# Patient Record
Sex: Male | Born: 1945 | Race: White | Hispanic: No | Marital: Married | Smoking: Never smoker
Health system: Southern US, Community
[De-identification: ages and names within clinical notes are randomized; demographics above are authoritative.]

## PROBLEM LIST (undated history)

## (undated) DIAGNOSIS — M549 Dorsalgia, unspecified: Secondary | ICD-10-CM

## (undated) DIAGNOSIS — M1990 Unspecified osteoarthritis, unspecified site: Secondary | ICD-10-CM

## (undated) DIAGNOSIS — I480 Paroxysmal atrial fibrillation: Secondary | ICD-10-CM

## (undated) DIAGNOSIS — K219 Gastro-esophageal reflux disease without esophagitis: Secondary | ICD-10-CM

## (undated) DIAGNOSIS — E119 Type 2 diabetes mellitus without complications: Secondary | ICD-10-CM

## (undated) DIAGNOSIS — I499 Cardiac arrhythmia, unspecified: Secondary | ICD-10-CM

## (undated) DIAGNOSIS — E785 Hyperlipidemia, unspecified: Secondary | ICD-10-CM

## (undated) DIAGNOSIS — I10 Essential (primary) hypertension: Secondary | ICD-10-CM

## (undated) DIAGNOSIS — C801 Malignant (primary) neoplasm, unspecified: Secondary | ICD-10-CM

## (undated) DIAGNOSIS — R911 Solitary pulmonary nodule: Secondary | ICD-10-CM

## (undated) DIAGNOSIS — G4730 Sleep apnea, unspecified: Secondary | ICD-10-CM

## (undated) DIAGNOSIS — G8929 Other chronic pain: Secondary | ICD-10-CM

## (undated) DIAGNOSIS — R609 Edema, unspecified: Secondary | ICD-10-CM

## (undated) DIAGNOSIS — Z7901 Long term (current) use of anticoagulants: Secondary | ICD-10-CM

## (undated) HISTORY — DX: Long term (current) use of anticoagulants: Z79.01

## (undated) HISTORY — DX: Edema, unspecified: R60.9

## (undated) HISTORY — PX: TONSILLECTOMY: SUR1361

## (undated) HISTORY — DX: Hyperlipidemia, unspecified: E78.5

## (undated) HISTORY — DX: Solitary pulmonary nodule: R91.1

## (undated) HISTORY — DX: Paroxysmal atrial fibrillation: I48.0

## (undated) HISTORY — DX: Essential (primary) hypertension: I10

## (undated) HISTORY — DX: Type 2 diabetes mellitus without complications: E11.9

## (undated) HISTORY — DX: Other chronic pain: G89.29

## (undated) HISTORY — DX: Sleep apnea, unspecified: G47.30

## (undated) HISTORY — DX: Dorsalgia, unspecified: M54.9

---

## 1996-03-06 HISTORY — PX: APPENDECTOMY: SHX54

## 2002-09-23 ENCOUNTER — Encounter: Payer: Self-pay | Admitting: Family Medicine

## 2002-09-23 ENCOUNTER — Inpatient Hospital Stay (HOSPITAL_COMMUNITY): Admission: AD | Admit: 2002-09-23 | Discharge: 2002-09-26 | Payer: Self-pay | Admitting: Family Medicine

## 2002-09-25 ENCOUNTER — Encounter: Payer: Self-pay | Admitting: Family Medicine

## 2004-01-27 ENCOUNTER — Ambulatory Visit: Payer: Self-pay | Admitting: *Deleted

## 2004-03-01 ENCOUNTER — Ambulatory Visit: Payer: Self-pay | Admitting: *Deleted

## 2004-03-29 ENCOUNTER — Ambulatory Visit: Payer: Self-pay | Admitting: *Deleted

## 2004-04-22 ENCOUNTER — Ambulatory Visit: Payer: Self-pay | Admitting: *Deleted

## 2004-05-25 ENCOUNTER — Ambulatory Visit: Payer: Self-pay | Admitting: *Deleted

## 2004-06-24 ENCOUNTER — Ambulatory Visit: Payer: Self-pay | Admitting: *Deleted

## 2004-07-25 ENCOUNTER — Ambulatory Visit: Payer: Self-pay | Admitting: *Deleted

## 2004-08-17 ENCOUNTER — Ambulatory Visit: Payer: Self-pay | Admitting: *Deleted

## 2004-09-28 ENCOUNTER — Ambulatory Visit: Payer: Self-pay | Admitting: *Deleted

## 2004-10-31 ENCOUNTER — Ambulatory Visit: Payer: Self-pay | Admitting: *Deleted

## 2004-11-28 ENCOUNTER — Ambulatory Visit: Payer: Self-pay | Admitting: Cardiology

## 2004-12-26 ENCOUNTER — Ambulatory Visit: Payer: Self-pay | Admitting: *Deleted

## 2005-01-30 ENCOUNTER — Ambulatory Visit: Payer: Self-pay | Admitting: *Deleted

## 2005-02-28 ENCOUNTER — Ambulatory Visit: Payer: Self-pay | Admitting: Internal Medicine

## 2005-04-03 ENCOUNTER — Ambulatory Visit: Payer: Self-pay | Admitting: *Deleted

## 2005-05-16 ENCOUNTER — Ambulatory Visit: Payer: Self-pay | Admitting: Cardiology

## 2005-05-31 ENCOUNTER — Emergency Department (HOSPITAL_COMMUNITY): Admission: EM | Admit: 2005-05-31 | Discharge: 2005-05-31 | Payer: Self-pay | Admitting: Emergency Medicine

## 2005-06-01 ENCOUNTER — Ambulatory Visit: Payer: Self-pay | Admitting: *Deleted

## 2005-06-03 ENCOUNTER — Emergency Department (HOSPITAL_COMMUNITY): Admission: EM | Admit: 2005-06-03 | Discharge: 2005-06-03 | Payer: Self-pay | Admitting: Emergency Medicine

## 2005-06-20 ENCOUNTER — Ambulatory Visit: Payer: Self-pay | Admitting: *Deleted

## 2005-07-06 ENCOUNTER — Ambulatory Visit: Payer: Self-pay | Admitting: *Deleted

## 2005-07-17 ENCOUNTER — Ambulatory Visit: Payer: Self-pay | Admitting: Cardiology

## 2005-08-18 ENCOUNTER — Ambulatory Visit: Payer: Self-pay | Admitting: Cardiology

## 2005-09-12 ENCOUNTER — Ambulatory Visit: Payer: Self-pay | Admitting: Cardiology

## 2005-10-10 ENCOUNTER — Ambulatory Visit: Payer: Self-pay | Admitting: *Deleted

## 2005-11-13 ENCOUNTER — Ambulatory Visit: Payer: Self-pay | Admitting: Cardiology

## 2005-12-15 ENCOUNTER — Ambulatory Visit: Payer: Self-pay | Admitting: Cardiovascular Disease

## 2006-01-02 ENCOUNTER — Emergency Department (HOSPITAL_COMMUNITY): Admission: EM | Admit: 2006-01-02 | Discharge: 2006-01-02 | Payer: Self-pay | Admitting: Emergency Medicine

## 2006-01-03 ENCOUNTER — Ambulatory Visit: Payer: Self-pay | Admitting: Cardiology

## 2006-01-16 ENCOUNTER — Ambulatory Visit: Payer: Self-pay | Admitting: Cardiology

## 2006-02-13 ENCOUNTER — Ambulatory Visit: Payer: Self-pay | Admitting: Cardiology

## 2006-03-13 ENCOUNTER — Ambulatory Visit: Payer: Self-pay | Admitting: Cardiology

## 2006-04-12 ENCOUNTER — Ambulatory Visit: Payer: Self-pay | Admitting: Internal Medicine

## 2006-05-14 ENCOUNTER — Ambulatory Visit: Payer: Self-pay | Admitting: Cardiology

## 2006-06-11 ENCOUNTER — Ambulatory Visit: Payer: Self-pay | Admitting: Internal Medicine

## 2006-07-09 ENCOUNTER — Ambulatory Visit: Payer: Self-pay | Admitting: Cardiology

## 2006-07-30 ENCOUNTER — Encounter: Payer: Self-pay | Admitting: Cardiology

## 2006-07-30 LAB — CONVERTED CEMR LAB
Cholesterol: 113 mg/dL
Triglyceride fasting, serum: 92 mg/dL

## 2006-08-17 ENCOUNTER — Ambulatory Visit: Payer: Self-pay | Admitting: Cardiology

## 2006-09-19 ENCOUNTER — Ambulatory Visit: Payer: Self-pay | Admitting: Cardiology

## 2006-10-03 ENCOUNTER — Ambulatory Visit: Payer: Self-pay | Admitting: Cardiovascular Disease

## 2006-10-30 ENCOUNTER — Ambulatory Visit: Payer: Self-pay | Admitting: Cardiology

## 2006-11-27 ENCOUNTER — Ambulatory Visit: Payer: Self-pay | Admitting: Cardiology

## 2006-12-11 ENCOUNTER — Ambulatory Visit: Payer: Self-pay | Admitting: Cardiovascular Disease

## 2007-01-11 ENCOUNTER — Ambulatory Visit: Payer: Self-pay | Admitting: Cardiovascular Disease

## 2007-02-08 ENCOUNTER — Ambulatory Visit: Payer: Self-pay | Admitting: Cardiology

## 2007-03-11 ENCOUNTER — Ambulatory Visit: Payer: Self-pay | Admitting: Cardiology

## 2007-04-08 ENCOUNTER — Ambulatory Visit: Payer: Self-pay | Admitting: Cardiology

## 2007-04-15 ENCOUNTER — Ambulatory Visit: Payer: Self-pay | Admitting: Cardiology

## 2007-05-13 ENCOUNTER — Ambulatory Visit: Payer: Self-pay | Admitting: Internal Medicine

## 2007-06-10 ENCOUNTER — Ambulatory Visit: Payer: Self-pay | Admitting: Cardiology

## 2007-07-10 ENCOUNTER — Ambulatory Visit: Payer: Self-pay | Admitting: Cardiology

## 2007-08-07 ENCOUNTER — Ambulatory Visit: Payer: Self-pay | Admitting: Cardiology

## 2007-09-05 ENCOUNTER — Ambulatory Visit: Payer: Self-pay | Admitting: Cardiology

## 2007-10-08 ENCOUNTER — Ambulatory Visit: Payer: Self-pay | Admitting: Cardiology

## 2007-11-07 ENCOUNTER — Ambulatory Visit: Payer: Self-pay | Admitting: Cardiology

## 2007-12-05 ENCOUNTER — Ambulatory Visit: Payer: Self-pay | Admitting: Cardiology

## 2008-01-02 ENCOUNTER — Ambulatory Visit: Payer: Self-pay | Admitting: Cardiology

## 2008-02-06 ENCOUNTER — Ambulatory Visit: Payer: Self-pay | Admitting: Cardiology

## 2008-03-10 ENCOUNTER — Ambulatory Visit: Payer: Self-pay | Admitting: Cardiology

## 2008-04-08 ENCOUNTER — Ambulatory Visit: Payer: Self-pay | Admitting: Cardiology

## 2008-05-07 ENCOUNTER — Ambulatory Visit: Payer: Self-pay | Admitting: Cardiology

## 2008-06-04 ENCOUNTER — Ambulatory Visit: Payer: Self-pay | Admitting: Cardiology

## 2008-07-09 ENCOUNTER — Ambulatory Visit: Payer: Self-pay | Admitting: Cardiology

## 2008-07-16 ENCOUNTER — Ambulatory Visit: Payer: Self-pay | Admitting: Cardiology

## 2008-07-24 ENCOUNTER — Encounter: Payer: Self-pay | Admitting: Cardiology

## 2008-07-24 LAB — CONVERTED CEMR LAB
Albumin: 4.4 g/dL
Alkaline Phosphatase: 43 units/L
BUN: 29 mg/dL
Chloride: 104 meq/L
Creatinine, Ser: 1.36 mg/dL
Platelets: 180 10*3/uL
Potassium: 4.2 meq/L

## 2008-08-06 ENCOUNTER — Ambulatory Visit: Payer: Self-pay | Admitting: Cardiology

## 2008-09-03 ENCOUNTER — Ambulatory Visit: Payer: Self-pay | Admitting: Cardiology

## 2008-10-08 ENCOUNTER — Ambulatory Visit: Payer: Self-pay | Admitting: Cardiology

## 2008-10-09 ENCOUNTER — Encounter: Payer: Self-pay | Admitting: Cardiology

## 2008-10-16 ENCOUNTER — Encounter (INDEPENDENT_AMBULATORY_CARE_PROVIDER_SITE_OTHER): Payer: Self-pay | Admitting: *Deleted

## 2008-10-19 ENCOUNTER — Encounter: Payer: Self-pay | Admitting: *Deleted

## 2008-11-12 ENCOUNTER — Ambulatory Visit: Payer: Self-pay

## 2008-11-20 ENCOUNTER — Encounter (INDEPENDENT_AMBULATORY_CARE_PROVIDER_SITE_OTHER): Payer: Self-pay | Admitting: *Deleted

## 2008-12-14 ENCOUNTER — Ambulatory Visit: Payer: Self-pay | Admitting: Cardiology

## 2009-01-11 ENCOUNTER — Ambulatory Visit: Payer: Self-pay | Admitting: Cardiology

## 2009-02-15 ENCOUNTER — Ambulatory Visit: Payer: Self-pay | Admitting: Cardiology

## 2009-02-15 LAB — CONVERTED CEMR LAB: POC INR: 2.4

## 2009-03-18 ENCOUNTER — Ambulatory Visit: Payer: Self-pay | Admitting: Cardiovascular Disease

## 2009-03-18 LAB — CONVERTED CEMR LAB: POC INR: 2

## 2009-04-15 ENCOUNTER — Ambulatory Visit: Payer: Self-pay | Admitting: Cardiology

## 2009-04-15 LAB — CONVERTED CEMR LAB: POC INR: 1.9

## 2009-05-13 ENCOUNTER — Ambulatory Visit: Payer: Self-pay | Admitting: Cardiology

## 2009-05-13 LAB — CONVERTED CEMR LAB: POC INR: 2.4

## 2009-06-08 DIAGNOSIS — R600 Localized edema: Secondary | ICD-10-CM

## 2009-06-13 ENCOUNTER — Encounter: Payer: Self-pay | Admitting: Cardiology

## 2009-06-13 LAB — CONVERTED CEMR LAB: Total Bilirubin: 0.6 mg/dL

## 2009-06-14 ENCOUNTER — Ambulatory Visit: Payer: Self-pay | Admitting: Cardiology

## 2009-06-14 ENCOUNTER — Encounter (INDEPENDENT_AMBULATORY_CARE_PROVIDER_SITE_OTHER): Payer: Self-pay | Admitting: *Deleted

## 2009-06-14 LAB — CONVERTED CEMR LAB: POC INR: 2

## 2009-06-15 ENCOUNTER — Encounter: Payer: Self-pay | Admitting: Cardiology

## 2009-06-21 ENCOUNTER — Encounter: Payer: Self-pay | Admitting: Cardiology

## 2009-06-21 ENCOUNTER — Encounter (INDEPENDENT_AMBULATORY_CARE_PROVIDER_SITE_OTHER): Payer: Self-pay | Admitting: *Deleted

## 2009-06-21 LAB — CONVERTED CEMR LAB
ALT: 19 units/L
ALT: 19 units/L (ref 0–53)
AST: 14 units/L (ref 0–37)
Albumin: 4.4 g/dL (ref 3.5–5.2)
Alkaline Phosphatase: 41 units/L (ref 39–117)
BUN: 29 mg/dL — ABNORMAL HIGH (ref 6–23)
Basophils Absolute: 0 10*3/uL (ref 0.0–0.1)
Calcium: 9.2 mg/dL
Chloride: 107 meq/L
Eosinophils Relative: 2 % (ref 0–5)
HCT: 39.3 % (ref 39.0–52.0)
HDL: 37 mg/dL
HDL: 37 mg/dL — ABNORMAL LOW (ref >39)
Hemoglobin: 13 g/dL
LDL Cholesterol: 42 mg/dL
LDL Cholesterol: 42 mg/dL (ref 0–99)
Lymphocytes Relative: 26 % (ref 12–46)
Lymphs Abs: 1.6 10*3/uL (ref 0.7–4.0)
MCV: 90.6 fL
Neutro Abs: 3.9 10*3/uL (ref 1.7–7.7)
Neutrophils Relative %: 65 % (ref 43–77)
Platelets: 188 10*3/uL (ref 150–400)
Potassium: 4.2 meq/L
Potassium: 4.2 meq/L (ref 3.5–5.3)
RDW: 13.6 % (ref 11.5–15.5)
Total CHOL/HDL Ratio: 2.6
Total Protein: 6.3 g/dL
WBC: 6 10*3/uL
WBC: 6 10*3/uL (ref 4.0–10.5)

## 2009-06-24 ENCOUNTER — Encounter (INDEPENDENT_AMBULATORY_CARE_PROVIDER_SITE_OTHER): Payer: Self-pay | Admitting: *Deleted

## 2009-07-12 ENCOUNTER — Ambulatory Visit: Payer: Self-pay | Admitting: Cardiology

## 2009-07-12 LAB — CONVERTED CEMR LAB: POC INR: 2.5

## 2009-08-11 ENCOUNTER — Ambulatory Visit: Payer: Self-pay | Admitting: Cardiology

## 2009-09-10 ENCOUNTER — Telehealth (INDEPENDENT_AMBULATORY_CARE_PROVIDER_SITE_OTHER): Payer: Self-pay | Admitting: *Deleted

## 2009-09-13 ENCOUNTER — Encounter (INDEPENDENT_AMBULATORY_CARE_PROVIDER_SITE_OTHER): Payer: Self-pay | Admitting: *Deleted

## 2009-09-13 ENCOUNTER — Ambulatory Visit: Payer: Self-pay | Admitting: Cardiology

## 2009-09-14 ENCOUNTER — Encounter (INDEPENDENT_AMBULATORY_CARE_PROVIDER_SITE_OTHER): Payer: Self-pay | Admitting: *Deleted

## 2009-09-14 ENCOUNTER — Ambulatory Visit: Payer: Self-pay | Admitting: Cardiology

## 2009-09-14 ENCOUNTER — Encounter (INDEPENDENT_AMBULATORY_CARE_PROVIDER_SITE_OTHER): Payer: Self-pay

## 2009-10-13 ENCOUNTER — Ambulatory Visit: Payer: Self-pay | Admitting: Cardiology

## 2009-11-10 ENCOUNTER — Ambulatory Visit: Payer: Self-pay | Admitting: Cardiology

## 2009-11-10 LAB — CONVERTED CEMR LAB: POC INR: 2.3

## 2009-12-13 ENCOUNTER — Ambulatory Visit: Payer: Self-pay | Admitting: Orthopedic Surgery

## 2009-12-13 DIAGNOSIS — M7580 Other shoulder lesions, unspecified shoulder: Secondary | ICD-10-CM

## 2009-12-15 ENCOUNTER — Encounter: Payer: Self-pay | Admitting: Orthopedic Surgery

## 2009-12-15 ENCOUNTER — Ambulatory Visit: Payer: Self-pay | Admitting: Cardiology

## 2009-12-15 LAB — CONVERTED CEMR LAB: POC INR: 2.4

## 2009-12-21 ENCOUNTER — Encounter (HOSPITAL_COMMUNITY): Admission: RE | Admit: 2009-12-21 | Discharge: 2010-01-20 | Payer: Self-pay | Admitting: Orthopedic Surgery

## 2009-12-28 ENCOUNTER — Encounter: Payer: Self-pay | Admitting: Orthopedic Surgery

## 2010-01-12 ENCOUNTER — Ambulatory Visit: Payer: Self-pay | Admitting: Cardiology

## 2010-01-12 LAB — CONVERTED CEMR LAB: POC INR: 2.2

## 2010-01-19 ENCOUNTER — Encounter: Payer: Self-pay | Admitting: Orthopedic Surgery

## 2010-02-09 ENCOUNTER — Ambulatory Visit: Payer: Self-pay | Admitting: Cardiovascular Disease

## 2010-02-09 LAB — CONVERTED CEMR LAB: POC INR: 2.7

## 2010-02-15 ENCOUNTER — Ambulatory Visit: Payer: Self-pay | Admitting: Orthopedic Surgery

## 2010-03-09 ENCOUNTER — Ambulatory Visit: Admission: RE | Admit: 2010-03-09 | Discharge: 2010-03-09 | Payer: Self-pay | Source: Home / Self Care

## 2010-03-11 ENCOUNTER — Telehealth (INDEPENDENT_AMBULATORY_CARE_PROVIDER_SITE_OTHER): Payer: Self-pay

## 2010-04-05 NOTE — Miscellaneous (Signed)
Summary: Orders Update: hemoccult  Clinical Lists Changes  Orders: Added new Test order of Hemoccult Cards (Take Home) (Hemoccult Cards) - Signed

## 2010-04-05 NOTE — Medication Information (Signed)
Summary: ccr-lr  Anticoagulant Therapy  Managed by: Vashti Hey, RN Supervising MD: Dietrich Pates MD, Molly Maduro Indication 1: Atrial Fibrillation (ICD-427.31) Lab Used: [facility] [place] Site: [place] INR POC 2.4  Dietary changes: no    Health status changes: no    Bleeding/hemorrhagic complications: no    Recent/future hospitalizations: no    Any changes in medication regimen? no    Recent/future dental: no  Any missed doses?: no       Is patient compliant with meds? yes       Anticoagulation Management History:      The patient is taking warfarin and comes in today for a routine follow up visit.  Negative risk factors for bleeding include an age less than 64 years old.  The bleeding index is 'low risk'.  Negative CHADS2 values include Age > 64 years old.  The start date was 10/07/2002.  Anticoagulation responsible provider: Dietrich Pates MD, Molly Maduro.  INR POC: 2.4.  Cuvette Lot#: [id number].  Exp: 10/11.    Anticoagulation Management Assessment/Plan:      The patient's current anticoagulation dose is Coumadin 5 mg tabs: Take as directed by [facility].  The target INR is 2 - 3.  The next INR is due 06/14/2009.  Anticoagulation instructions were given to patient.  Results were reviewed/authorized by Vashti Hey, RN.  He was notified by Vashti Hey RN.         Prior Anticoagulation Instructions: INR 1.9 Take coumadin 1 1/2 tablets tonight then increase dose to 1 1/2 tablet once daily except 1 tablet on Sundays, Tuesdays and Thursdays  Current Anticoagulation Instructions: INR 2.4 Continue coumadin 7.5mg  once daily except 5mg  on Sundays, Tuesdays and Thursdays

## 2010-04-05 NOTE — Medication Information (Signed)
Summary: ccr-lr  Anticoagulant Therapy  Managed by: Vashti Hey, RN PCP: Dr. Artis Delay Supervising MD: Dietrich Pates MD, Molly Maduro Indication 1: Atrial Fibrillation (ICD-427.31) Lab Used: [facility] [place] Site: [place] INR POC 2.4  Dietary changes: no    Health status changes: no    Bleeding/hemorrhagic complications: no    Recent/future hospitalizations: no    Any changes in medication regimen? no    Recent/future dental: no  Any missed doses?: no       Is patient compliant with meds? yes       Allergies: No Known Drug Allergies  Anticoagulation Management History:      The patient is taking warfarin and comes in today for a routine follow up visit.  Positive risk factors for bleeding include presence of serious comorbidities.  Negative risk factors for bleeding include an age less than 31 years old.  The bleeding index is 'intermediate risk'.  Positive CHADS2 values include History of HTN and History of Diabetes.  Negative CHADS2 values include Age > 39 years old.  The start date was 10/07/2002.  Anticoagulation responsible provider: Dietrich Pates MD, Molly Maduro.  INR POC: 2.4.  Cuvette Lot#: [id number].  Exp: 10/11.    Anticoagulation Management Assessment/Plan:      The patient's current anticoagulation dose is Coumadin 5 mg tabs: Take as directed by [facility] Clinic.  The target INR is 2 - 3.  The next INR is due 01/12/2010.  Anticoagulation instructions were given to patient.  Results were reviewed/authorized by Vashti Hey, RN.  He was notified by Vashti Hey RN.         Prior Anticoagulation Instructions: INR 2.3 Continue coumadin 7.5mg  once daily except 5mg  on Sundays, Tuesdays and Thursdays  Current Anticoagulation Instructions: INR 2.4 Continue coumadin 7.5mg  once daily except 5mg  on Sundays, Tuesdays and Thursdays

## 2010-04-05 NOTE — Miscellaneous (Signed)
Summary: labs cbcd,cmp,lipids,06/21/2009  Clinical Lists Changes  Observations: Added new observation of CALCIUM: 9.2 mg/dL (21/30/8657 84:69) Added new observation of ALBUMIN: 4.4 g/dL (62/95/2841 32:44) Added new observation of PROTEIN, TOT: 6.3 g/dL (03/08/7251 66:44) Added new observation of SGPT (ALT): 19 units/L (06/21/2009 16:12) Added new observation of SGOT (AST): 14 units/L (06/21/2009 16:12) Added new observation of ALK PHOS: 41 units/L (06/21/2009 16:12) Added new observation of CREATININE: 1.13 mg/dL (03/47/4259 56:38) Added new observation of BUN: 29 mg/dL (75/64/3329 51:88) Added new observation of BG RANDOM: 115 mg/dL (41/66/0630 16:01) Added new observation of CO2 PLSM/SER: 25 meq/L (06/21/2009 16:12) Added new observation of CL SERUM: 107 meq/L (06/21/2009 16:12) Added new observation of K SERUM: 4.2 meq/L (06/21/2009 16:12) Added new observation of NA: 142 meq/L (06/21/2009 16:12) Added new observation of LDL: 42 mg/dL (09/32/3557 32:20) Added new observation of HDL: 37 mg/dL (25/42/7062 37:62) Added new observation of TRIGLYC TOT: 93 mg/dL (83/15/1761 60:73) Added new observation of CHOLESTEROL: 98 mg/dL (71/08/2692 85:46) Added new observation of PLATELETK/UL: 188 K/uL (06/21/2009 16:12) Added new observation of MCV: 90.6 fL (06/21/2009 16:12) Added new observation of HCT: 39.3 % (06/21/2009 16:12) Added new observation of HGB: 13.0 g/dL (27/05/5007 38:18) Added new observation of WBC COUNT: 6.0 10*3/microliter (06/21/2009 16:12)

## 2010-04-05 NOTE — Assessment & Plan Note (Signed)
Summary: 1 YR F/U PER CKOUT 05/07/08-DSF  Medications Added DOXAZOSIN MESYLATE 4 MG TABS (DOXAZOSIN MESYLATE) take 3 tabs 1 time daily SIMVASTATIN 40 MG TABS (SIMVASTATIN) take 1 tab daily FUROSEMIDE 40 MG TABS (FUROSEMIDE) take 1 tab daily GLYBURIDE-METFORMIN 2.5-500 MG TABS (GLYBURIDE-METFORMIN) take 1 tab two times a day RANITIDINE HCL 150 MG CAPS (RANITIDINE HCL) take 1 tab two times a day LISINOPRIL 40 MG TABS (LISINOPRIL) Take one tablet by mouth daily HYDROCHLOROTHIAZIDE 25 MG TABS (HYDROCHLOROTHIAZIDE) Take one tablet by mouth daily. POTASSIUM CHLORIDE CRYS CR 20 MEQ CR-TABS (POTASSIUM CHLORIDE CRYS CR) Take 1 tablet by mouth once a day      Allergies Added: NKDA  Visit Type:  Follow-up Primary Provider:  Dr. Artis [name]   History of Present Illness: [name] returns the office as scheduled for continued assessment and treatment of paroxysmal atrial fibrillation, hypertension and hyperlipidemia.  Since his last visit, he has done generally well.  He developed an episode of sinusitis during which palpitations were more troublesome.  Generally, he experiences a few episodes a week of irregular cardiac action, none lasting more than 45 minutes.  Prior to treatment, spells lasted for more than 15 hours.  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  Edema remains in good control.  EKG  Procedure date:  06/14/2009  Findings:      Rhythm Strip  Normal sinus rhythm at a rate of 72 bpm Few PACs   Current Medications (verified): 1)  Tekturna 150 Mg Tabs (Aliskiren Fumarate) .... Take One Tablet By Mouth Daily 2)  Coumadin 5 Mg Tabs (Warfarin Sodium) .... Take As Directed By Coumadin Clinic 3)  Dilacor Xr 180 Mg Xr24h-Cap (Diltiazem Hcl) .... Take 1 Tab Daily 4)  Doxazosin Mesylate 4 Mg Tabs (Doxazosin Mesylate) .... Take 3 Tabs 1 Time Daily 5)  Simvastatin 40 Mg Tabs (Simvastatin) .... Take 1 Tab Daily 6)  Furosemide 40 Mg Tabs  (Furosemide) .... Take 1 Tab Daily 7)  Glyburide-Metformin 2.5-500 Mg Tabs (Glyburide-Metformin) .... Take 1 Tab Two Times A Day 8)  Ranitidine Hcl 150 Mg Caps (Ranitidine Hcl) .... Take 1 Tab Two Times A Day 9)  Lisinopril 40 Mg Tabs (Lisinopril) .... Take One Tablet By Mouth Daily 10)  Hydrochlorothiazide 25 Mg Tabs (Hydrochlorothiazide) .... Take One Tablet By Mouth Daily. 11)  Potassium Chloride Crys Cr 20 Meq Cr-Tabs (Potassium Chloride Crys Cr) .... Take 1 Tablet By Mouth Once A Day  Allergies (verified): No Known Drug Allergies  Review of Systems       The patient complains of peripheral edema.  The patient denies weight loss, weight gain, vision loss, decreased hearing, hoarseness, chest pain, syncope, dyspnea on exertion, prolonged cough, headaches, hemoptysis, and abdominal pain.    Vital Signs:  Patient profile:   65 year old male Height:      72 inches Weight:      232 pounds BMI:     31.58 Pulse rate:   60 / minute BP sitting:   136 / 77  (right arm)  Vitals Entered By: Riley Saa, CNA (June 14, 2009 1:46 PM)  Physical Exam  General:  A very pleasant gentleman in no acute distress. Weight is 232, unchanged from his previous visit. Neck: no jjugular venous distention; no carotid bruits. LUNGS:  Clear. CARDIAC:  Normal first and second heart sounds; fourth heart sound present. ABDOMEN:  Normal bowel sounds; soft and nontender. EXTREMITIES:  Chronic stasis  changes and hyperpigmentation in the skin; 1/2+ edema on the right    Impression & Recommendations:  Problem # 1:  ANTICOAGULATION (ICD-V58.61) INRs remained stable and therapeutic.  The magnitude of his risk for thromboembolism is not entirely clear.  Since he has had absolutely no problems with anticoagulation, the safest course appears to be continuation.  A CBC and stool for Hemoccult testing will be obtained to monitor this therapy.  Patient reports that he has never undergone colonoscopy-I suggested he  discuss this with Dr. Sherwood [name] at his next visit.  Problem # 2:  EDEMA (ICD-782.3) Edema is well controlled with modest doses of furosemide.  A chemistry profile will be obtained to monitor this therapy.  Problem # 3:  ATRIAL FIBRILLATION (ICD-427.31) Episodic arrhythmia continues according to the patient's symptoms.  It does not appear worthwhile to perform event recording for documentation.  Since symptoms do not occur every day, a Holter recording may have limited applicability.  For now, anticoagulation and a heart rate control strategy will be pursued.  I will plan to see this nice gentleman again in one year.  Problem # 4:  Sinus Bradycardia This issue has resolved with adjustment of his medical therapy.  Other Orders: Future Orders: T-CBC w/Diff ([id number]) ... 06/21/2009 T-Lipid Profile [id number]) ... 06/21/2009 T-Comprehensive Metabolic Panel [id number]) ... 06/21/2009  Patient Instructions: 1)  Your physician recommends that you schedule a follow-up appointment in: 1 YEAR 2)  Your physician recommends that you return for lab work in: NEXT WEEK 3)  Your physician has asked that you test your stool for blood. It is necessary to test 3 different stool specimens for accuracy. You will be given 3 hemoccult cards for specimen collection. For each stool specimen, place a small portion of stool sample (from 2 different areas of the stool) into the 2 squares on the card. Close card. Repeat with 2 more stool specimens. Bring the cards back to the office for testing.

## 2010-04-05 NOTE — Medication Information (Signed)
Summary: ccr-lr  Anticoagulant Therapy  Managed by: Vashti Hey, RN PCP: Dr. Artis Delay Supervising MD: Diona Browner MD, Remi Deter Indication 1: Atrial Fibrillation (ICD-427.31) Lab Used: [facility] [place] Site: [place] INR POC 3.1  Dietary changes: no    Health status changes: no    Bleeding/hemorrhagic complications: no    Recent/future hospitalizations: no    Any changes in medication regimen? no    Recent/future dental: no  Any missed doses?: no       Is patient compliant with meds? yes       Allergies: No Known Drug Allergies  Anticoagulation Management History:      The patient is taking warfarin and comes in today for a routine follow up visit.  Positive risk factors for bleeding include presence of serious comorbidities.  Negative risk factors for bleeding include an age less than 31 years old.  The bleeding index is 'intermediate risk'.  Positive CHADS2 values include History of HTN and History of Diabetes.  Negative CHADS2 values include Age > 79 years old.  The start date was 10/07/2002.  Anticoagulation responsible Stiven Kaspar: Diona Browner MD, Remi Deter.  INR POC: 3.1.  Cuvette Lot#: [id number].  Exp: 10/11.    Anticoagulation Management Assessment/Plan:      The patient's current anticoagulation dose is Coumadin 5 mg tabs: Take as directed by Coumadin Clinic.  The target INR is 2 - 3.  The next INR is due 11/10/2009.  Anticoagulation instructions were given to patient.  Results were reviewed/authorized by Vashti Hey, RN.  He was notified by Vashti Hey RN.         Prior Anticoagulation Instructions: INR 2.5 Continue coumadin 7.5mg  once daily except 5mg  on Sundays, Tuesdays and Thursdays  Current Anticoagulation Instructions: INR 3.1 Take coumadin 5mg  tonight then resume 7.5mg  once daily except 5mg  on Sundays, Tuesdays and Thursdays

## 2010-04-05 NOTE — Assessment & Plan Note (Signed)
Summary: rt shoulder pain needs xr/uhc/bsf   Vital Signs:  Patient profile:   65 year old male Height:      73 inches Weight:      234 pounds Pulse rate:   76 / minute Resp:     16 per minute  Vitals Entered By: Fuller Canada MD (December 13, 2009 9:12 AM)  Visit Type:  new patient Referring Provider:  Dr. Sherwood Gambler Primary Provider:  Dr. Artis Delay  CC:  right shoulder pain.  History of Present Illness: I saw Riley [name] in the office today for an initial visit.  He is a 65 years old man with the complaint of: right shoulder pain.  Xrays today.  No injury.  Meds: in EMR.  65 year old male with pain in his RIGHT upper arm and shoulder area for 3 months no apparent trauma.  I initially treated with anti-inflammatories for 6 weeks using Naprosyn 500 twice a day which did give him some relief he also received a shoulder injection which helped as well but he has plateaued in terms of his pain relief.  Right now he has stiffness in external rotation and pain with terminal forward elevation and some mild weakness mainly around the deltoid area.  His pain level is 6/10 in the symptoms seem to come and go.  Initially he had trouble sleeping on his RIGHT side but not any more.  He did not have any physical therapy.  Allergies (verified): No Known Drug Allergies  Past History:  Past Surgical History: Appendectomy tonsils  Family History: Family History Coronary Heart Disease male < 76 Family History of Arthritis  Social History: Full Time Arboriculturist Married  Tobacco Use - No.  Alcohol Use - no Regular Exercise - no Drug Use - no no caffeine 12 th grade ed  Review of Systems Constitutional:  Denies weight loss, weight gain, fever, chills, and fatigue. Cardiovascular:  Denies chest pain, palpitations, fainting, and murmurs; a fib. Respiratory:  Complains of snoring; denies short of breath, wheezing, couch, tightness, pain on inspiration, and snoring  . Gastrointestinal:  Denies heartburn, nausea, vomiting, diarrhea, constipation, and blood in your stools. Genitourinary:  Denies frequency, urgency, difficulty urinating, painful urination, flank pain, and bleeding in urine. Neurologic:  Denies numbness, tingling, unsteady gait, dizziness, tremors, and seizure. Musculoskeletal:  Complains of joint pain and stiffness; denies swelling, instability, redness, heat, and muscle pain. Endocrine:  Denies excessive thirst, exessive urination, and heat or cold intolerance. Psychiatric:  Denies nervousness, depression, anxiety, and hallucinations. Skin:  Denies changes in the skin, poor healing, rash, itching, and redness. HEENT:  Denies blurred or double vision, eye pain, redness, and watering. Immunology:  Complains of seasonal allergies; denies sinus problems and allergic to bee stings. Hemoatologic:  Complains of easy bleeding and brusing; on coumadin.  Physical Exam  Additional Exam:  GEN: well developed, well nourished, normal grooming and hygiene, no deformity and normal body habitus.  vital signs are stable as recorded CDV: pulses are normal, no edema, no erythema. no tenderness  Lymph: normal lymph nodes   Skin: no rashes, skin lesions or open sores   NEURO: normal coordination, sensation; reflexes are zero at the elbow and wrist bilaterally and equal   Psyche: awake, alert and oriented. Mood normal   Gait: normal  RIGHT shoulder no specific tenderness over the a.c. joint or posterior subacromial space or anterolateral acromion no swelling no warmth.  Range of motion: Flexion active 150 vs. LEFT side 180.  External rotation 30 vs.  50 on the LEFT.  Internal rotation lumbar level vs. thoracic level on the LEFT.  Motor exam: Mild weakness in the supraspinatus on the RIGHT normal on the LEFT  Both shoulders are stable  Provocative tests for Neer sign was positive on the RIGHT at 120.  Neck nontender.     Impression &  Recommendations:  Problem # 1:  IMPINGEMENT SYNDROME (ICD-726.2) Assessment New  I did go ahead and inject the subacromial space first injection not sure if it was subacromial or if it was intramuscular and notes and the notes are not clear;  we do have indicated that he did get anti-inflammatories and he was followed up for her second visit and then referred here.  He did not get any physical therapy  Radiographs 2 views of the RIGHT shoulder AP and lateral  Findings slight glenohumeral joint space narrowing, type II acromion.  Impression normal glenohumeral joint  Orders: New Patient Level III (04540) Shoulder x-ray,  minimum 2 views (98119) Joint Aspirate / Injection, Large (20610) Depo- Medrol 40mg  (J1030)  Patient Instructions: 1)  please go to physical therapy for the next 6 weeks.  We've arranged a followup for you for 8 weeks. 2)  We have received a cortisone injection in the subacromial space. 3)  You have received an injection of cortisone today. You may experience increased pain at the injection site. Apply ice pack to the area for 20 minutes every 2 hours and take 2 xtra strength tylenol every 8 hours. This increased pain will usually resolve in 24 hours. The injection will take effect in 3-10 days.

## 2010-04-05 NOTE — Letter (Signed)
Summary: History form  History form   Imported By: Jacklynn Ganong 12/15/2009 08:33:52  _____________________________________________________________________  External Attachment:    Type:   Image     Comment:   External Document

## 2010-04-05 NOTE — Progress Notes (Signed)
Summary: diltiazem refill  Medications Added DILTIAZEM HCL ER BEADS 180 MG XR24H-CAP (DILTIAZEM HCL ER BEADS) Take one capsule by mouth daily       Phone Note Call from Patient Call back at Home Phone [phone]   Caller: PT Reason for Call: Talk to Nurse Summary of Call: PT WAS ON 180MG  OF CARDIZEM AND NOW IT WAS CALLED IN FOR 90MG , PT WAS UNAWARE OF THE CHANGE IN MEDS AND JUST WANTS TO BE SURE THIS IS CORRECT. Initial call taken by: Faythe Ghee,  September 10, 2009 12:06 PM    New/Updated Medications: DILTIAZEM HCL ER BEADS 180 MG XR24H-CAP (DILTIAZEM HCL ER BEADS) Take one capsule by mouth daily Prescriptions: DILTIAZEM HCL ER BEADS 180 MG XR24H-CAP (DILTIAZEM HCL ER BEADS) Take one capsule by mouth daily  #30 x 6   Entered by:   Teressa Lower RN   Authorized by:   Kathlen Brunswick, MD, [facility]   Signed by:   Teressa Lower RN on 09/10/2009   Method used:   Electronically to        [facility] (retail)       [address]       [place], [place]  [address]       Ph: [phone]       Fax: [phone]   RxID:   [id number]

## 2010-04-05 NOTE — Miscellaneous (Signed)
Summary: hemoccult cards 09/13/2009  Clinical Lists Changes  Observations: Added new observation of HEMOCCULT 3: pos (09/13/2009 8:36) Added new observation of HEMOCCULT 2: neg (09/13/2009 8:36) Added new observation of HEMOCCULT 1: neg (09/13/2009 8:36)   I sent pt a new set of stool cards for pt to redo test   Teressa Lower RN  September 14, 2009 4:07 PM

## 2010-04-05 NOTE — Miscellaneous (Signed)
Summary: OT Discharge summary  OT Discharge summary   Imported By: Jacklynn Ganong 01/19/2010 08:37:11  _____________________________________________________________________  External Attachment:    Type:   Image     Comment:   External Document

## 2010-04-05 NOTE — Medication Information (Signed)
Summary: ccr at Riley [name] appt -lr  Anticoagulant Therapy  Managed by: Vashti Hey, RN PCP: Dr. Artis Delay Supervising MD: Dietrich Pates MD, Molly Maduro Indication 1: Atrial Fibrillation (ICD-427.31) Lab Used: [facility] [place] Site: [place] INR POC 2.0  Dietary changes: no    Health status changes: no    Bleeding/hemorrhagic complications: no    Recent/future hospitalizations: no    Any changes in medication regimen? no    Recent/future dental: no  Any missed doses?: no       Is patient compliant with meds? yes       Allergies: No Known Drug Allergies  Anticoagulation Management History:      The patient is taking warfarin and comes in today for a routine follow up visit.  Positive risk factors for bleeding include presence of serious comorbidities.  Negative risk factors for bleeding include an age less than 31 years old.  The bleeding index is 'intermediate risk'.  Positive CHADS2 values include History of HTN and History of Diabetes.  Negative CHADS2 values include Age > 30 years old.  The start date was 10/07/2002.  Anticoagulation responsible provider: Dietrich Pates MD, Molly Maduro.  INR POC: 2.0.  Cuvette Lot#: [id number].  Exp: 10/11.    Anticoagulation Management Assessment/Plan:      The patient's current anticoagulation dose is Coumadin 5 mg tabs: Take as directed by Coumadin Clinic.  The target INR is 2 - 3.  The next INR is due 07/12/2009.  Anticoagulation instructions were given to patient.  Results were reviewed/authorized by Vashti Hey, RN.  He was notified by Vashti Hey RN.         Prior Anticoagulation Instructions: INR 2.4 Continue coumadin 7.5mg  once daily except 5mg  on Sundays, Tuesdays and Thursdays  Current Anticoagulation Instructions: INR 2.0 Take coumadin 2 tablets tonight then resume 1 1/2 tablets once daily except 1 tablet on Sundays, Tuesdays and Thursdays

## 2010-04-05 NOTE — Medication Information (Signed)
Summary: ccr-lr  Anticoagulant Therapy  Managed by: Vashti Hey, RN PCP: Dr. Artis Delay Supervising MD: Dietrich Pates MD, Molly Maduro Indication 1: Atrial Fibrillation (ICD-427.31) Lab Used: [facility] [place] Site: [place] INR POC 2.2  Dietary changes: no    Health status changes: no    Bleeding/hemorrhagic complications: no    Recent/future hospitalizations: no    Any changes in medication regimen? no    Recent/future dental: no  Any missed doses?: no       Is patient compliant with meds? yes       Allergies: No Known Drug Allergies  Anticoagulation Management History:      The patient is taking warfarin and comes in today for a routine follow up visit.  Positive risk factors for bleeding include presence of serious comorbidities.  Negative risk factors for bleeding include an age less than 18 years old.  The bleeding index is 'intermediate risk'.  Positive CHADS2 values include History of HTN and History of Diabetes.  Negative CHADS2 values include Age > 48 years old.  The start date was 10/07/2002.  Anticoagulation responsible Jlee Harkless: Dietrich Pates MD, Molly Maduro.  INR POC: 2.2.  Cuvette Lot#: [id number].  Exp: 10/11.    Anticoagulation Management Assessment/Plan:      The patient's current anticoagulation dose is Coumadin 5 mg tabs: 7.5mg  once daily except 5mg  on Sundays, Tuesdays and Thursdays or as directed by anticoagulation clinic.  The target INR is 2 - 3.  The next INR is due 02/09/2010.  Anticoagulation instructions were given to patient.  Results were reviewed/authorized by Vashti Hey, RN.  He was notified by Vashti Hey RN.         Prior Anticoagulation Instructions: INR 2.4 Continue coumadin 7.5mg  once daily except 5mg  on Sundays, Tuesdays and Thursdays  Current Anticoagulation Instructions: INR 2.2 Continue coumadin 7.5mg  once daily except 5mg  on Sundays, Tuesdays and Thursdays

## 2010-04-05 NOTE — Medication Information (Signed)
Summary: ccr-lr  Anticoagulant Therapy  Managed by: Vashti Hey, RN PCP: Dr. Artis Delay Supervising MD: Dietrich Pates MD, Molly Maduro Indication 1: Atrial Fibrillation (ICD-427.31) Lab Used: [facility]  Site: [place] INR POC 2.3  Dietary changes: no    Health status changes: no    Bleeding/hemorrhagic complications: no    Recent/future hospitalizations: no    Any changes in medication regimen? no    Recent/future dental: no  Any missed doses?: no       Is patient compliant with meds? yes       Allergies: No Known Drug Allergies  Anticoagulation Management History:      The patient is taking warfarin and comes in today for a routine follow up visit.  Positive risk factors for bleeding include presence of serious comorbidities.  Negative risk factors for bleeding include an age less than 51 years old.  The bleeding index is 'intermediate risk'.  Positive CHADS2 values include History of HTN and History of Diabetes.  Negative CHADS2 values include Age > 32 years old.  The start date was 10/07/2002.  Anticoagulation responsible provider: Dietrich Pates MD, Molly Maduro.  INR POC: 2.3.  Cuvette Lot#: [id number].  Exp: 10/11.    Anticoagulation Management Assessment/Plan:      The patient's current anticoagulation dose is Coumadin 5 mg tabs: Take as directed by [facility] Clinic.  The target INR is 2 - 3.  The next INR is due 12/15/2009.  Anticoagulation instructions were given to patient.  Results were reviewed/authorized by Vashti Hey, RN.  He was notified by Vashti Hey RN.         Prior Anticoagulation Instructions: INR 3.1 Take coumadin 5mg  tonight then resume 7.5mg  once daily except 5mg  on Sundays, Tuesdays and Thursdays   Current Anticoagulation Instructions: INR 2.3 Continue coumadin 7.5mg  once daily except 5mg  on Sundays, Tuesdays and Thursdays

## 2010-04-05 NOTE — Medication Information (Signed)
Summary: ccr-lr  Anticoagulant Therapy  Managed by: Vashti Hey, RN PCP: Dr. Artis Delay Supervising MD: Eden Emms MD, Theron Arista Indication 1: Atrial Fibrillation (ICD-427.31) Lab Used: [facility] [place] Site: [place] INR POC 2.7  Dietary changes: no    Health status changes: no    Bleeding/hemorrhagic complications: no    Recent/future hospitalizations: no    Any changes in medication regimen? no    Recent/future dental: no  Any missed doses?: no       Is patient compliant with meds? yes       Allergies: No Known Drug Allergies  Anticoagulation Management History:      The patient is taking warfarin and comes in today for a routine follow up visit.  Positive risk factors for bleeding include presence of serious comorbidities.  Negative risk factors for bleeding include an age less than 48 years old.  The bleeding index is 'intermediate risk'.  Positive CHADS2 values include History of HTN and History of Diabetes.  Negative CHADS2 values include Age > 55 years old.  The start date was 10/07/2002.  Anticoagulation responsible provider: Eden Emms MD, Theron Arista.  INR POC: 2.7.  Cuvette Lot#: [id number].  Exp: 10/11.    Anticoagulation Management Assessment/Plan:      The patient's current anticoagulation dose is Coumadin 5 mg tabs: 7.5mg  once daily except 5mg  on Sundays, Tuesdays and Thursdays or as directed by anticoagulation clinic.  The target INR is 2 - 3.  The next INR is due 03/09/2010.  Anticoagulation instructions were given to patient.  Results were reviewed/authorized by Vashti Hey, RN.  He was notified by Vashti Hey RN.         Prior Anticoagulation Instructions: INR 2.2 Continue coumadin 7.5mg  once daily except 5mg  on Sundays, Tuesdays and Thursdays  Current Anticoagulation Instructions: INR 2.7 Continue coumadin 7.5mg  once daily except 5mg  on Sundays, Tuesdays and Thursdays

## 2010-04-05 NOTE — Letter (Signed)
Summary: [place] Results Engineer, agricultural at [facility]  [address], [place] [address]   Phone: [phone]  Fax: [phone]      September 14, 2009 MRN: [id number]   GLENNON KOPKO [address] [place] [address], [place]  [address]   Dear Mr. JENNIGES,  Your test ordered by Selena Batten has been reviewed by your physician (or physician assistant) and was found to be normal or stable. Your physician (or physician assistant) felt no changes were needed at this time.  ____ Echocardiogram  ____ Cardiac Stress Test  ____ Lab Work  ____ Peripheral vascular study of arms, legs or neck  ____ CT scan or X-ray  ____ Lung or Breathing test  __X__ Other: STOOL CARDS  Please repeat your stool cards.  Please do not eat the foods on the list provided. One of your cards was positive for blood and we need you to repeat the test again, per Dr. Dietrich Pates.  Thank you, Tammy Allyne Gee RN    Evergreen Bing, MD, Lenise Arena.C.Gaylord Shih, MD, F.A.C.C Lewayne Bunting, MD, F.A.C.C Nona Dell, MD, F.A.C.C Charlton Haws, MD, Lenise Arena.C.C

## 2010-04-05 NOTE — Letter (Signed)
Summary: [place] [employer] [facility] Work Engineer, agricultural at [employer]  [address] [place] [address]   Phone: [phone]  Fax: [phone]     June 14, 2009 MRN: [id number]   Riley [name] [address] [place] [address] [place]  [address]      YOUR LAB WORK IS DUE  June 21, 2009 _________________________________________  Please go to [facility], located across the street from [facility] on the second floor.  Hours are Monday - Friday 7am until 7:30pm         Saturday 8am until 12noon    _X_  DO NOT EAT OR DRINK AFTER MIDNIGHT EVENING PRIOR TO LABWORK  __ YOUR LABWORK IS NOT FASTING --YOU MAY EAT PRIOR TO LABWORK

## 2010-04-05 NOTE — Medication Information (Signed)
Summary: ccr-lr  Anticoagulant Therapy  Managed by: Vashti Hey, RN PCP: Dr. Artis Delay Supervising MD: Dietrich Pates MD, Molly Maduro Indication 1: Atrial Fibrillation (ICD-427.31) Lab Used: [facility] [place] Site: [place] INR POC 2.5  Dietary changes: no    Health status changes: no    Bleeding/hemorrhagic complications: no    Recent/future hospitalizations: no    Any changes in medication regimen? no    Recent/future dental: no  Any missed doses?: no       Is patient compliant with meds? yes       Allergies: No Known Drug Allergies  Anticoagulation Management History:      The patient is taking warfarin and comes in today for a routine follow up visit.  Positive risk factors for bleeding include presence of serious comorbidities.  Negative risk factors for bleeding include an age less than 24 years old.  The bleeding index is 'intermediate risk'.  Positive CHADS2 values include History of HTN and History of Diabetes.  Negative CHADS2 values include Age > 46 years old.  The start date was 10/07/2002.  Anticoagulation responsible provider: Dietrich Pates MD, Molly Maduro.  INR POC: 2.5.  Cuvette Lot#: [id number].  Exp: 10/11.    Anticoagulation Management Assessment/Plan:      The patient's current anticoagulation dose is Coumadin 5 mg tabs: Take as directed by [facility]in Clinic.  The target INR is 2 - 3.  The next INR is due 08/11/2009.  Anticoagulation instructions were given to patient.  Results were reviewed/authorized by Vashti Hey, RN.  He was notified by Vashti Hey RN.         Prior Anticoagulation Instructions: INR 2.0 Take coumadin 2 tablets tonight then resume 1 1/2 tablets once daily except 1 tablet on Sundays, Tuesdays and Thursdays  Current Anticoagulation Instructions: INR 2.5 Continue coumadin 7.5mg  once daily except 5mg  on Sundays, Tuesdays and Thursdays

## 2010-04-05 NOTE — Medication Information (Signed)
Summary: ccr-lr  Anticoagulant Therapy  Managed by: Vashti Hey, RN Supervising MD: Eden Emms MD, Theron Arista Indication 1: Atrial Fibrillation (ICD-427.31) Lab Used: [facility] [place] Site: [place] INR POC 2.0  Dietary changes: no    Health status changes: no    Bleeding/hemorrhagic complications: no    Recent/future hospitalizations: no    Any changes in medication regimen? no    Recent/future dental: no  Any missed doses?: no       Is patient compliant with meds? yes       Anticoagulation Management History:      The patient is taking warfarin and comes in today for a routine follow up visit.  Negative risk factors for bleeding include an age less than 29 years old.  The bleeding index is 'low risk'.  Negative CHADS2 values include Age > 38 years old.  The start date was 10/07/2002.  Anticoagulation responsible provider: Eden Emms MD, Theron Arista.  INR POC: 2.0.  Cuvette Lot#: [id number].  Exp: 10/11.    Anticoagulation Management Assessment/Plan:      The patient's current anticoagulation dose is Coumadin 5 mg tabs: Take as directed by Coumadin Clinic.  The target INR is 2 - 3.  The next INR is due 04/15/2009.  Anticoagulation instructions were given to patient.  Results were reviewed/authorized by Vashti Hey, RN.  He was notified by Vashti Hey RN.         Prior Anticoagulation Instructions: INR 2.4 Continue coumadin 5mg  once daily except 7.5mg  on Mondays, Wednesdays and Fridays  Current Anticoagulation Instructions: INR 2.0 Take coumadin 1 1/2 tablets tonight then resume 1 tablet once daily except 1 12/ tablets on Mondays, Wednesdays and Fridays

## 2010-04-05 NOTE — Medication Information (Signed)
Summary: ccr-lr  Anticoagulant Therapy  Managed by: Vashti Hey, RN PCP: Dr. Artis Delay Supervising MD: Dietrich Pates MD, Molly Maduro Indication 1: Atrial Fibrillation (ICD-427.31) Lab Used: [facility] [place] Site: [place] INR POC 2.5  Dietary changes: no    Health status changes: no    Bleeding/hemorrhagic complications: no    Recent/future hospitalizations: no    Any changes in medication regimen? no    Recent/future dental: no  Any missed doses?: no       Is patient compliant with meds? yes       Allergies: No Known Drug Allergies  Anticoagulation Management History:      The patient is taking warfarin and comes in today for a routine follow up visit.  Positive risk factors for bleeding include presence of serious comorbidities.  Negative risk factors for bleeding include an age less than 63 years old.  The bleeding index is 'intermediate risk'.  Positive CHADS2 values include History of HTN and History of Diabetes.  Negative CHADS2 values include Age > 60 years old.  The start date was 10/07/2002.  Anticoagulation responsible provider: Dietrich Pates MD, Molly Maduro.  INR POC: 2.5.  Exp: 10/11.    Anticoagulation Management Assessment/Plan:      The patient's current anticoagulation dose is Coumadin 5 mg tabs: Take as directed by Coumadin Clinic.  The target INR is 2 - 3.  The next INR is due 10/13/2009.  Anticoagulation instructions were given to patient.  Results were reviewed/authorized by Vashti Hey, RN.  He was notified by Vashti Hey RN.         Prior Anticoagulation Instructions: INR 2.6 Continue coumadin 7.5mg  once daily except 5mg  on Sundays, Tuesdays and Thursdays  Current Anticoagulation Instructions: INR 2.5 Continue coumadin 7.5mg  once daily except 5mg  on Sundays, Tuesdays and Thursdays

## 2010-04-05 NOTE — Medication Information (Signed)
Summary: ccr-lr  Anticoagulant Therapy  Managed by: Vashti Hey, RN PCP: Dr. Artis Delay Supervising MD: Diona Browner MD, Remi Deter Indication 1: Atrial Fibrillation (ICD-427.31) Lab Used: [facility] [place] Site: [place] INR POC 2.6  Dietary changes: no    Health status changes: no    Bleeding/hemorrhagic complications: no    Recent/future hospitalizations: no    Any changes in medication regimen? no    Recent/future dental: no  Any missed doses?: no       Is patient compliant with meds? yes       Allergies: No Known Drug Allergies  Anticoagulation Management History:      The patient is taking warfarin and comes in today for a routine follow up visit.  Positive risk factors for bleeding include presence of serious comorbidities.  Negative risk factors for bleeding include an age less than 64 years old.  The bleeding index is 'intermediate risk'.  Positive CHADS2 values include History of HTN and History of Diabetes.  Negative CHADS2 values include Age > 64 years old.  The start date was 10/07/2002.  Anticoagulation responsible provider: Diona Browner MD, Remi Deter.  INR POC: 2.6.  Cuvette Lot#: [id number].  Exp: 10/11.    Anticoagulation Management Assessment/Plan:      The patient's current anticoagulation dose is Coumadin 5 mg tabs: Take as directed by Coumadin Clinic.  The target INR is 2 - 3.  The next INR is due 09/13/2009.  Anticoagulation instructions were given to patient.  Results were reviewed/authorized by Vashti Hey, RN.  He was notified by Vashti Hey RN.         Prior Anticoagulation Instructions: INR 2.5 Continue coumadin 7.5mg  once daily except 5mg  on Sundays, Tuesdays and Thursdays  Current Anticoagulation Instructions: INR 2.6 Continue coumadin 7.5mg  once daily except 5mg  on Sundays, Tuesdays and Thursdays

## 2010-04-05 NOTE — Letter (Signed)
Summary: [place] Results Engineer, agricultural at [facility]  [address], [place] [address]   Phone: [phone]  Fax: [phone]      June 24, 2009 MRN: [id number]   Riley [name] [address] [place] [address] [place]  [address]   Dear Riley [name],  Your test ordered by Selena Batten has been reviewed by your physician (or physician assistant) and was found to be normal or stable. Your physician (or physician assistant) felt no changes were needed at this time.  ____ Echocardiogram  ____ Cardiac Stress Test  _x__ Lab Work  ____ Peripheral vascular study of arms, legs or neck  ____ CT scan or X-ray  ____ Lung or Breathing test  ____ Other: No change in medical treatment at this time, per Dr. Dietrich Pates.  Enclosed is a copy of your labwork for your records.   Thank you, Estevon Fluke Allyne Gee RN    [place] Bing, MD, Lenise Arena.C.Gaylord Shih, MD, F.A.C.C Lewayne Bunting, MD, F.A.C.C Nona Dell, MD, F.A.C.C Charlton Haws, MD, Lenise Arena.C.C

## 2010-04-05 NOTE — Miscellaneous (Signed)
Summary: OT clinical evaluation  OT clinical evaluation   Imported By: Jacklynn Ganong 12/28/2009 10:52:52  _____________________________________________________________________  External Attachment:    Type:   Image     Comment:   External Document

## 2010-04-05 NOTE — Medication Information (Signed)
Summary: ccr-lr  Anticoagulant Therapy  Managed by: Vashti Hey, RN Supervising MD: Diona Browner MD, Remi Deter Indication 1: Atrial Fibrillation (ICD-427.31) Lab Used: [facility] [place] Site: [place] INR POC 1.9  Dietary changes: no    Health status changes: no    Bleeding/hemorrhagic complications: no    Recent/future hospitalizations: no    Any changes in medication regimen? no    Recent/future dental: no  Any missed doses?: no       Is patient compliant with meds? yes       Anticoagulation Management History:      The patient is taking warfarin and comes in today for a routine follow up visit.  Negative risk factors for bleeding include an age less than 47 years old.  The bleeding index is 'low risk'.  Negative CHADS2 values include Age > 55 years old.  The start date was 10/07/2002.  Anticoagulation responsible provider: Diona Browner MD, Remi Deter.  INR POC: 1.9.  Cuvette Lot#: [id number].  Exp: 10/11.    Anticoagulation Management Assessment/Plan:      The patient's current anticoagulation dose is Coumadin 5 mg tabs: Take as directed by Coumadin Clinic.  The target INR is 2 - 3.  The next INR is due 05/13/2009.  Anticoagulation instructions were given to patient.  Results were reviewed/authorized by Vashti Hey, RN.  He was notified by Vashti Hey RN.         Prior Anticoagulation Instructions: INR 2.0 Take coumadin 1 1/2 tablets tonight then resume 1 tablet once daily except 1 12/ tablets on Mondays, Wednesdays and Fridays  Current Anticoagulation Instructions: INR 1.9 Take coumadin 1 1/2 tablets tonight then increase dose to 1 1/2 tablet once daily except 1 tablet on Sundays, Tuesdays and Thursdays

## 2010-04-06 ENCOUNTER — Ambulatory Visit: Admit: 2010-04-06 | Payer: Self-pay

## 2010-04-06 ENCOUNTER — Encounter: Payer: Self-pay | Admitting: Cardiology

## 2010-04-06 ENCOUNTER — Encounter (INDEPENDENT_AMBULATORY_CARE_PROVIDER_SITE_OTHER): Payer: 59

## 2010-04-06 DIAGNOSIS — Z7901 Long term (current) use of anticoagulants: Secondary | ICD-10-CM

## 2010-04-06 DIAGNOSIS — I4891 Unspecified atrial fibrillation: Secondary | ICD-10-CM

## 2010-04-07 NOTE — Assessment & Plan Note (Signed)
Summary: 2 MO RECK RT SHOULDER AFTER PT/[facility]HC/BSF   Visit Type:  Follow-up Referring Reannah Totten:  Dr. Sherwood Gambler Primary Pricella Gaugh:  Dr. Artis Delay  CC:  right shoulder pain.  History of Present Illness: I saw Nimai Woodcox in the office today for a 2 month followup visit.  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 He states his shoulder is much better, some movement bothers him. He is doing exercises at home.  Review of systems he does have some crepitance when he 1st starts to raise his arm.  Examination he has full forward elevation. Excellent rotator cuff strength.  He is discharged to home exercise     Allergies: No Known Drug Allergies   Other Orders: Est. Patient Level II (81191)  Patient Instructions: 1)  Please schedule a follow-up appointment as needed.   Orders Added: 1)  Est. Patient Level II [47829]

## 2010-04-07 NOTE — Medication Information (Signed)
Summary: ccr-lr  Anticoagulant Therapy  Managed by: Vashti Hey, RN PCP: Dr. Artis Delay Supervising MD: Dietrich Pates MD, Molly Maduro Indication 1: Atrial Fibrillation (ICD-427.31) Lab Used: [facility] [place] Site: [place] INR POC 2.0  Dietary changes: no    Health status changes: no    Bleeding/hemorrhagic complications: no    Recent/future hospitalizations: no    Any changes in medication regimen? no    Recent/future dental: no  Any missed doses?: no       Is patient compliant with meds? yes       Allergies: No Known Drug Allergies  Anticoagulation Management History:      The patient is taking warfarin and comes in today for a routine follow up visit.  Positive risk factors for bleeding include presence of serious comorbidities.  Negative risk factors for bleeding include an age less than 24 years old.  The bleeding index is 'intermediate risk'.  Positive CHADS2 values include History of HTN and History of Diabetes.  Negative CHADS2 values include Age > 52 years old.  The start date was 10/07/2002.  Anticoagulation responsible provider: Dietrich Pates MD, Molly Maduro.  INR POC: 2.0.  Cuvette Lot#: [id number].  Exp: 10/11.    Anticoagulation Management Assessment/Plan:      The patient's current anticoagulation dose is Coumadin 5 mg tabs: 7.5mg  once daily except 5mg  on Sundays, Tuesdays and Thursdays or as directed by anticoagulation clinic.  The target INR is 2 - 3.  The next INR is due 04/06/2010.  Anticoagulation instructions were given to patient.  Results were reviewed/authorized by Vashti Hey, RN.  He was notified by Vashti Hey RN.         Prior Anticoagulation Instructions: INR 2.7 Continue coumadin 7.5mg  once daily except 5mg  on Sundays, Tuesdays and Thursdays  Current Anticoagulation Instructions: INR 2.0 Take coumadin 2 tablets tonight then resume 1 1/2 once daily except 1 tablet on Sundays, Tuesdays and Thursdays

## 2010-04-07 NOTE — Progress Notes (Signed)
**Note De-Identified Jamani Bearce Obfuscation** Summary: 90 day supply for medication   Phone Note Call from Patient   Caller: Patient Reason for Call: Talk to Nurse Summary of Call: patient would like to know if he could Diltiazem on a 90 day supply / if so, please send to [facility] / tg Initial call taken by: Raechel Ache [facility],  March 11, 2010 8:52 AM  Follow-up for Phone Call        RX faxed to [facility], pt. aware. Follow-up by: Larita Fife Deriona Altemose LPN,  March 11, 2010 1:09 PM

## 2010-04-13 NOTE — Medication Information (Signed)
Summary: ccr-lr  Anticoagulant Therapy  Managed by: Vashti Hey, RN PCP: Dr. Artis Delay Supervising MD: Diona Browner MD, Remi Deter Indication 1: Atrial Fibrillation (ICD-427.31) Lab Used: [facility] [place] Site: [place] INR POC 2.4  Dietary changes: no    Health status changes: no    Bleeding/hemorrhagic complications: no    Recent/future hospitalizations: no    Any changes in medication regimen? no    Recent/future dental: no  Any missed doses?: no       Is patient compliant with meds? yes       Allergies: No Known Drug Allergies  Anticoagulation Management History:      The patient is taking warfarin and comes in today for a routine follow up visit.  Positive risk factors for bleeding include presence of serious comorbidities.  Negative risk factors for bleeding include an age less than 71 years old.  The bleeding index is 'intermediate risk'.  Positive CHADS2 values include History of HTN and History of Diabetes.  Negative CHADS2 values include Age > 8 years old.  The start date was 10/07/2002.  Anticoagulation responsible provider: Diona Browner MD, Remi Deter.  INR POC: 2.4.  Cuvette Lot#: [id number].  Exp: 10/11.    Anticoagulation Management Assessment/Plan:      The patient's current anticoagulation dose is Coumadin 5 mg tabs: 7.5mg  once daily except 5mg  on Sundays, Tuesdays and Thursdays or as directed by anticoagulation clinic.  The target INR is 2 - 3.  The next INR is due 05/04/2010.  Anticoagulation instructions were given to patient.  Results were reviewed/authorized by Vashti Hey, RN.  He was notified by Vashti Hey RN.         Prior Anticoagulation Instructions: INR 2.0 Take coumadin 2 tablets tonight then resume 1 1/2 once daily except 1 tablet on Sundays, Tuesdays and Thursdays  Current Anticoagulation Instructions: INR 2.4 Continue coumadin 7.5mg  once daily except 5mg  on Sundays, Tuesdays and Thursdays

## 2010-05-04 ENCOUNTER — Encounter: Payer: Self-pay | Admitting: Cardiology

## 2010-05-04 ENCOUNTER — Encounter (INDEPENDENT_AMBULATORY_CARE_PROVIDER_SITE_OTHER): Payer: 59

## 2010-05-04 DIAGNOSIS — Z7901 Long term (current) use of anticoagulants: Secondary | ICD-10-CM

## 2010-05-04 DIAGNOSIS — I4891 Unspecified atrial fibrillation: Secondary | ICD-10-CM

## 2010-05-12 NOTE — Medication Information (Signed)
Summary: ccr-lr  Anticoagulant Therapy  Managed by: Vashti Hey, RN PCP: Dr. Artis Delay Supervising MD: Dietrich Pates MD, Molly Maduro Indication 1: Atrial Fibrillation (ICD-427.31) Lab Used: [facility]  Site: [place] INR POC 2.3  Dietary changes: no    Health status changes: no    Bleeding/hemorrhagic complications: no    Recent/future hospitalizations: no    Any changes in medication regimen? no    Recent/future dental: no  Any missed doses?: no       Is patient compliant with meds? yes       Allergies: No Known Drug Allergies  Anticoagulation Management History:      The patient is taking warfarin and comes in today for a routine follow up visit.  Positive risk factors for bleeding include presence of serious comorbidities.  Negative risk factors for bleeding include an age less than 88 years old.  The bleeding index is 'intermediate risk'.  Positive CHADS2 values include History of HTN and History of Diabetes.  Negative CHADS2 values include Age > 69 years old.  The start date was 10/07/2002.  Anticoagulation responsible provider: Dietrich Pates MD, Molly Maduro.  INR POC: 2.3.  Cuvette Lot#: [id number].  Exp: 10/11.    Anticoagulation Management Assessment/Plan:      The patient's current anticoagulation dose is Coumadin 5 mg tabs: 7.5mg  once daily except 5mg  on Sundays, Tuesdays and Thursdays or as directed by anticoagulation clinic.  The target INR is 2 - 3.  The next INR is due 06/01/2010.  Anticoagulation instructions were given to patient.  Results were reviewed/authorized by Vashti Hey, RN.  He was notified by Vashti Hey RN.         Prior Anticoagulation Instructions: INR 2.4 Continue coumadin 7.5mg  once daily except 5mg  on Sundays, Tuesdays and Thursdays  Current Anticoagulation Instructions: INR 2.3 Continue coumadin 7.5mg  once daily except 5mg  on Sundays, Tuesdays and Thursdays

## 2010-06-01 ENCOUNTER — Encounter: Payer: Self-pay | Admitting: Cardiology

## 2010-06-01 DIAGNOSIS — Z7901 Long term (current) use of anticoagulants: Secondary | ICD-10-CM

## 2010-06-01 DIAGNOSIS — I4891 Unspecified atrial fibrillation: Secondary | ICD-10-CM

## 2010-06-08 ENCOUNTER — Ambulatory Visit: Payer: 59 | Admitting: Cardiology

## 2010-06-08 ENCOUNTER — Encounter: Payer: Self-pay | Admitting: Adult Health

## 2010-06-08 ENCOUNTER — Ambulatory Visit (INDEPENDENT_AMBULATORY_CARE_PROVIDER_SITE_OTHER): Payer: 59 | Admitting: *Deleted

## 2010-06-08 ENCOUNTER — Ambulatory Visit (INDEPENDENT_AMBULATORY_CARE_PROVIDER_SITE_OTHER): Payer: 59 | Admitting: Adult Health

## 2010-06-08 DIAGNOSIS — I4891 Unspecified atrial fibrillation: Secondary | ICD-10-CM

## 2010-06-08 DIAGNOSIS — Z7901 Long term (current) use of anticoagulants: Secondary | ICD-10-CM

## 2010-06-08 DIAGNOSIS — I10 Essential (primary) hypertension: Secondary | ICD-10-CM

## 2010-06-08 LAB — POCT INR: INR: 2.9

## 2010-06-08 MED ORDER — POTASSIUM CHLORIDE CRYS ER 10 MEQ PO TBCR: 10.0000 meq | EXTENDED_RELEASE_TABLET | Freq: Every day | ORAL | Status: DC

## 2010-06-08 NOTE — Assessment & Plan Note (Signed)
Well controlled at present. As he is becoming more active, I suspect he will lose weight and BP may begin to decrease.  This will need to be followed to adjust medications as necessary.

## 2010-06-08 NOTE — Patient Instructions (Signed)
**Note De-Identified Riley [name] Obfuscation** Your physician recommends that you schedule a follow-up appointment in: 1 year Your physician has recommended you make the following change in your medication: decrease Potassium to daily and stop taking Lasix

## 2010-06-08 NOTE — Assessment & Plan Note (Signed)
He has had no occurences of this and is doing very well on current medications.  He is asymptomatic.  I will make no changes in medications or schedule any tests at this time. If he becomes symptomatic we will see him sooner, but otherwise see him annually.

## 2010-06-08 NOTE — Progress Notes (Signed)
   Patient ID: Riley [name], male    DOB: 03-Jun-1945, 65 y.o.   MRN: [id number]  HPIMr. Mulligan is a very pleasant man we are following for assessment and treatment of PAF, hypertension, and hyperlipidemia.  He comes today without complaint. In fact, he states he feels better than on last visit one year ago.  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  He states he has become more active and recently has been renovating rooms in houses and chopping wood for family.  He states that other than muscle soreness, he has no complaints of chest pain, palpitations, dizziness or DOE.  He is in very good spirits.    Review of Systems Review of systems complete and found to be negative unless listed above Review of PMH, Meds completed for changes and updates.  Physical Exam General: Well developed, well nourished, in no acute distress Head: Eyes PERRLA, No xanthomas.   Normal cephalic and atramatic  Lungs: Clear bilaterally to auscultation and percussion. Heart: HRRR S1 S2, .  Pulses are 2+ & equal.            No carotid bruit. No JVD.  No abdominal bruits. No femoral bruits. Abdomen: Bowel sounds are positive, abdomen soft and non-tender without masses or                  Hernia's noted. Msk:  Back normal, normal gait. Normal strength and tone for age. Extremities: No clubbing, cyanosis or edema.  DP +1 Neuro: Alert and oriented X 3. Psych:  Good affect, responds appropriately   EKG: Sinus bradycardia with occasional PVC's.  ICRBBB.

## 2010-06-16 ENCOUNTER — Encounter: Payer: Self-pay | Admitting: Adult Health

## 2010-06-20 ENCOUNTER — Other Ambulatory Visit: Payer: Self-pay | Admitting: Cardiology

## 2010-06-22 ENCOUNTER — Encounter: Payer: Self-pay | Admitting: Cardiology

## 2010-06-26 ENCOUNTER — Encounter: Payer: Self-pay | Admitting: *Deleted

## 2010-06-29 ENCOUNTER — Other Ambulatory Visit: Payer: Self-pay | Admitting: Cardiology

## 2010-07-07 ENCOUNTER — Ambulatory Visit (INDEPENDENT_AMBULATORY_CARE_PROVIDER_SITE_OTHER): Payer: 59 | Admitting: *Deleted

## 2010-07-07 DIAGNOSIS — Z7901 Long term (current) use of anticoagulants: Secondary | ICD-10-CM

## 2010-07-07 DIAGNOSIS — I4891 Unspecified atrial fibrillation: Secondary | ICD-10-CM

## 2010-07-07 LAB — POCT INR: INR: 2

## 2010-07-19 NOTE — Letter (Signed)
May 07, 2008    Riley Rear. Riley Gambler, MD  [address]  [place], [place] [address]   RE:  Riley Riley [name], Riley Riley [name]  MRN:  [id number]  /  DOB:  [birth year]   Dear Riley Riley [name]:   Riley Riley [name] returns to the office for continued assessment and treatment of  paroxysmal atrial fibrillation, hypertension, and lower extremity edema.  Since his last visit, his edema has improved dramatically.  The  discomfort in his legs that he experience with that has also resolved.  Blood pressure control has been good.  He had 1 episode of palpitations  that lasted a matter of minutes to perhaps as long as an hour.  He notes  no lightheadedness nor dizzy nor syncope.  He remains physically active.   CURRENT MEDICATIONS:  1. Ranitidine 150 mg b.i.d.  2. Toprol 50 mg daily.  3. Doxazosin 12 mg daily.  4. Glyburide and metformin 2.5/500 mg b.i.d.  5. HCTZ 25 mg daily.  6. Warfarin as directed.  7. Diltiazem 120 mg daily.  8. Furosemide 20 mg daily.  9. Simvastatin 40 mg daily.  10.KCl 20 mEq daily.  11.Lisinopril 40 mg daily.  12.Clonidine TTS level 2 every week.  13.He uses Allegra p.r.n..   PHYSICAL EXAMINATION:  GENERAL:  A very pleasant Riley [name] in no acute  distress.  VITAL SIGNS:  The weight is 241, 6 pounds less than 2 months ago.  Blood  pressure 125/75, heart rate 45 and regular.  The respirations 11 and  unlabored.  NECK:  No jugular venous distention; no carotid bruits.  LUNGS:  Clear.  CARDIAC:  Normal first and second heart sounds; fourth heart sound  present.  ABDOMEN:  Normal bowel sounds; soft and nontender.  EXTREMITIES:  Chronic stasis changes and hyperpigmentation in the skin;  1/2+ edema on the right and trace edema on the left.   IMPRESSION:  Riley Riley [name] is doing well, both with control of paroxysmal  atrial arrhythmias and hypertension.  His edema has dramatically  improved with discontinuation of amlodipine.  Although asymptomatic, his  marked sinus bradycardia is of some concern.  We will further  reduce  Toprol to 25 mg daily.  The Cardiology Nurses will check blood pressure  in 2 months.  I will see Riley Riley [name] again in 1 year.  Due to  chronic anticoagulation, a CBC and stool for Hemoccult testing will be  obtained.    Sincerely,      Riley Friends. Dietrich Pates, MD, [facility]  Electronically Signed    RMR/MedQ  DD: 05/07/2008  DT: 05/08/2008  Job #: [id number]

## 2010-07-19 NOTE — Letter (Signed)
March 10, 2008    Madelin Rear. Sherwood Gambler, MD  [address]  [place], [place] [address]   RE:  Riley [name], Riley [name]  MRN:  [id number]  /  DOB:  12-09-1945   Dear [name]:   Mr. Hu returns to the office for continued assessment and treatment of  hypertension and paroxysmal atrial fibrillation in the setting of type 2  diabetes.  Since his last visit 1 year ago, he has done quite well.  He  has not required urgent medical care.  He has developed no new medical  problems.  He has been taking his usual medications, generally without  difficulty.  He has some issues with urinary frequency due to his  diuretic regime.   CURRENT MEDICATIONS:  Include  1. Ranitidine 150 mg b.i.d.  2. HCTZ 25 mg daily.  3. Warfarin as directed with stable therapeutic anticoagulation.  4. Lotrel 10/20 mg daily.  5. Glyburide and metformin 2.5/500 mg b.i.d.  6. Simvastatin 40 mg daily.  7. Diltiazem 120 mg daily.  8. KCl 20 mEq daily.  9. Furosemide 20 mg b.i.d.  10.Toprol 50 mg daily.  11.Doxazosin 12 mg daily   PHYSICAL EXAMINATION:  GENERAL:  A very pleasant gentleman, in no acute  distress.  VITAL SIGNS:  The weight is 247, 5 pounds more than the last year.  Blood pressure 120/85, heart rate 70 and regular, and respirations 14.  NECK:  No jugular venous distention; normal carotid upstrokes without  bruits.  LUNGS:  Clear.  CARDIAC:  Normal first and second heart sounds; fourth heart sound  present.  ABDOMEN:  Soft and nontender; no organomegaly.  EXTREMITIES:  Chronic stasis changes; 1-2+ edema on the right and 2-3+  on the left to the knee.   LABORATORY DATA:  INR today is 2.7.  No laboratory results, otherwise  available since January of 2010.   IMPRESSION:  Mr. Seiden is doing well overall.  He has had no clinically  apparent atrial fibrillation for 2 years.  He does note occasional  palpitations that last up to 2 hours.  Blood pressure control is  excellent.  Unfortunately, he has substantial chronic  edema, the  etiology of this is unclear, but amlodipine can certainly be a  contributor.  We will stop Lotrel and start lisinopril 40 mg daily plus  clonidine TTS level 2.  He will monitor blood pressure, return in 1  month for a formal blood pressure check with cardiology nurses.  I will  see him again in 2 months.  He will need stool for hemoccult testing and  a CBC at that time.  A chemistry profile is pending.    Sincerely,      Gerrit Friends. Dietrich Pates, MD, [facility]  Electronically Signed    RMR/MedQ  DD: 03/10/2008  DT: 03/11/2008  Job #: [id number]

## 2010-07-19 NOTE — Letter (Signed)
March 11, 2007    Patrica Duel, M.D.  [address], [address]  [place],  [place] [address]   RE:  MICAL, KICKLIGHTER  MRN:  [id number]  /  DOB:  1946/02/28   Dear Loraine Leriche:   Mr. Dever returns to the office for continued assessment and treatment of  paroxysmal atrial fibrillation.  He believes that he has short episodes  during the day, but his arrhythmia has not been documented since October  2007.  Hypertension has been under good control as has diabetes.  He has  some chronic back discomfort, but his general sense of well-being and  energy level has been improved over the past year.  He is followed in  our anticoagulation clinic with generally therapeutic INRs.   CURRENT MEDICATIONS:  1. Ranitidine 115 mg b.i.d.  2. Hydrochlorothiazide 25 mg daily.  3. Warfarin as directed.  4. Lotrel 10/20 mg daily.  5. Simvastatin 40 mg daily.  6. Glyburide/metformin 2.5/500 mg b.i.d.  7. Diltiazem 120 mg daily.  8. KCl 20 mEq daily.  9. Furosemide 40 mg daily.  10.Toprol 50 mg daily.  11.Doxazosin 4 mg t.i.d.   PHYSICAL EXAMINATION:  On exam, pleasant gentleman in no acute distress.  Weight is 242, 10 pounds more than last year, but unchanged from 2007.  Blood pressure 125/80, heart rate 60 and regular, respirations 18.  NECK:  No jugular venous distention; normal carotid upstrokes without  bruits.  LUNGS:  Clear.  CARDIAC:  Normal first and second heart sounds; grade 1-2/6 systolic  ejection murmur at the cardiac base.  ABDOMEN: Soft and nontender; no  masses; no organomegaly.  EXTREMITIES: Chronic stasis changes; 1+ pitting edema.   Rhythm strip:  Sinus bradycardia at a rate of 59; normal intervals.   IMPRESSION:  Mr. Mccalla is doing well overall.  Although heart rate is on  the slow side, he has no symptoms referable to this and likely requires  his current medication to control heart rate and atrial fibrillation  should it recur.  Accordingly, I have made no changes in his  medications.  Hypertension is under good control.  I will leave  management of diabetes to your discretion.  A CBC and stool for  hemoccult testing will be obtained.  A lipid profile was excellent last  year.  A chemistry profile is also pending.  If lab results are good, I  will plan to see this nice gentleman again in 1 year.    Sincerely,      Gerrit Friends. Dietrich Pates, MD, [facility]  Electronically Signed    RMR/MedQ  DD: 03/11/2007  DT: 03/11/2007  Job #: [id number]

## 2010-07-19 NOTE — Letter (Signed)
August 17, 2006    Riley [name] Riley [name] [name], M.D.  [address]  [place], [place] [address]   RE:  Riley [name], Riley [name] [name]  MRN:  [id number]  /  DOB:  07/20/1945   Dear Riley [name] Riley [name] [name]:   Riley [name] Riley [name] [name] returns to the office for continued assessment and treatment of  paroxysmal atrial fibrillation.  Since his last visit 8 months ago, he  notes that he feels better.  He attributes Riley [name] to decreased stress  related to the death of his parents, who were chronically ill and  suffering.  He continues to note some episodes of palpitations that were  quite tolerable.  These occur irregularly and last for minutes to hours.  He has been started on furosemide for increased pedal edema.  Otherwise,  he has had no acute medical issues.   CURRENT MEDICATIONS:  1. Doxazosin 12 mg daily.  2. Ranitidine 150 mg b.i.d.  3. Hydrochlorothiazide 25 mg daily.  4. Warfarin as directed with stable and therapeutic anticoagulation.  5. Lotrel 10/20 mg daily.  6. Glyburide/metformin 2.5/500 mg b.i.d.  7. Simvastatin 40 mg daily.  8. Diltiazem 120 mg daily.  9. KCl 20 mEq daily.  10.Toprol 100 mg daily.  11.Furosemide 40 mg daily.   A recent lipid profile was excellent with total cholesterol of 113,  triglycerides of 92, HDL 47 and LDL of 48.  A CBC obtained with his last  visit was normal.  He reports returning stool cards and being told that  they were negative.   PHYSICAL EXAMINATION:  A very pleasant, tanned Riley [name] [name] in no acute  distress.  The weight is 232, 6 pounds less than 8 months ago.  Blood pressure  120/70, heart rate 50 and regular.  Respirations 16.  NECK:  No jugular venous distension.  No carotid bruits.  LUNGS:  Clear.  CARDIAC:  Normal 1st and 2nd heart sounds, 4th heart sound present.  Minimal basilar systolic murmur.  ABDOMEN:  Soft and nontender, no organomegaly.  EXTREMITIES:  Chronic stasis changes, 1-2+ edema.   Rhythm strip:  Sinus bradycardia at a rate of 49.   IMPRESSION:  Riley [name] Riley [name] [name] is doing  generally well.  Due to sinus  bradycardia, his dose of Toprol will be reduced to 50 mg daily.  We will  continue to monitor blood pressure and heart rate in anticoagulation  clinic.  Due to the use of 2 diuretics, a chemistry profile will be  obtained.  I will plan to see Riley [name] Riley [name] [name] again in 6 months.    Sincerely,      Gerrit Friends. Dietrich Pates, MD, [facility]  Electronically Signed    RMR/MedQ  DD: 08/17/2006  DT: 08/17/2006  Job #: [id number]

## 2010-07-22 NOTE — Procedures (Signed)
   NAME:  MATHHEW, BUYSSE                             ACCOUNT NO.:  [id number]   MEDICAL RECORD NO.:  [id number]                   PATIENT TYPE:  INP   LOCATION:  IC06                                 FACILITY:  [facility]   PHYSICIAN:  Thomas C. Wall, M.D.                DATE OF BIRTH:  12-27-1945   DATE OF PROCEDURE:  DATE OF DISCHARGE:                                  ECHOCARDIOGRAM   INDICATIONS:  Echo was done for 427.31--atrial fibrillation.   The echocardiogram is technically adequate.   CONCLUSIONS:  1. Severe left atrial enlargement.  2. Normal left ventricular chamber size and overall function.  3. Mild left ventricular hypertrophy with normal left ventricular systolic     function.  4. Mild mitral regurgitation.  5. Normal right-sided structures and function.  6. No pericardial effusion.                                               Thomas C. Wall, M.D.    TCW/MEDQ  D:  09/24/2002  T:  09/24/2002  Job:  [id number]   cc:   Kirk Ruths, M.D.  [address]  [place]  [place] [address]  Fax: [phone]   Vida Roller, M.D.  Fax: [phone]

## 2010-07-22 NOTE — Procedures (Signed)
NAMEGERELL, FORTSON                   ACCOUNT NO.:  [id number]   MEDICAL RECORD NO.:  [id number]          PATIENT TYPE:  EMS   LOCATION:  ED                            FACILITY:  [facility]   PHYSICIAN:  Edward L. Juanetta Gosling, M.D.DATE OF BIRTH:  10-12-1945   DATE OF PROCEDURE:  06/03/2005  DATE OF DISCHARGE:  06/03/2005                                EKG INTERPRETATION   The computer has read the rhythm as sinus tachycardia. I wonder it is  actually more of a supraventricular tachycardia such as atrial flutter with  block.  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  There is suggestion of right  ventricular hypertrophy.  Other findings per the computer probably would  best be reevaluated after his rate is slower.   Abnormal electrocardiogram.      Oneal Deputy. Juanetta Gosling, M.D.  Electronically Signed     ELH/MEDQ  D:  06/05/2005  T:  06/05/2005  Job:  [id number]

## 2010-07-22 NOTE — Discharge Summary (Signed)
   [name], Riley [name]                             ACCOUNT NO.:  [id number]   MEDICAL RECORD NO.:  [id number]                   PATIENT TYPE:  INP   LOCATION:  A211                                 FACILITY:  [facility]   PHYSICIAN:  Kirk Ruths, M.D.            DATE OF BIRTH:  1945/12/27   DATE OF ADMISSION:  09/23/2002  DATE OF DISCHARGE:  09/26/2002                                 DISCHARGE SUMMARY   DISCHARGE DIAGNOSES:  1. Atrial fibrillation with rapid response, new onset with spontaneous     conversion to regular sinus rhythm.  2. History of hypertension.  3. History of type 2 diabetes.  4. Chronic back pain.  5. Left atrial enlargement.  6. Sinusitis.   HOSPITAL COURSE:  This 65 year old white male had longstanding history of  palpitations.  He had been seen and worked up by cardiology on several  occasions, had significant findings.  On the day of admission the patient  had run up a hill at home with subsequent palpitations.  He was seen in my  office and noted to be in atrial fibrillation with rapid ventricular  response.  The patient was admitted to ICU with Cardizem per IV was  intended.   By the time the patient arrived in the ICU he was in a regular sinus rhythm  with the rate in the 50s.  He was seen in consultation by Dr. Dorethea Clan of  [facility].  The patient was placed on Lovenox as well as converted  to Coumadin during his stay.  Echocardiogram showed severe left atrial  enlargement.  Otherwise normal left ventricular size and function.   Chest x-ray showed a small nodule in the right upper lobe.  Subsequent CT  showed that it was a granuloma.  The patient has been in regular sinus  rhythm with a mild bradycardia since his admission. His ProTime after 3 days  of Coumadin at 5 mg is 14.2.  He had negative cardiac enzymes throughout his  stay.  CBC was normal.  Thyroid studies were normal with TSH being 1.8.   DISCHARGE MEDICATIONS:  The patient will be  discharged home on Coumadin as  well as his previous medications although the labetalol will be stopped and  instead Toprol 50 mg will be added.  Also given Allegra 180 mg for  sinusitis.   The patient will be followed in the office on the 26th at which time we will  check his INR as well as his heart rate.                                               Kirk Ruths, M.D.    WMM/MEDQ  D:  09/26/2002  T:  09/26/2002  Job:  [id number]

## 2010-07-22 NOTE — H&P (Signed)
   NAME:  Riley [name], Riley [name]                          ACCOUNT NO.:  [id number]   MEDICAL RECORD NO.:  [id number]                  PATIENT TYPE:   LOCATION:                                       FACILITY:   PHYSICIAN:  Kirk Ruths, M.D.            DATE OF BIRTH:   DATE OF ADMISSION:  DATE OF DISCHARGE:                                HISTORY & PHYSICAL   CHIEF COMPLAINT:  Heart racing.   HISTORY OF PRESENT ILLNESS:  This is a 65 year old white male with a past  history of hypertension, as well as a longstanding history of irregular  heartbeats but has never been found to have atrial fibrillation.  The  patient was admitted for atrial fibrillation, rapid response, new onset.   ALLERGIES:  He is allergic to no medications.   MEDICATIONS:  1. Labetalol 200 mg q.i.d.  2. Pepcid 20 mg daily.  3. Cardura 12 mg daily.  4. Chlorthiazide 10 mg daily.  5. Glucovance 2.5/500 daily.  6. Altace 10 mg daily.   PAST MEDICAL HISTORY:  1. History of hypertension.  2. Type 2 diabetes.  3. Chronic back pain.   REVIEW OF SYSTEMS:  Denies nausea, vomiting, chest pain, diaphoresis.   SOCIAL HISTORY:  The patient does not smoke or drink alcohol.  He works for  [employer].   PHYSICAL EXAMINATION:  GENERAL:  A well-developed white male in no acute  distress.  VITAL SIGNS:  He is afebrile.  Blood pressure is 150/86, pulse is 130 and  irregularly irregular.  The patient's respirations are 18.  HEENT:  TM's normal.  Pupils equal to light and accommodation.  Oropharynx  benign.  NECK:  Supple without JVD, bruits, or thyromegaly.  LUNGS:  Clear in all areas.  HEART:  Irregular sinus rhythm with the above-mentioned tachycardia.  ABDOMEN:  Soft and nontender.  EXTREMITIES:  Without clubbing, cyanosis, or edema.  NEUROLOGIC:  Grossly intact.   ASSESSMENT:  1. Atrial fibrillation with rapid response, new onset.  2. A long history of palpitations.  3. History of significant hypertension.  4.  History of type 2 diabetes.                                               Kirk Ruths, M.D.    WMM/MEDQ  D:  09/23/2002  T:  09/23/2002  Job:  [id number]

## 2010-07-22 NOTE — Procedures (Signed)
Riley [name], SERANO                   ACCOUNT NO.:  [id number]   MEDICAL RECORD NO.:  [id number]          PATIENT TYPE:  EMS   LOCATION:  ED                            FACILITY:  [facility]   PHYSICIAN:  Edward L. Juanetta Gosling, M.D.DATE OF BIRTH:  12-18-1945   DATE OF PROCEDURE:  01/02/2006  DATE OF DISCHARGE:  01/02/2006                                EKG INTERPRETATION   Time is 1639, January 02, 2006.   The rhythm is atrial fibrillation with a ventricular response in the 80s.  There is left axis deviation.  There is a right bundle branch block.  Q  waves are seen inferiorly, which could indicate previous infarction or may  be related to the bundle branch block, and clinical correlation is  suggested.   Abnormal electrocardiogram.      Oneal Deputy. Juanetta Gosling, M.D.  Electronically Signed     ELH/MEDQ  D:  01/03/2006  T:  01/03/2006  Job:  [id number]

## 2010-07-22 NOTE — Consult Note (Signed)
NAME:  Riley [name], Riley [name]                             ACCOUNT NO.:  [id number]   MEDICAL RECORD NO.:  [id number]                   PATIENT TYPE:  INP   LOCATION:  IC06                                 FACILITY:  [facility]   PHYSICIAN:  Vida Roller, M.D.                DATE OF BIRTH:  [birth year]   DATE OF CONSULTATION:  09/23/2002  DATE OF DISCHARGE:                                   CONSULTATION   CARDIOLOGY CONSULTATION   PRIMARY CARDIOLOGIST:  Sells Bing, M.D.   REASON FOR CONSULTATION:  New onset atrial fibrillation.   HISTORY OF PRESENT ILLNESS:  Mr. Proch is a 65 year old man with a history of  diabetes, hypertension, and a long history of palpitations of unknown  etiology who presented to see his primary care physician complaining of  fluttering and increased heart rate, states that he was late for his  appointment and ran in from the car along a long driveway and felt that he  was having particularly significant increase in heart rate with an irregular  rhythm, this lasted for about an hour, the heart rate would not come down  and he was referred for admission to the intensive care unit directly.  Upon  presentation to the intensive care unit, he spontaneously converted into  sinus rhythm and has felt well since then; no chest pain, no shortness of  breath, he has no PND or orthopnea, no congestive heart failure symptoms at  all.   PAST MEDICAL HISTORY:  His past medical history is significant for diabetes  which is noninsulin dependent, hypertension which has been difficult to  control in the past, there is a history of disequilibrium thought to be  secondary to a repetitive injury, he also has a history of nonobstructive  coronary disease and a catheterization done in 1998 with normal left  ventricular function.   MEDICATIONS:  His medications prior to admission were multivitamin once a  day, Glucovance 2.5 mg twice a day, Cardura 12 mg a day, Zantac 150 mg a  day,  hydrochlorothiazide 25 mg a day, Altace 10 mg a day, and labetalol 200  mg q.6h.   In the hospital he is on all of those medications except instead of being on  Glucovance he is on Glyburide 2.5 mg a day and metformin 500 mg a day.   SOCIAL HISTORY:  He lives in [place] with his wife and son.  He works for  ______ in the third shift.  He is married.  He has two children.  Does not  smoke cigarettes.  Occasionally drinks wine.  He is reasonably active.   FAMILY HISTORY:  His mother is still alive at age 31 with no coronary  disease.  Father is still alive at age 20 with only hypertension.  There is  a history of atrial fibrillation in his family.   REVIEW OF SYSTEMS:  Noncontributory.  PHYSICAL EXAMINATION:  GENERAL:  He is a well-developed, well-nourished  white male weighing 231 pounds.  His temperature is normal, his pulse is 56  and in sinus, respiratory rate 16, his blood pressure is 132/79, he is  saturating 97% on room air.  HEAD AND NECK:  Examination of the head, ears, eyes, nose and throat is  unremarkable.  The neck is supple.  There is no jugular venous distention or  carotid bruits.  CHEST:  Clear to auscultation.  HEART:  Nondisplaced point of maximal impulse with no lift or thrills.  First and second heart sounds are normal.  There is no third or fourth heart  sound and no murmurs are noted.  ABDOMEN:  Soft, nontender, normoactive bowel sounds.  EXTREMITIES:  Lower extremities are without significant clubbing, cyanosis,  or edema, 2+ pulses throughout without bruits.  GU AND RECTAL:  Exams are deferred.  NEUROLOGIC:  Normal.   DIAGNOSTIC STUDIES:  Chest x-ray is pending.  His electrocardiogram reveals  sinus rhythm at a rate of 52 with mild left axis deviation, his PR interval  is normal, his QRS duration is mildly prolonged at 108 Msec, he has no  evidence of left ventricular hypertrophy.  He has a single set of cardiac  enzymes which are inconsistent with  acute myocardial infarction.  All of his  other laboratories are pending.   IMPRESSION AND PLAN:  So, essentially I have gentleman with paroxysmal  atrial fibrillation which appears to be sort of consistently appearing  throughout the last couple of years most recently with this exertional  component.  I think it is probably reasonable at this point to look at his  left ventricle to see if it is normal.  We will do an echo to assess overall  cardiac health.  I think he probably needs to have his blood pressure  medications adjusted slightly so that we get a bit more rate control and I  will add a low dose of Toprol and down regulate his labetalol as such.  At  this point the question is whether or not he needs to be anticoagulated with  Coumadin.  I think given his age and his abnormal EKG the likelihood is very  high that he is not going to have a structurally normal heart and therefore  probably reasonable to start Coumadin now, he is already on Lovenox and we  will move forward from there.  Whether or not he needs an ischemic workup is  also an interesting question as he is describing an episode of the onset of  atrial fibrillation with exertion.  He has had an assessment for coronary  artery disease a number of years ago which was normal, really does not have  any exertional component to his chest pain and he regularly exercises so I  do not think it is really necessary at this point but we will see what his  left ventricle shows on echocardiogram.                                               Vida Roller, M.D.    JH/MEDQ  D:  09/23/2002  T:  09/23/2002  Job:  [id number]

## 2010-07-22 NOTE — Letter (Signed)
January 03, 2006    Kirk Ruths, M.D.  [address]  [place]  [place] [address]   RE:  LEMARIO, CHAIKIN  MRN:  [id number]  /  DOB:  [birth year]   Dear Annette Stable:   It was my pleasure evaluating Mr. Narayanan today in the office today at your  request. As you know, he was previously cared for by Dr. Dorethea Clan for  paroxysmal atrial-fibrillation, hypertension and other cardiovascular risk  factors. Coronary angiography was carried out in 1998 and was negative. Mr.  Files developed palpitations yesterday prompting emergency department  evaluation. He was subsequently seen by you. In both instances, the rhythm  was atrial-fibrillation with a controlled ventricular response. There has  been no chest discomfort, lightheadedness, syncope nor dyspnea. The patient  primarily sought evaluation because he was hospitalized the last time he  noted A-fib.   Current medications include doxazosin 12 mg q.d., ranitidine 150 mg b.i.d.,  HCTZ 25 mg q.d., metoprolol 100 mg q.d., Lotrel 10/20 mg q.d.,  glyburide/metformin 2.5/500 b.i.d., simvastatin 40 mg q.d., diltiazem 120 mg  q.d., KCL 20 mEq q.d.   On examination, pleasant, tanned gentleman in no acute distress. The weight  is 238, 5 pounds less than in May. Blood pressure was 125/75, heart rate 70  and regular, respirations 16.  NECK: No jugulovenous distention; normal carotid upstrokes without bruits.  LUNGS:  Clear.  CARDIAC: Normal first and second heart sounds.  ABDOMEN: Soft and nontender; no organomegaly.  EXTREMITIES: Chronic stasis changes; 1+ edema.   IMPRESSION:  Mr. Spang is doing generally well. By examination, he has  converted spontaneously to sinus rhythm. His blood pressure control is  excellent. Lipids when last assessed were also excellent. I will leave  management of diabetes to your discretion, obtain a CBC and stool for  Hemoccult testing in light of his chronic anticoagulation and continue to  monitor warfarin therapy in our clinic. A  return office visit is scheduled  six months hence. He is advised to receive influenza vaccine at work.    Sincerely,      Gerrit Friends. Dietrich Pates, MD, [facility]  Electronically Signed    RMR/MedQ  DD: 01/03/2006  DT: 01/03/2006  Job #: [id number]

## 2010-08-03 ENCOUNTER — Other Ambulatory Visit: Payer: Self-pay | Admitting: Cardiology

## 2010-08-04 ENCOUNTER — Ambulatory Visit (INDEPENDENT_AMBULATORY_CARE_PROVIDER_SITE_OTHER): Payer: 59 | Admitting: *Deleted

## 2010-08-04 DIAGNOSIS — Z7901 Long term (current) use of anticoagulants: Secondary | ICD-10-CM

## 2010-08-04 DIAGNOSIS — I4891 Unspecified atrial fibrillation: Secondary | ICD-10-CM

## 2010-08-04 LAB — POCT INR: INR: 2.2

## 2010-08-10 ENCOUNTER — Other Ambulatory Visit: Payer: Self-pay | Admitting: Cardiology

## 2010-09-01 ENCOUNTER — Ambulatory Visit (INDEPENDENT_AMBULATORY_CARE_PROVIDER_SITE_OTHER): Payer: 59 | Admitting: *Deleted

## 2010-09-01 DIAGNOSIS — I4891 Unspecified atrial fibrillation: Secondary | ICD-10-CM

## 2010-09-01 DIAGNOSIS — Z7901 Long term (current) use of anticoagulants: Secondary | ICD-10-CM

## 2010-09-01 LAB — POCT INR: INR: 3

## 2010-09-29 ENCOUNTER — Ambulatory Visit (INDEPENDENT_AMBULATORY_CARE_PROVIDER_SITE_OTHER): Payer: 59 | Admitting: *Deleted

## 2010-09-29 DIAGNOSIS — I4891 Unspecified atrial fibrillation: Secondary | ICD-10-CM

## 2010-09-29 DIAGNOSIS — Z7901 Long term (current) use of anticoagulants: Secondary | ICD-10-CM

## 2010-09-29 LAB — POCT INR: INR: 2.2

## 2010-10-27 ENCOUNTER — Ambulatory Visit (INDEPENDENT_AMBULATORY_CARE_PROVIDER_SITE_OTHER): Payer: 59 | Admitting: *Deleted

## 2010-10-27 DIAGNOSIS — I4891 Unspecified atrial fibrillation: Secondary | ICD-10-CM

## 2010-10-27 DIAGNOSIS — Z7901 Long term (current) use of anticoagulants: Secondary | ICD-10-CM

## 2010-10-27 LAB — POCT INR: INR: 2.6

## 2010-11-24 ENCOUNTER — Ambulatory Visit (INDEPENDENT_AMBULATORY_CARE_PROVIDER_SITE_OTHER): Payer: 59 | Admitting: *Deleted

## 2010-11-24 DIAGNOSIS — I4891 Unspecified atrial fibrillation: Secondary | ICD-10-CM

## 2010-11-24 DIAGNOSIS — Z7901 Long term (current) use of anticoagulants: Secondary | ICD-10-CM

## 2010-12-22 ENCOUNTER — Ambulatory Visit (INDEPENDENT_AMBULATORY_CARE_PROVIDER_SITE_OTHER): Payer: 59 | Admitting: *Deleted

## 2010-12-22 DIAGNOSIS — I4891 Unspecified atrial fibrillation: Secondary | ICD-10-CM

## 2010-12-22 DIAGNOSIS — Z7901 Long term (current) use of anticoagulants: Secondary | ICD-10-CM

## 2010-12-22 LAB — POCT INR: INR: 3

## 2011-01-19 ENCOUNTER — Ambulatory Visit (INDEPENDENT_AMBULATORY_CARE_PROVIDER_SITE_OTHER): Payer: 59 | Admitting: *Deleted

## 2011-01-19 DIAGNOSIS — I4891 Unspecified atrial fibrillation: Secondary | ICD-10-CM

## 2011-01-19 DIAGNOSIS — Z7901 Long term (current) use of anticoagulants: Secondary | ICD-10-CM

## 2011-01-24 ENCOUNTER — Other Ambulatory Visit: Payer: Self-pay | Admitting: Adult Health

## 2011-02-16 ENCOUNTER — Ambulatory Visit (INDEPENDENT_AMBULATORY_CARE_PROVIDER_SITE_OTHER): Payer: 59 | Admitting: *Deleted

## 2011-02-16 DIAGNOSIS — Z7901 Long term (current) use of anticoagulants: Secondary | ICD-10-CM

## 2011-02-16 DIAGNOSIS — I4891 Unspecified atrial fibrillation: Secondary | ICD-10-CM

## 2011-03-21 ENCOUNTER — Other Ambulatory Visit: Payer: Self-pay | Admitting: Cardiology

## 2011-03-30 ENCOUNTER — Ambulatory Visit (INDEPENDENT_AMBULATORY_CARE_PROVIDER_SITE_OTHER): Payer: 59 | Admitting: *Deleted

## 2011-03-30 DIAGNOSIS — Z7901 Long term (current) use of anticoagulants: Secondary | ICD-10-CM

## 2011-03-30 DIAGNOSIS — I4891 Unspecified atrial fibrillation: Secondary | ICD-10-CM

## 2011-05-10 ENCOUNTER — Other Ambulatory Visit: Payer: Self-pay | Admitting: Cardiology

## 2011-05-11 ENCOUNTER — Ambulatory Visit (INDEPENDENT_AMBULATORY_CARE_PROVIDER_SITE_OTHER): Payer: 59 | Admitting: *Deleted

## 2011-05-11 DIAGNOSIS — I4891 Unspecified atrial fibrillation: Secondary | ICD-10-CM

## 2011-05-11 DIAGNOSIS — Z7901 Long term (current) use of anticoagulants: Secondary | ICD-10-CM

## 2011-05-11 LAB — POCT INR: INR: 2.3

## 2011-06-22 ENCOUNTER — Ambulatory Visit (INDEPENDENT_AMBULATORY_CARE_PROVIDER_SITE_OTHER): Payer: 59 | Admitting: *Deleted

## 2011-06-22 ENCOUNTER — Other Ambulatory Visit: Payer: Self-pay | Admitting: Adult Health

## 2011-06-22 ENCOUNTER — Encounter: Payer: Self-pay | Admitting: Cardiology

## 2011-06-22 ENCOUNTER — Ambulatory Visit (INDEPENDENT_AMBULATORY_CARE_PROVIDER_SITE_OTHER): Payer: 59 | Admitting: Cardiology

## 2011-06-22 VITALS — BP 143/75 | HR 62 | Ht 73.0 in | Wt 225.0 lb

## 2011-06-22 DIAGNOSIS — E785 Hyperlipidemia, unspecified: Secondary | ICD-10-CM | POA: Insufficient documentation

## 2011-06-22 DIAGNOSIS — Z7901 Long term (current) use of anticoagulants: Secondary | ICD-10-CM

## 2011-06-22 DIAGNOSIS — I4891 Unspecified atrial fibrillation: Secondary | ICD-10-CM

## 2011-06-22 DIAGNOSIS — I10 Essential (primary) hypertension: Secondary | ICD-10-CM

## 2011-06-22 DIAGNOSIS — R609 Edema, unspecified: Secondary | ICD-10-CM

## 2011-06-22 DIAGNOSIS — E119 Type 2 diabetes mellitus without complications: Secondary | ICD-10-CM | POA: Insufficient documentation

## 2011-06-22 DIAGNOSIS — I480 Paroxysmal atrial fibrillation: Secondary | ICD-10-CM

## 2011-06-22 LAB — POCT INR: INR: 2.6

## 2011-06-22 MED ORDER — PRAVASTATIN SODIUM 40 MG PO TABS: 40.0000 mg | ORAL_TABLET | Freq: Every day | ORAL | Status: DC

## 2011-06-22 NOTE — Progress Notes (Signed)
Name: Riley [name]    DOB: Nov 01, 1945  Age: 66 y.o.  MR#: [id number]       PCP:  Cassell Smiles., MD, MD      Insurance: @PAYORNAME @   CC:    Chief Complaint  Patient presents with  . Appointment    PAF    VS BP 143/75  Pulse 62  Ht 6\' 1"  (1.854 m)  Wt 225 lb (102.059 kg)  BMI 29.69 kg/m2  Weights Current Weight  06/22/11 225 lb (102.059 kg)  06/08/10 230 lb (104.327 kg)  12/13/09 234 lb (106.142 kg)    Blood Pressure  BP Readings from Last 3 Encounters:  06/22/11 143/75  06/08/10 120/65  06/14/09 136/77     Admit date:  (Not on file) Last encounter with RMR:  05/10/2011   Allergy No Known Allergies  Current Outpatient Prescriptions  Medication Sig Dispense Refill  . COUMADIN 5 MG tablet TAKE AS DIRECTED BY COUMADIN CLINIC.  60 each  3  . diltiazem (CARDIZEM CD) 180 MG 24 hr capsule TAKE ONE CAPSULE DAILY.  30 capsule  10  . doxazosin (CARDURA) 4 MG tablet 3 tabs DAILY      . glyBURIDE-metformin (GLUCOVANCE) 2.5-500 MG per tablet Take 1 tablet by mouth 2 (two) times daily.        . hydrochlorothiazide (HYDRODIURIL) 25 MG tablet Take 25 mg by mouth daily.      Marland Kitchen lisinopril (PRINIVIL,ZESTRIL) 40 MG tablet Take 40 mg by mouth daily.      . potassium chloride SA (K-DUR,KLOR-CON) 10 MEQ tablet Take 1 tablet (10 mEq total) by mouth daily.  30 tablet  11  . ranitidine (ZANTAC) 150 MG tablet Take 150 mg by mouth 2 (two) times daily.        . simvastatin (ZOCOR) 40 MG tablet Take 40 mg by mouth at bedtime.        . TEKTURNA 150 MG tablet TAKE ONE TABLET DAILY.  30 each  6    Discontinued Meds:    Medications Discontinued During This Encounter  Medication Reason  . PRINIVIL 40 MG tablet Error  . HYDRODIURIL 25 MG tablet Error    Patient Active Problem List  Diagnoses  . Edema  . Paroxysmal atrial fibrillation  . Chronic anticoagulation  . Hypertension  . Diabetes mellitus, type 2  . Hyperlipidemia    LABS Anti-coag visit on 06/22/2011  Component Date Value    . INR 06/22/2011 2.6   Anti-coag visit on 05/11/2011  Component Date Value  . INR 05/11/2011 2.3   Anti-coag visit on 03/30/2011  Component Date Value  . INR 03/30/2011 3.0      Results for this Opt Visit:     Results for orders placed in visit on 05/11/11  POCT INR      Component Value Range   INR 2.3      EKG Orders placed in visit on 06/16/10  . EKG     Prior Assessment and Plan Problem List as of 06/22/2011          Cardiology Problems   Paroxysmal atrial fibrillation   Hypertension   Hyperlipidemia     Other   Edema   Chronic anticoagulation   Diabetes mellitus, type 2       Imaging: No results found.   FRS Calculation: Score not calculated. Missing: Total Cholesterol

## 2011-06-22 NOTE — Patient Instructions (Signed)
Your physician recommends that you schedule a follow-up appointment in: 1 YEAR  Your physician has recommended you make the following change in your medication:  1 - STOP SIMVASTATIN 2 - START PRAVASTATIN 40 MG DAILY  STOOL CARDS X 3 AND RETURN TO OFFICE ASAP

## 2011-06-22 NOTE — Progress Notes (Signed)
Patient ID: Riley [name], male   DOB: 11/10/1945, 66 y.o.   MRN: [id number]  HPI: Scheduled return visit for this delightful gentleman with long-standing paroxysmal atrial fibrillation treated with anticoagulation and control of heart rate.  He reports occasional palpitations, particularly after sexual activity, but nothing that he finds troublesome.  He remains quite active with no exertional symptoms.  He has some problems with sinus congestion and sinusitis, but has had no major medical issues in recent years.  He has never undergone colonoscopy, believing that it is best not to perform tests looking for serious illnesses.  Prior to Admission medications   Medication Sig Start Date End Date Taking? Authorizing Provider  COUMADIN 5 MG tablet TAKE AS DIRECTED BY COUMADIN CLINIC. 03/21/11  Yes Kathlen Brunswick, MD  diltiazem (CARDIZEM CD) 180 MG 24 hr capsule TAKE ONE CAPSULE DAILY. 08/03/10  Yes Kathlen Brunswick, MD  doxazosin (CARDURA) 4 MG tablet 3 tabs DAILY   Yes Historical Provider, MD  glyBURIDE-metformin (GLUCOVANCE) 2.5-500 MG per tablet Take 1 tablet by mouth 2 (two) times daily.     Yes Historical Provider, MD  hydrochlorothiazide (HYDRODIURIL) 25 MG tablet Take 25 mg by mouth daily.   Yes Historical Provider, MD  lisinopril (PRINIVIL,ZESTRIL) 40 MG tablet Take 40 mg by mouth daily.   Yes Historical Provider, MD  potassium chloride SA (K-DUR,KLOR-CON) 10 MEQ tablet Take 1 tablet (10 mEq total) by mouth daily. 06/08/10  Yes Jodelle Gross, NP  ranitidine (ZANTAC) 150 MG tablet Take 150 mg by mouth 2 (two) times daily.     Yes Historical Provider, MD  simvastatin (ZOCOR) 40 MG tablet Take 40 mg by mouth at bedtime.     Yes Historical Provider, MD  TEKTURNA 150 MG tablet TAKE ONE TABLET DAILY. 01/24/11  Yes Jodelle Gross, NP   No Known Allergies    Past medical history, social history, and family history reviewed and updated.  ROS: Denies chest discomfort, dyspnea, orthopnea, PND,  pedal edema, lightheadedness or syncope.  All other systems reviewed and are negative.  PHYSICAL EXAM: BP 143/75  Pulse 62  Ht 6\' 1"  (1.854 m)  Wt 102.059 kg (225 lb)  BMI 29.69 kg/m2  General-Well developed; no acute distress Body habitus-Overweight Neck-No JVD; no carotid bruits Lungs-clear lung fields; resonant to percussion Cardiovascular-normal PMI; normal S1 and S2; somewhat irregular rhythm Abdomen-normal bowel sounds; soft and non-tender without masses or organomegaly; umbilical hernia Musculoskeletal-No deformities, no cyanosis or clubbing Neurologic-Normal cranial nerves; symmetric strength and tone Skin-Warm, no significant lesions Extremities-distal pulses intact; no edema  Rhythm Strip: Normal sinus rhythm with sinus arrhythmia; IVCD  ASSESSMENT AND PLAN:  [place] Bing, MD 06/22/2011 1:42 PM

## 2011-06-25 NOTE — Assessment & Plan Note (Signed)
Well-controlled with a modest dose of diuretic.

## 2011-06-25 NOTE — Assessment & Plan Note (Signed)
Control of hypertension has been adequate with current medication, which will be continued.

## 2011-06-25 NOTE — Assessment & Plan Note (Signed)
Patient has done very well with long-standing anticoagulation.  We will continue to monitor CBC and stool Hemoccults for evidence of occult GI blood loss.

## 2011-06-25 NOTE — Assessment & Plan Note (Signed)
Patient is doing well with minimal symptoms.  Current strategy of rate control and anticoagulation will be continued.

## 2011-06-25 NOTE — Assessment & Plan Note (Signed)
Excellent control of hyperlipidemia; however, current dose of simvastatin not advisable in conjunction with treatment with diltiazem.  Simvastatin changed to pravastatin 40 mg per day with a repeat lipid profile in a few months.

## 2011-06-29 ENCOUNTER — Other Ambulatory Visit: Payer: Self-pay | Admitting: Adult Health

## 2011-07-06 ENCOUNTER — Other Ambulatory Visit: Payer: Self-pay | Admitting: Adult Health

## 2011-07-11 ENCOUNTER — Encounter: Payer: Self-pay | Admitting: *Deleted

## 2011-07-13 ENCOUNTER — Telehealth: Payer: Self-pay | Admitting: *Deleted

## 2011-07-13 NOTE — Telephone Encounter (Signed)
As a second reminder, message left with patient's wife to please return stool cards as soon as possible so that they may be tested.

## 2011-07-26 ENCOUNTER — Other Ambulatory Visit: Payer: Self-pay | Admitting: Cardiology

## 2011-08-03 ENCOUNTER — Ambulatory Visit (INDEPENDENT_AMBULATORY_CARE_PROVIDER_SITE_OTHER): Payer: 59 | Admitting: *Deleted

## 2011-08-03 DIAGNOSIS — I4891 Unspecified atrial fibrillation: Secondary | ICD-10-CM

## 2011-08-03 DIAGNOSIS — Z7901 Long term (current) use of anticoagulants: Secondary | ICD-10-CM

## 2011-08-03 LAB — POCT INR: INR: 2.9

## 2011-08-22 ENCOUNTER — Other Ambulatory Visit: Payer: Self-pay | Admitting: Adult Health

## 2011-09-14 ENCOUNTER — Ambulatory Visit (INDEPENDENT_AMBULATORY_CARE_PROVIDER_SITE_OTHER): Payer: 59 | Admitting: *Deleted

## 2011-09-14 DIAGNOSIS — I4891 Unspecified atrial fibrillation: Secondary | ICD-10-CM

## 2011-09-14 DIAGNOSIS — Z7901 Long term (current) use of anticoagulants: Secondary | ICD-10-CM

## 2011-09-26 ENCOUNTER — Other Ambulatory Visit: Payer: Self-pay | Admitting: Cardiology

## 2011-10-26 ENCOUNTER — Ambulatory Visit (INDEPENDENT_AMBULATORY_CARE_PROVIDER_SITE_OTHER): Payer: 59 | Admitting: *Deleted

## 2011-10-26 DIAGNOSIS — Z7901 Long term (current) use of anticoagulants: Secondary | ICD-10-CM

## 2011-10-26 DIAGNOSIS — I4891 Unspecified atrial fibrillation: Secondary | ICD-10-CM

## 2011-12-07 ENCOUNTER — Ambulatory Visit (INDEPENDENT_AMBULATORY_CARE_PROVIDER_SITE_OTHER): Payer: 59 | Admitting: *Deleted

## 2011-12-07 DIAGNOSIS — Z7901 Long term (current) use of anticoagulants: Secondary | ICD-10-CM

## 2011-12-07 DIAGNOSIS — I480 Paroxysmal atrial fibrillation: Secondary | ICD-10-CM

## 2011-12-07 DIAGNOSIS — I4891 Unspecified atrial fibrillation: Secondary | ICD-10-CM

## 2011-12-07 LAB — POCT INR: INR: 3

## 2012-01-01 ENCOUNTER — Other Ambulatory Visit: Payer: Self-pay | Admitting: Cardiology

## 2012-01-14 ENCOUNTER — Emergency Department (HOSPITAL_COMMUNITY): Payer: 59

## 2012-01-14 ENCOUNTER — Emergency Department (HOSPITAL_COMMUNITY)
Admission: EM | Admit: 2012-01-14 | Discharge: 2012-01-14 | Disposition: A | Discharge status: Home / Self Care | Payer: 59 | Attending: Emergency Medicine | Admitting: Emergency Medicine

## 2012-01-14 ENCOUNTER — Encounter (HOSPITAL_COMMUNITY): Payer: Self-pay | Admitting: *Deleted

## 2012-01-14 DIAGNOSIS — M542 Cervicalgia: Secondary | ICD-10-CM | POA: Insufficient documentation

## 2012-01-14 DIAGNOSIS — M549 Dorsalgia, unspecified: Secondary | ICD-10-CM | POA: Insufficient documentation

## 2012-01-14 DIAGNOSIS — Z862 Personal history of diseases of the blood and blood-forming organs and certain disorders involving the immune mechanism: Secondary | ICD-10-CM | POA: Insufficient documentation

## 2012-01-14 DIAGNOSIS — Y929 Unspecified place or not applicable: Secondary | ICD-10-CM | POA: Insufficient documentation

## 2012-01-14 DIAGNOSIS — Y939 Activity, unspecified: Secondary | ICD-10-CM | POA: Insufficient documentation

## 2012-01-14 DIAGNOSIS — I10 Essential (primary) hypertension: Secondary | ICD-10-CM | POA: Insufficient documentation

## 2012-01-14 DIAGNOSIS — R071 Chest pain on breathing: Secondary | ICD-10-CM | POA: Insufficient documentation

## 2012-01-14 DIAGNOSIS — Z79899 Other long term (current) drug therapy: Secondary | ICD-10-CM | POA: Insufficient documentation

## 2012-01-14 DIAGNOSIS — Z8709 Personal history of other diseases of the respiratory system: Secondary | ICD-10-CM | POA: Insufficient documentation

## 2012-01-14 DIAGNOSIS — E785 Hyperlipidemia, unspecified: Secondary | ICD-10-CM | POA: Insufficient documentation

## 2012-01-14 DIAGNOSIS — G8929 Other chronic pain: Secondary | ICD-10-CM | POA: Insufficient documentation

## 2012-01-14 DIAGNOSIS — W010XXA Fall on same level from slipping, tripping and stumbling without subsequent striking against object, initial encounter: Secondary | ICD-10-CM | POA: Insufficient documentation

## 2012-01-14 DIAGNOSIS — T148XXA Other injury of unspecified body region, initial encounter: Secondary | ICD-10-CM | POA: Insufficient documentation

## 2012-01-14 DIAGNOSIS — E119 Type 2 diabetes mellitus without complications: Secondary | ICD-10-CM | POA: Insufficient documentation

## 2012-01-14 DIAGNOSIS — R0789 Other chest pain: Secondary | ICD-10-CM

## 2012-01-14 DIAGNOSIS — I4891 Unspecified atrial fibrillation: Secondary | ICD-10-CM | POA: Insufficient documentation

## 2012-01-14 MED ORDER — METHOCARBAMOL 500 MG PO TABS: 1000.0000 mg | ORAL_TABLET | Freq: Four times a day (QID) | ORAL | Status: DC | PRN

## 2012-01-14 MED ORDER — HYDROCODONE-ACETAMINOPHEN 5-325 MG PO TABS: ORAL_TABLET | ORAL | Status: DC

## 2012-01-14 NOTE — ED Provider Notes (Signed)
History     CSN: [id number]  Arrival date & time 01/14/12  4098   First MD Initiated Contact with Patient 01/14/12 [phone]      Chief Complaint  Patient presents with  . Rib Injury  . Neck Pain     HPI Pt was seen at 0730.  Per pt, c/o gradual onset and persistence of constant left sided ribs and neck "pain" that began 2 days ago.  Pt states his symptoms began after he tripped and fell over a dog gate and caught himself on his extended left arm.  States he did not directly hit his head/neck/back/chest.  Pain worsens with palpation of the area and certain body position changes. Denies palpitations, no cough/SOB, no abd pain, no N/V/D, no focal motor weakness, no tinging/numbness in extremities.    Past Medical History  Diagnosis Date  . Paroxysmal atrial fibrillation     12/2005; sinus bradycardia secondary to medications  . Chronic anticoagulation   . Hypertension     normal coronary angiography and normal EF in 12/98  . Diabetes mellitus, type 2     no insulin  . Chronic back pain   . Nodule of left lung     Granuloma in left upper lobe  . Edema   . Hyperlipidemia     Past Surgical History  Procedure Date  . Appendectomy   . Tonsillectomy      History  Substance Use Topics  . Smoking status: Never Smoker   . Smokeless tobacco: Never Used  . Alcohol Use: No    Review of Systems ROS: Statement: All systems negative except as marked or noted in the HPI; Constitutional: Negative for fever and chills. ; ; Eyes: Negative for eye pain, redness and discharge. ; ; ENMT: Negative for ear pain, hoarseness, nasal congestion, sinus pressure and sore throat. ; ; Cardiovascular: Negative for chest pain, palpitations, diaphoresis, dyspnea and peripheral edema. ; ; Respiratory: Negative for cough, wheezing and stridor. ; ; Gastrointestinal: Negative for nausea, vomiting, diarrhea, abdominal pain, blood in stool, hematemesis, jaundice and rectal bleeding. ; ; Genitourinary: Negative for  dysuria, flank pain and hematuria. ; ; Musculoskeletal: +neck pain, chest wall pain. Negative for back pain. Negative for swelling and trauma.; ; Skin: Negative for pruritus, rash, abrasions, blisters, bruising and skin lesion.; ; Neuro: Negative for headache, lightheadedness and neck stiffness. Negative for weakness, altered level of consciousness , altered mental status, extremity weakness, paresthesias, involuntary movement, seizure and syncope.     Allergies  Review of patient's allergies indicates no known allergies.  Home Medications   Current Outpatient Rx  Name  Route  Sig  Dispense  Refill  . COUMADIN 5 MG PO TABS      TAKE AS DIRECTED BY COUMADIN CLINIC.   60 tablet   3     Dispense as written.   Marland Kitchen DILTIAZEM HCL ER COATED BEADS 180 MG PO CP24      TAKE ONE CAPSULE DAILY.   30 capsule   6   . DOXAZOSIN MESYLATE 4 MG PO TABS      3 tabs DAILY         . GLYBURIDE-METFORMIN 2.5-500 MG PO TABS   Oral   Take 1 tablet by mouth 2 (two) times daily.           Marland Kitchen HYDROCHLOROTHIAZIDE 25 MG PO TABS      TAKE ONE TABLET DAILY.   30 tablet   12   . LISINOPRIL 40 MG PO  TABS   Oral   Take 40 mg by mouth daily.         Marland Kitchen POTASSIUM CHLORIDE CRYS ER 10 MEQ PO TBCR      TAKE ONE TABLET DAILY.   30 tablet   12   . PRAVASTATIN SODIUM 40 MG PO TABS   Oral   Take 1 tablet (40 mg total) by mouth daily.   30 tablet   12   . RANITIDINE HCL 150 MG PO TABS   Oral   Take 150 mg by mouth 2 (two) times daily.           . TEKTURNA 150 MG PO TABS      TAKE ONE TABLET DAILY.   30 each   6     BP 152/82  Pulse 69  Temp 97.6 F (36.4 C) (Oral)  Resp 16  Ht 6\' 1"  (1.854 m)  Wt 230 lb (104.327 kg)  BMI 30.34 kg/m2  SpO2 98%  Physical Exam 0735: Physical examination:  Nursing notes reviewed; Vital signs and O2 SAT reviewed;  Constitutional: Well developed, Well nourished, Well hydrated, In no acute distress; Head:  Normocephalic, atraumatic; Eyes: EOMI, PERRL,  No scleral icterus; ENMT: Mouth and pharynx normal, Mucous membranes moist; Neck: Supple, Full range of motion, No lymphadenopathy; Cardiovascular: Regular rate and rhythm, occasional ectopy. No gallop; Respiratory: Breath sounds clear & equal bilaterally, No rales, rhonchi, wheezes.  Speaking full sentences with ease, Normal respiratory effort/excursion; Chest: +mild left anterior-lateral chest wall tenderness to palp. No soft tissue crepitus, no ecchymosis, no rash. Movement normal; Abdomen: Soft, Nontender, Nondistended, Normal bowel sounds;; Spine:  No midline CS, TS, LS tenderness. +TTP left hypertonic trapezius muscle.;; Extremities: Pulses normal, No tenderness, No edema, No calf edema or asymmetry.; Neuro: AA&Ox3, Major CN grossly intact.  Speech clear. No gross focal motor or sensory deficits in extremities.; Skin: Color normal, Warm, Dry.   ED Course  Procedures    MDM  MDM Reviewed: nursing note and vitals Interpretation: x-ray     Dg Ribs Unilateral W/chest Left 01/14/2012  *RADIOLOGY REPORT*  Clinical Data: Larey Seat.  Pain.  Pain in the left anterior mid to lower ribs.  LEFT RIBS AND CHEST - 3+ VIEW  Comparison: 12/1935  Findings: Heart is enlarged.  There is minimal left lower lobe atelectasis.  No evidence for pneumothorax.  Right upper lobe calcified granuloma is present.  Oblique views are performed of the ribs, showing no acute rib fracture.  IMPRESSION:  1.  Cardiomegaly without pulmonary edema. 2.  Left lower lobe atelectasis. 3.  No evidence for acute, displaced rib fracture.   Original Report Authenticated By: Norva Pavlov, M.D.      [phone]:   No acute fx on CXR.  Will tx symptomatically at this time. Dx and testing d/w pt and family.  Questions answered.  Verb understanding, agreeable to d/c home with outpt f/u.     Laray Anger, DO 01/17/12 [phone]

## 2012-01-14 NOTE — ED Notes (Signed)
Pt fell on Friday night. Now c/o left sided neck pain and pain in left rib cage area upon inhalation.

## 2012-01-18 ENCOUNTER — Ambulatory Visit (INDEPENDENT_AMBULATORY_CARE_PROVIDER_SITE_OTHER): Payer: Medicare Other | Admitting: *Deleted

## 2012-01-18 DIAGNOSIS — Z7901 Long term (current) use of anticoagulants: Secondary | ICD-10-CM

## 2012-01-18 DIAGNOSIS — I4891 Unspecified atrial fibrillation: Secondary | ICD-10-CM

## 2012-01-18 DIAGNOSIS — I480 Paroxysmal atrial fibrillation: Secondary | ICD-10-CM

## 2012-01-31 ENCOUNTER — Ambulatory Visit (INDEPENDENT_AMBULATORY_CARE_PROVIDER_SITE_OTHER): Payer: Medicare Other | Admitting: *Deleted

## 2012-01-31 DIAGNOSIS — Z7901 Long term (current) use of anticoagulants: Secondary | ICD-10-CM

## 2012-01-31 DIAGNOSIS — I4891 Unspecified atrial fibrillation: Secondary | ICD-10-CM

## 2012-01-31 DIAGNOSIS — I480 Paroxysmal atrial fibrillation: Secondary | ICD-10-CM

## 2012-02-20 ENCOUNTER — Other Ambulatory Visit: Payer: Self-pay | Admitting: Adult Health

## 2012-02-20 ENCOUNTER — Other Ambulatory Visit: Payer: Self-pay | Admitting: Cardiology

## 2012-02-20 NOTE — Telephone Encounter (Signed)
rx sent to pharmacy by e-script  

## 2012-03-07 ENCOUNTER — Ambulatory Visit (INDEPENDENT_AMBULATORY_CARE_PROVIDER_SITE_OTHER): Payer: 59 | Admitting: *Deleted

## 2012-03-07 DIAGNOSIS — I4891 Unspecified atrial fibrillation: Secondary | ICD-10-CM

## 2012-03-07 DIAGNOSIS — I480 Paroxysmal atrial fibrillation: Secondary | ICD-10-CM

## 2012-03-07 DIAGNOSIS — Z7901 Long term (current) use of anticoagulants: Secondary | ICD-10-CM

## 2012-03-07 LAB — POCT INR: INR: 3.4

## 2012-04-01 ENCOUNTER — Ambulatory Visit (INDEPENDENT_AMBULATORY_CARE_PROVIDER_SITE_OTHER): Payer: 59 | Admitting: *Deleted

## 2012-04-01 DIAGNOSIS — I4891 Unspecified atrial fibrillation: Secondary | ICD-10-CM

## 2012-04-01 DIAGNOSIS — I480 Paroxysmal atrial fibrillation: Secondary | ICD-10-CM

## 2012-04-01 DIAGNOSIS — Z7901 Long term (current) use of anticoagulants: Secondary | ICD-10-CM

## 2012-04-29 ENCOUNTER — Other Ambulatory Visit: Payer: Self-pay

## 2012-04-29 ENCOUNTER — Emergency Department (HOSPITAL_COMMUNITY): Payer: Medicare Other

## 2012-04-29 ENCOUNTER — Inpatient Hospital Stay (HOSPITAL_COMMUNITY)
Admission: EM | Admit: 2012-04-29 | Discharge: 2012-05-01 | DRG: 310 | Disposition: A | Discharge status: Home / Self Care | Payer: Medicare Other | Attending: Internal Medicine | Admitting: Internal Medicine

## 2012-04-29 ENCOUNTER — Encounter (HOSPITAL_COMMUNITY): Payer: Self-pay | Admitting: *Deleted

## 2012-04-29 ENCOUNTER — Ambulatory Visit (INDEPENDENT_AMBULATORY_CARE_PROVIDER_SITE_OTHER): Payer: 59 | Admitting: *Deleted

## 2012-04-29 DIAGNOSIS — I4891 Unspecified atrial fibrillation: Secondary | ICD-10-CM

## 2012-04-29 DIAGNOSIS — E119 Type 2 diabetes mellitus without complications: Secondary | ICD-10-CM | POA: Diagnosis present

## 2012-04-29 DIAGNOSIS — E669 Obesity, unspecified: Secondary | ICD-10-CM | POA: Diagnosis present

## 2012-04-29 DIAGNOSIS — Z7901 Long term (current) use of anticoagulants: Secondary | ICD-10-CM

## 2012-04-29 DIAGNOSIS — I10 Essential (primary) hypertension: Secondary | ICD-10-CM | POA: Diagnosis present

## 2012-04-29 DIAGNOSIS — I480 Paroxysmal atrial fibrillation: Secondary | ICD-10-CM

## 2012-04-29 DIAGNOSIS — E785 Hyperlipidemia, unspecified: Secondary | ICD-10-CM | POA: Diagnosis present

## 2012-04-29 DIAGNOSIS — Z79899 Other long term (current) drug therapy: Secondary | ICD-10-CM

## 2012-04-29 DIAGNOSIS — Z9861 Coronary angioplasty status: Secondary | ICD-10-CM

## 2012-04-29 DIAGNOSIS — J309 Allergic rhinitis, unspecified: Secondary | ICD-10-CM

## 2012-04-29 DIAGNOSIS — I517 Cardiomegaly: Secondary | ICD-10-CM

## 2012-04-29 DIAGNOSIS — J302 Other seasonal allergic rhinitis: Secondary | ICD-10-CM

## 2012-04-29 DIAGNOSIS — R002 Palpitations: Secondary | ICD-10-CM | POA: Diagnosis present

## 2012-04-29 DIAGNOSIS — Z23 Encounter for immunization: Secondary | ICD-10-CM

## 2012-04-29 DIAGNOSIS — Z6831 Body mass index (BMI) 31.0-31.9, adult: Secondary | ICD-10-CM

## 2012-04-29 LAB — PRO B NATRIURETIC PEPTIDE: Pro B Natriuretic peptide (BNP): 262.9 pg/mL — ABNORMAL HIGH (ref 0–125)

## 2012-04-29 LAB — URINALYSIS, ROUTINE W REFLEX MICROSCOPIC
Bilirubin Urine: NEGATIVE
Hgb urine dipstick: NEGATIVE
Nitrite: NEGATIVE
Specific Gravity, Urine: 1.01 (ref 1.005–1.030)
pH: 6.5 (ref 5.0–8.0)

## 2012-04-29 LAB — CBC WITH DIFFERENTIAL/PLATELET
Eosinophils Absolute: 0 10*3/uL (ref 0.0–0.7)
Eosinophils Relative: 1 % (ref 0–5)
Hemoglobin: 16.1 g/dL (ref 13.0–17.0)
Lymphs Abs: 1.3 10*3/uL (ref 0.7–4.0)
MCH: 29.9 pg (ref 26.0–34.0)
MCV: 87.5 fL (ref 78.0–100.0)
Monocytes Relative: 6 % (ref 3–12)
RBC: 5.38 MIL/uL (ref 4.22–5.81)

## 2012-04-29 LAB — BASIC METABOLIC PANEL
BUN: 17 mg/dL (ref 6–23)
Calcium: 9.6 mg/dL (ref 8.4–10.5)
Creatinine, Ser: 1.13 mg/dL (ref 0.50–1.35)
GFR calc non Af Amer: 66 mL/min — ABNORMAL LOW (ref >90)
Glucose, Bld: 134 mg/dL — ABNORMAL HIGH (ref 70–99)

## 2012-04-29 LAB — GLUCOSE, CAPILLARY: Glucose-Capillary: 103 mg/dL — ABNORMAL HIGH (ref 70–99)

## 2012-04-29 MED ORDER — ONDANSETRON HCL 4 MG/2ML IJ SOLN: 4.0000 mg | Freq: Four times a day (QID) | INTRAMUSCULAR | Status: DC | PRN

## 2012-04-29 MED ORDER — SIMVASTATIN 20 MG PO TABS
20.0000 mg | ORAL_TABLET | Freq: Every day | ORAL | Status: DC
  Administered 2012-04-29: 20 mg via ORAL
  Filled 2012-04-29: qty 1

## 2012-04-29 MED ORDER — ALUM & MAG HYDROXIDE-SIMETH 200-200-20 MG/5ML PO SUSP: 30.0000 mL | Freq: Four times a day (QID) | ORAL | Status: DC | PRN

## 2012-04-29 MED ORDER — HYDROCODONE-ACETAMINOPHEN 5-325 MG PO TABS: 1.0000 | ORAL_TABLET | Freq: Four times a day (QID) | ORAL | Status: DC | PRN

## 2012-04-29 MED ORDER — FLUTICASONE PROPIONATE 50 MCG/ACT NA SUSP
1.0000 | Freq: Every day | NASAL | Status: DC
  Administered 2012-04-29 – 2012-05-01 (×3): 1 via NASAL
  Filled 2012-04-29: qty 16

## 2012-04-29 MED ORDER — ONDANSETRON HCL 4 MG PO TABS: 4.0000 mg | ORAL_TABLET | Freq: Four times a day (QID) | ORAL | Status: DC | PRN

## 2012-04-29 MED ORDER — FAMOTIDINE 20 MG PO TABS
20.0000 mg | ORAL_TABLET | Freq: Two times a day (BID) | ORAL | Status: DC
  Administered 2012-04-29 – 2012-05-01 (×5): 20 mg via ORAL
  Filled 2012-04-29 (×5): qty 1

## 2012-04-29 MED ORDER — METHOCARBAMOL 500 MG PO TABS: 1000.0000 mg | ORAL_TABLET | Freq: Four times a day (QID) | ORAL | Status: DC | PRN

## 2012-04-29 MED ORDER — LEVALBUTEROL HCL 0.63 MG/3ML IN NEBU
0.6300 mg | INHALATION_SOLUTION | Freq: Four times a day (QID) | RESPIRATORY_TRACT | Status: DC | PRN
  Administered 2012-04-29: 0.63 mg via RESPIRATORY_TRACT
  Filled 2012-04-29: qty 3

## 2012-04-29 MED ORDER — METFORMIN HCL 500 MG PO TABS
1000.0000 mg | ORAL_TABLET | Freq: Two times a day (BID) | ORAL | Status: DC
  Administered 2012-04-29 – 2012-05-01 (×4): 1000 mg via ORAL
  Filled 2012-04-29 (×4): qty 2

## 2012-04-29 MED ORDER — DOXAZOSIN MESYLATE 8 MG PO TABS
8.0000 mg | ORAL_TABLET | Freq: Every day | ORAL | Status: DC
  Administered 2012-04-29 – 2012-04-30 (×2): 8 mg via ORAL
  Filled 2012-04-29 (×3): qty 1

## 2012-04-29 MED ORDER — DOCUSATE SODIUM 100 MG PO CAPS
100.0000 mg | ORAL_CAPSULE | Freq: Two times a day (BID) | ORAL | Status: DC
  Administered 2012-04-29 – 2012-05-01 (×5): 100 mg via ORAL
  Filled 2012-04-29 (×5): qty 1

## 2012-04-29 MED ORDER — DILTIAZEM HCL ER COATED BEADS 180 MG PO CP24
180.0000 mg | ORAL_CAPSULE | Freq: Every day | ORAL | Status: DC
  Administered 2012-04-29: 180 mg via ORAL
  Filled 2012-04-29 (×2): qty 1

## 2012-04-29 MED ORDER — SODIUM CHLORIDE 0.9 % IJ SOLN
3.0000 mL | Freq: Two times a day (BID) | INTRAMUSCULAR | Status: DC
  Administered 2012-04-29 – 2012-04-30 (×2): 3 mL via INTRAVENOUS

## 2012-04-29 MED ORDER — WARFARIN SODIUM 5 MG PO TABS: 5.0000 mg | ORAL_TABLET | Freq: Every day | ORAL | Status: DC

## 2012-04-29 MED ORDER — WARFARIN - PHARMACIST DOSING INPATIENT: Freq: Every day | Status: DC

## 2012-04-29 MED ORDER — INSULIN ASPART 100 UNIT/ML ~~LOC~~ SOLN
0.0000 [IU] | Freq: Three times a day (TID) | SUBCUTANEOUS | Status: DC
Start: 2012-04-29 — End: 2012-05-01
  Administered 2012-04-30 – 2012-05-01 (×2): 1 [IU] via SUBCUTANEOUS

## 2012-04-29 MED ORDER — LISINOPRIL 10 MG PO TABS
40.0000 mg | ORAL_TABLET | Freq: Every day | ORAL | Status: DC
  Administered 2012-04-29 – 2012-05-01 (×3): 40 mg via ORAL
  Filled 2012-04-29 (×3): qty 4

## 2012-04-29 MED ORDER — DILTIAZEM HCL 30 MG PO TABS: 180.0000 mg | ORAL_TABLET | Freq: Once | ORAL | Status: DC

## 2012-04-29 MED ORDER — MORPHINE SULFATE 2 MG/ML IJ SOLN: 2.0000 mg | INTRAMUSCULAR | Status: DC | PRN

## 2012-04-29 MED ORDER — ACETAMINOPHEN 650 MG RE SUPP: 650.0000 mg | Freq: Four times a day (QID) | RECTAL | Status: DC | PRN

## 2012-04-29 MED ORDER — DILTIAZEM HCL 100 MG IV SOLR
5.0000 mg/h | Freq: Once | INTRAVENOUS | Status: AC
  Administered 2012-04-29: 5 mg/h via INTRAVENOUS
  Filled 2012-04-29: qty 100

## 2012-04-29 MED ORDER — POTASSIUM CHLORIDE CRYS ER 10 MEQ PO TBCR
10.0000 meq | EXTENDED_RELEASE_TABLET | Freq: Every day | ORAL | Status: DC
  Administered 2012-04-29 – 2012-05-01 (×3): 10 meq via ORAL
  Filled 2012-04-29 (×3): qty 1

## 2012-04-29 MED ORDER — INSULIN ASPART 100 UNIT/ML ~~LOC~~ SOLN: 0.0000 [IU] | Freq: Every day | SUBCUTANEOUS | Status: DC

## 2012-04-29 MED ORDER — LINAGLIPTIN 5 MG PO TABS
5.0000 mg | ORAL_TABLET | Freq: Two times a day (BID) | ORAL | Status: DC
  Administered 2012-04-29 – 2012-05-01 (×4): 5 mg via ORAL
  Filled 2012-04-29 (×5): qty 1

## 2012-04-29 MED ORDER — DILTIAZEM HCL 100 MG IV SOLR
5.0000 mg/h | INTRAVENOUS | Status: DC
  Administered 2012-04-29: 10 mg/h via INTRAVENOUS
  Administered 2012-04-30: 7 mg/h via INTRAVENOUS
  Filled 2012-04-29: qty 100

## 2012-04-29 MED ORDER — WARFARIN SODIUM 7.5 MG PO TABS
7.5000 mg | ORAL_TABLET | Freq: Once | ORAL | Status: AC
  Administered 2012-04-29: 7.5 mg via ORAL
  Filled 2012-04-29 (×2): qty 1

## 2012-04-29 MED ORDER — ACETAMINOPHEN 325 MG PO TABS: 650.0000 mg | ORAL_TABLET | Freq: Four times a day (QID) | ORAL | Status: DC | PRN

## 2012-04-29 MED ORDER — SITAGLIPTIN PHOS-METFORMIN HCL 50-1000 MG PO TABS: 1.0000 | ORAL_TABLET | Freq: Two times a day (BID) | ORAL | Status: DC

## 2012-04-29 MED ORDER — SALINE SPRAY 0.65 % NA SOLN
1.0000 | NASAL | Status: DC | PRN
  Filled 2012-04-29: qty 44

## 2012-04-29 NOTE — H&P (Signed)
[facility] History and Physical  Riley [name] ZOX:[id number] DOB: 12-22-1945 DOA: 04/29/2012  Referring physician: ED physician, Dr. Clarene Duke PCP: Cassell Smiles., MD  Specialists: Cardiologists, Dr. Dietrich Pates  Chief Complaint: Chest palpitations  HPI: Riley [name] is a 67 y.o. male with a history significant for paroxysmal atrial fibrillation, on chronic Coumadin, hypertension, and diabetes mellitus, who presents to the emergency department today with a chief complaint of chest palpitations. At approximately 3:30 AM this morning, he got up to use the bathroom. When he laid back down, he felt fluttering in his chest. He felt his heart racing. He had no associated chest pain, diaphoresis, or shortness of breath. It persisted throughout the morning.  He denies recent fever, chills, nausea, vomiting, or diarrhea. He states that his oral intake has been fairly good. He has had recent nasal congestion and a sinus headache from seasonal allergies. He has been taking Zyrtec without the "D." on and off for the past month or two. However, a day or 2 ago, he started taking an over-the-counter sinus headache medication for which she cannot recall the name of. He is also had more urinary frequency, but no pain with urination. He does not check his blood sugars on her regular basis at home. He takes diltiazem as prescribed daily, but he did not take his dose this morning.  In the emergency department, the patient was noted to be in atrial fibrillation with rapid ventricular rate of 140s to 160s. His EKG is suggestive of atrial fibrillation with a heart rate of 139 beats per minute. His chest x-ray reveals cardiomegaly and vascular congestion. His lab data are significant for a normal potassium, normal magnesium, normal troponin I., mildly elevated pro BNP is 263, therapeutic INR of 2.9, and glucose of 134. He is being admitted for further evaluation and management.  Review of Systems: as above in history  present illness. In addition, he has occasional swelling in his ankles, arthritic pain, nasal congestion, and sinus pressure. Otherwise review of systems is negative.  Past Medical History  Diagnosis Date  . Paroxysmal atrial fibrillation     12/2005; sinus bradycardia secondary to medications  . Chronic anticoagulation   . Hypertension     normal coronary angiography and normal EF in 12/98  . Diabetes mellitus, type 2     no insulin  . Chronic back pain   . Nodule of left lung     Granuloma in left upper lobe  . Edema   . Hyperlipidemia    Past Surgical History  Procedure Laterality Date  . Appendectomy    . Tonsillectomy     Social History: he lives in [place]. He is married. He has 2 children. He is retired. He denies tobacco and illicit drug use. He drinks a glass of wine on occasion.    No Known Allergies  Family history: His father died of complications of heart disease. His mother died of a stroke.  Prior to Admission medications   Medication Sig Start Date End Date Taking? Authorizing Provider  diltiazem (CARDIZEM CD) 180 MG 24 hr capsule TAKE ONE CAPSULE DAILY. 02/20/12  Yes Kathlen Brunswick, MD  doxazosin (CARDURA) 4 MG tablet 3 tabs DAILY   Yes Historical Provider, MD  hydrochlorothiazide (HYDRODIURIL) 25 MG tablet TAKE ONE TABLET DAILY. 07/06/11  Yes Kathlen Brunswick, MD  HYDROcodone-acetaminophen (NORCO/VICODIN) 5-325 MG per tablet 1 tab PO q6 hours prn pain 01/14/12  Yes Laray Anger, DO  lisinopril (PRINIVIL,ZESTRIL) 40 MG tablet  Take 40 mg by mouth daily.   Yes Historical Provider, MD  methocarbamol (ROBAXIN) 500 MG tablet Take 2 tablets (1,000 mg total) by mouth 4 (four) times daily as needed (muscle spasm/pain). 01/14/12  Yes Laray Anger, DO  potassium chloride (K-DUR,KLOR-CON) 10 MEQ tablet TAKE ONE TABLET DAILY. 06/29/11  Yes Kathlen Brunswick, MD  pravastatin (PRAVACHOL) 40 MG tablet Take 1 tablet (40 mg total) by mouth daily. 06/22/11 06/21/12  Yes Kathlen Brunswick, MD  ranitidine (ZANTAC) 150 MG tablet Take 150 mg by mouth 2 (two) times daily.     Yes Historical Provider, MD  sitaGLIPtan-metformin (JANUMET) 50-1000 MG per tablet Take 1 tablet by mouth 2 (two) times daily with a meal.   Yes Historical Provider, MD  TEKTURNA 150 MG tablet TAKE ONE TABLET DAILY. 02/20/12  Yes Kathlen Brunswick, MD  warfarin (COUMADIN) 5 MG tablet Take 5-7.5 mg by mouth daily. Take 5 mg on sun,tues,thurs, and Saturday. Take 7.5 mg on Monday,Wednesday,and Friday.   Yes Historical Provider, MD   Physical Exam: Filed Vitals:   04/29/12 1108 04/29/12 1203 04/29/12 1259 04/29/12 1417  BP: 160/98 140/99 146/98 118/85  Pulse:      Temp:      TempSrc:      Resp: 18 16 16 20   Height:      Weight:      SpO2: 94% 92% 96% 96%     General:  Pleasant alert overweight 67 year old Caucasian man laying in bed, in no acute distress.  Eyes: pupils are equal, round, and reactive to light. Extraocular movements are intact. Conjunctivae are clear. Sclerae are white.  ENT: oropharynx reveals moist membranes. No exudates or erythema. Mild sinus tenderness over his maxillary sinuses, but no erythema. Nasal mucosa is moist with no active rhinorrhea.  Neck: supple, no adenopathy, no thyromegaly, no JVD.  Cardiovascular: irregular, irregular, with tachycardia. Pedal pulses thready.  Respiratory: decreased breath sounds at bases, otherwise clear.  Abdomen: obese, positive bowel sounds, soft, nontender, nondistended.  Skin: good turgor. Lower extremity varicosities noted.  Musculoskeletal: no acute hot red joints. Trace of pedal edema bilaterally.  Psychiatric: he is alert and oriented. Pleasant affect. Speech is clear.  Neurologic: cranial nerves II through XII are intact.  Labs on Admission:  Basic Metabolic Panel:  Recent Labs Lab 04/29/12 1022 04/29/12 1033  NA 139  --   K 3.8  --   CL 102  --   CO2 26  --   GLUCOSE 134*  --   BUN 17  --    CREATININE 1.13  --   CALCIUM 9.6  --   MG  --  1.8   Liver Function Tests: No results found for this basename: AST, ALT, ALKPHOS, BILITOT, PROT, ALBUMIN,  in the last 168 hours No results found for this basename: LIPASE, AMYLASE,  in the last 168 hours No results found for this basename: AMMONIA,  in the last 168 hours CBC:  Recent Labs Lab 04/29/12 1022  WBC 6.5  NEUTROABS 4.8  HGB 16.1  HCT 47.1  MCV 87.5  PLT 171   Cardiac Enzymes:  Recent Labs Lab 04/29/12 1022  TROPONINI <0.30    BNP (last 3 results)  Recent Labs  04/29/12 1022  PROBNP 262.9*   CBG: No results found for this basename: GLUCAP,  in the last 168 hours  Radiological Exams on Admission: Dg Chest Portable 1 View  04/29/2012  *RADIOLOGY REPORT*  Clinical Data: Palpitations.  PORTABLE CHEST - 1  VIEW  Comparison: 01/14/2012  Findings: Mild cardiomegaly.  Low lung volumes with bibasilar atelectasis and vascular congestion.  No overt edema.  No effusions.  No acute bony abnormality.  IMPRESSION: Cardiomegaly, vascular congestion.  Bibasilar atelectasis with low lung volumes.   Original Report Authenticated By: Charlett Nose, M.D.     EKG: suspected lead reversal, heart rate 139 beats per minute.  Assessment/Plan Principal Problem:   Heart palpitations Active Problems:   Atrial fibrillation with RVR   Chronic anticoagulation   Hypertension   Diabetes mellitus, type 2   Hyperlipidemia   Seasonal allergies   1. This is a 67 year old man with known chronic paroxysmal atrial fibrillation. He is chronically anticoagulated with Coumadin. His INR is therapeutic. For rate control, he takes diltiazem at 180 mg daily. Per his history, he missed the dose only this morning, but had been taking it every day as prescribed. His palpitations started early this morning. The trigger for his rapid rate is unknown, but he has recently started taking an over-the-counter medication for his sinus headaches (query a  decongestant) and he has recently restarted taking Zyrtec on a regular basis, both of which may trigger arrhythmias. He also takes a total of 12 mg of Cardura daily which can cause palpitations and arrhythmias.     Plan: 1. Continue diltiazem drip as started in the emergency department. 2. We'll continue lisinopril but will hold Tekturna to avoid possibility of hypotension. 3. We'll decrease Cardura to 8 mg down from 12 mg. We'll watch for worsening urinary frequency or hesitancy. 4. We'll treat his seasonal allergies with steroid nasal spray and as needed saline nasal spray. 5. Continue oral diabetes medications. Was sliding-scale NovoLog. We'll order hemoglobin A1c. 6. For further evaluation, we'll order a cardiac enzymes, TSH, free T4, and 2-D echocardiogram. We'll order a followup EKG. 7. Cardiology consult.    Code Status: Full code Family Communication: none available Disposition Plan: 24-48 hours  Time spent: one hour  [facility] Pager [phone]  If 7PM-7AM, please contact night-coverage [email or web address] Password [email or web address] 04/29/2012, 2:50 PM

## 2012-04-29 NOTE — Progress Notes (Signed)
*  PRELIMINARY RESULTS* Echocardiogram 2D Echocardiogram has been performed.  Conrad Newport 04/29/2012, 3:25 PM

## 2012-04-29 NOTE — ED Notes (Signed)
C/o heart palpitations - states atrial fibrillation - onset 0330 today.  Denies pain, denies shortness of breath.  On medications for a-fib, and this usually keeps his rhythm regular; states has not taken this morning.

## 2012-04-29 NOTE — Progress Notes (Signed)
ANTICOAGULATION CONSULT NOTE - Initial Consult  Pharmacy Consult for Coumadin Indication: atrial fibrillation  No Known Allergies  Patient Measurements: Height: 6\' 1"  (185.4 cm) Weight: 235 lb (106.595 kg) IBW/kg (Calculated) : 79.9  Vital Signs: Temp: 98.1 F (36.7 C) (02/24 1007) Temp src: Oral (02/24 1007) BP: 118/85 mmHg (02/24 1417) Pulse Rate: 141 (02/24 1007)  Labs:  Recent Labs  04/29/12 0936 04/29/12 1022  HGB  --  16.1  HCT  --  47.1  PLT  --  171  INR 2.9  --   CREATININE  --  1.13  TROPONINI  --  <0.30    Estimated Creatinine Clearance: 83.5 ml/min (by C-G formula based on Cr of 1.13).   Medical History: Past Medical History  Diagnosis Date  . Paroxysmal atrial fibrillation     12/2005; sinus bradycardia secondary to medications  . Chronic anticoagulation   . Hypertension     normal coronary angiography and normal EF in 12/98  . Diabetes mellitus, type 2     no insulin  . Chronic back pain   . Nodule of left lung     Granuloma in left upper lobe  . Edema   . Hyperlipidemia     Medications:   (Not in a hospital admission)  Assessment: 67yo male on chronic Warfarin PTA for afib.  INR therapeutic on admission. Goal of Therapy:  INR 2-3 Monitor platelets by anticoagulation protocol: Yes   Plan: Coumadin 7.5mg  today x 1 (per home regimen) INR daily until stable  Valrie Hart A 04/29/2012,3:09 PM

## 2012-04-29 NOTE — ED Provider Notes (Signed)
History     CSN: [id number]  Arrival date & time 04/29/12  4098   First MD Initiated Contact with Patient 04/29/12 1013      Chief Complaint  Patient presents with  . Palpitations     HPI Pt was seen at 1030.   Per pt, c/o gradual onset and persistence of constant palpitations that began this morning approx 0330.  States he got up to go to the bathroom and began to feel the palpitations.  States he is also "urinating a lot" and "more than usual" since overnight last night.  Describes this as "like after I take my water pill."  States he has not taken his usual heart meds this morning.  Denies CP, no SOB/cough, no abd pain, no N/V/D, no back pain, no dysuria/hematuria.      Past Medical History  Diagnosis Date  . Paroxysmal atrial fibrillation     12/2005; sinus bradycardia secondary to medications  . Chronic anticoagulation   . Hypertension     normal coronary angiography and normal EF in 12/98  . Diabetes mellitus, type 2     no insulin  . Chronic back pain   . Nodule of left lung     Granuloma in left upper lobe  . Edema   . Hyperlipidemia     Past Surgical History  Procedure Laterality Date  . Appendectomy    . Tonsillectomy      History  Substance Use Topics  . Smoking status: Never Smoker   . Smokeless tobacco: Never Used  . Alcohol Use: No    Review of Systems ROS: Statement: All systems negative except as marked or noted in the HPI; Constitutional: Negative for fever and chills. ; ; Eyes: Negative for eye pain, redness and discharge. ; ; ENMT: Negative for ear pain, hoarseness, nasal congestion, sinus pressure and sore throat. ; ; Cardiovascular: +palpitations. Negative for chest pain, diaphoresis, dyspnea and peripheral edema. ; ; Respiratory: Negative for cough, wheezing and stridor. ; ; Gastrointestinal: Negative for nausea, vomiting, diarrhea, abdominal pain, blood in stool, hematemesis, jaundice and rectal bleeding. . ; ; Genitourinary: +urinary frequency.  Negative for dysuria, flank pain and hematuria. ; ; Genital:  No penile drainage or rash, no testicular pain or swelling, no scrotal rash or swelling.;;  Musculoskeletal: Negative for back pain and neck pain. Negative for swelling and trauma.; ; Skin: Negative for pruritus, rash, abrasions, blisters, bruising and skin lesion.; ; Neuro: Negative for headache, lightheadedness and neck stiffness. Negative for weakness, altered level of consciousness , altered mental status, extremity weakness, paresthesias, involuntary movement, seizure and syncope.      Allergies  Review of patient's allergies indicates no known allergies.  Home Medications   Current Outpatient Rx  Name  Route  Sig  Dispense  Refill  . COUMADIN 5 MG tablet      TAKE AS DIRECTED BY COUMADIN CLINIC.   60 tablet   3     Dispense as written.   . diltiazem (CARDIZEM CD) 180 MG 24 hr capsule      TAKE ONE CAPSULE DAILY.   30 capsule   5   . doxazosin (CARDURA) 4 MG tablet      3 tabs DAILY         . hydrochlorothiazide (HYDRODIURIL) 25 MG tablet      TAKE ONE TABLET DAILY.   30 tablet   12   . HYDROcodone-acetaminophen (NORCO/VICODIN) 5-325 MG per tablet      1 tab  PO q6 hours prn pain   15 tablet   0   . lisinopril (PRINIVIL,ZESTRIL) 40 MG tablet   Oral   Take 40 mg by mouth daily.         . methocarbamol (ROBAXIN) 500 MG tablet   Oral   Take 2 tablets (1,000 mg total) by mouth 4 (four) times daily as needed (muscle spasm/pain).   25 tablet   0   . potassium chloride (K-DUR,KLOR-CON) 10 MEQ tablet      TAKE ONE TABLET DAILY.   30 tablet   12   . pravastatin (PRAVACHOL) 40 MG tablet   Oral   Take 1 tablet (40 mg total) by mouth daily.   30 tablet   12   . ranitidine (ZANTAC) 150 MG tablet   Oral   Take 150 mg by mouth 2 (two) times daily.           . sitaGLIPtan-metformin (JANUMET) 50-1000 MG per tablet   Oral   Take 1 tablet by mouth 2 (two) times daily with a meal.         .  TEKTURNA 150 MG tablet      TAKE ONE TABLET DAILY.   30 tablet   5     BP 160/98  Pulse 141  Temp(Src) 98.1 F (36.7 C) (Oral)  Resp 18  Ht 6\' 1"  (1.854 m)  Wt 235 lb (106.595 kg)  BMI 31.01 kg/m2  SpO2 94%  Physical Exam 1035: Physical examination:  Nursing notes reviewed; Vital signs and O2 SAT reviewed;  Constitutional: Well developed, Well nourished, Well hydrated, In no acute distress; Head:  Normocephalic, atraumatic; Eyes: EOMI, PERRL, No scleral icterus; ENMT: Mouth and pharynx normal, Mucous membranes moist; Neck: Supple, Full range of motion, No lymphadenopathy; Cardiovascular: Irregular irregular rate and rhythm, HR 110-115 during my exam. No gallop; Respiratory: Breath sounds clear & equal bilaterally, No rales, rhonchi, wheezes.  Speaking full sentences with ease, Normal respiratory effort/excursion; Chest: Nontender, Movement normal; Abdomen: Soft, Nontender, Nondistended, Normal bowel sounds; Genitourinary: No CVA tenderness; Extremities: Pulses normal, No tenderness, No edema, No calf edema or asymmetry.; Neuro: AA&Ox3, Major CN grossly intact.  Speech clear. Climbs on and off stretcher easily by himself. Gait steady. No gross focal motor or sensory deficits in extremities.; Skin: Color normal, Warm, Dry.   ED Course  Procedures   1040:  Pt endorses he has not taken his usual dose of cardizem today, will dose now as HR 110's.    1330:  Pt had been given his usual meds after arrival to the ED.  Pt's HR initially was trending, and sustaining, in the 100's but has now started to increase to 120-140's.  On my re-exam, pt's HR now 140-160's, afib on monitor. Pt continues to deny CP/SOB. Will start IV cardizem bolus and gtt.  Dx and testing d/w pt and family.  Questions answered.  Verb understanding, agreeable to admit.  1400:  Pt now recalls that he has "been taking something over the counter for my sinuses."  Cannot recall the name.  Question if the product contains a  decongestant (ie: sudafed).  T/C to [facility] Dr. Sherrie Mustache, case discussed, including:  HPI, pertinent PM/SHx, VS/PE, dx testing, ED course and treatment:  Agreeable to observation admit, requests to write temporary orders, obtain stepdown bed to team 2.  1440:  Pt's HR now trending back down to 100's while on cardizem gtt.     MDM  MDM Reviewed: previous chart, nursing note and  vitals Reviewed previous: ECG and labs Interpretation: ECG, labs and x-ray Total time providing critical care: 30-74 minutes. This excludes time spent performing separately reportable procedures and services. Consults: admitting MD   CRITICAL CARE Performed by: Laray Anger Total critical care time: 45 Critical care time was exclusive of separately billable procedures and treating other patients. Critical care was necessary to treat or prevent imminent or life-threatening deterioration. Critical care was time spent personally by me on the following activities: development of treatment plan with patient and/or surrogate as well as nursing, discussions with consultants, evaluation of patient's response to treatment, examination of patient, obtaining history from patient or surrogate, ordering and performing treatments and interventions, ordering and review of laboratory studies, ordering and review of radiographic studies, pulse oximetry and re-evaluation of patient's condition.     Date: 04/29/2012  Rate: 139  Rhythm: atrial fibrillation  QRS Axis: left  Intervals: normal  ST/T Wave abnormalities: normal  Conduction Disutrbances:right bundle branch block  Narrative Interpretation:   Old EKG Reviewed: changes noted; previous EKG dated 06/08/2010 was sinus bradycardia.   Results for orders placed during the hospital encounter of 04/29/12  CBC WITH DIFFERENTIAL      Result Value Range   WBC 6.5  4.0 - 10.5 K/uL   RBC 5.38  4.22 - 5.81 MIL/uL   Hemoglobin 16.1  13.0 - 17.0 g/dL   HCT 16.1  09.6 - 04.5 %    MCV 87.5  78.0 - 100.0 fL   MCH 29.9  26.0 - 34.0 pg   MCHC 34.2  30.0 - 36.0 g/dL   RDW 40.9  81.1 - 91.4 %   Platelets 171  150 - 400 K/uL   Neutrophils Relative 73  43 - 77 %   Neutro Abs 4.8  1.7 - 7.7 K/uL   Lymphocytes Relative 20  12 - 46 %   Lymphs Abs 1.3  0.7 - 4.0 K/uL   Monocytes Relative 6  3 - 12 %   Monocytes Absolute 0.4  0.1 - 1.0 K/uL   Eosinophils Relative 1  0 - 5 %   Eosinophils Absolute 0.0  0.0 - 0.7 K/uL   Basophils Relative 0  0 - 1 %   Basophils Absolute 0.0  0.0 - 0.1 K/uL  BASIC METABOLIC PANEL      Result Value Range   Sodium 139  135 - 145 mEq/L   Potassium 3.8  3.5 - 5.1 mEq/L   Chloride 102  96 - 112 mEq/L   CO2 26  19 - 32 mEq/L   Glucose, Bld 134 (*) 70 - 99 mg/dL   BUN 17  6 - 23 mg/dL   Creatinine, Ser 7.82  0.50 - 1.35 mg/dL   Calcium 9.6  8.4 - 95.6 mg/dL   GFR calc non Af Amer 66 (*) >90 mL/min   GFR calc Af Amer 77 (*) >90 mL/min  TROPONIN I      Result Value Range   Troponin I <0.30  <0.30 ng/mL  MAGNESIUM      Result Value Range   Magnesium 1.8  1.5 - 2.5 mg/dL   Dg Chest Portable 1 View 04/29/2012  *RADIOLOGY REPORT*  Clinical Data: Palpitations.  PORTABLE CHEST - 1 VIEW  Comparison: 01/14/2012  Findings: Mild cardiomegaly.  Low lung volumes with bibasilar atelectasis and vascular congestion.  No overt edema.  No effusions.  No acute bony abnormality.  IMPRESSION: Cardiomegaly, vascular congestion.  Bibasilar atelectasis with low lung volumes.   Original  Report Authenticated By: Charlett Nose, M.D.     Results for ROARK, RUFO (MRN [id number]) as of 04/29/2012 11:21  Ref. Range 04/29/2012 09:36  INR No range found 2.9          Laray Anger, DO 04/29/12 1857

## 2012-04-30 DIAGNOSIS — E119 Type 2 diabetes mellitus without complications: Secondary | ICD-10-CM

## 2012-04-30 DIAGNOSIS — Z7901 Long term (current) use of anticoagulants: Secondary | ICD-10-CM

## 2012-04-30 DIAGNOSIS — I10 Essential (primary) hypertension: Secondary | ICD-10-CM

## 2012-04-30 DIAGNOSIS — I4891 Unspecified atrial fibrillation: Principal | ICD-10-CM

## 2012-04-30 LAB — BASIC METABOLIC PANEL
BUN: 16 mg/dL (ref 6–23)
CO2: 27 mEq/L (ref 19–32)
GFR calc non Af Amer: 73 mL/min — ABNORMAL LOW (ref >90)
Glucose, Bld: 126 mg/dL — ABNORMAL HIGH (ref 70–99)
Potassium: 3.7 mEq/L (ref 3.5–5.1)

## 2012-04-30 LAB — GLUCOSE, CAPILLARY
Glucose-Capillary: 144 mg/dL — ABNORMAL HIGH (ref 70–99)
Glucose-Capillary: 105 mg/dL — ABNORMAL HIGH (ref 70–99)
Glucose-Capillary: 116 mg/dL — ABNORMAL HIGH (ref 70–99)

## 2012-04-30 LAB — URINE CULTURE

## 2012-04-30 LAB — TROPONIN I: Troponin I: <0.3 ng/mL (ref <0.30)

## 2012-04-30 LAB — CBC
HCT: 45.7 % (ref 39.0–52.0)
Hemoglobin: 15.7 g/dL (ref 13.0–17.0)
MCH: 30.1 pg (ref 26.0–34.0)
MCHC: 34.4 g/dL (ref 30.0–36.0)
MCV: 87.5 fL (ref 78.0–100.0)
RBC: 5.22 MIL/uL (ref 4.22–5.81)

## 2012-04-30 LAB — HEMOGLOBIN A1C
Hgb A1c MFr Bld: 6.2 % — ABNORMAL HIGH (ref <5.7)
Mean Plasma Glucose: 131 mg/dL — ABNORMAL HIGH (ref <117)

## 2012-04-30 LAB — PROTIME-INR: Prothrombin Time: 25.1 seconds — ABNORMAL HIGH (ref 11.6–15.2)

## 2012-04-30 LAB — TSH: TSH: 1.12 u[IU]/mL (ref 0.350–4.500)

## 2012-04-30 MED ORDER — DILTIAZEM HCL 60 MG PO TABS
60.0000 mg | ORAL_TABLET | Freq: Four times a day (QID) | ORAL | Status: DC
  Administered 2012-04-30 – 2012-05-01 (×5): 60 mg via ORAL
  Filled 2012-04-30 (×5): qty 1

## 2012-04-30 MED ORDER — ATORVASTATIN CALCIUM 10 MG PO TABS
10.0000 mg | ORAL_TABLET | Freq: Every day | ORAL | Status: DC
  Administered 2012-04-30: 10 mg via ORAL
  Filled 2012-04-30: qty 1

## 2012-04-30 MED ORDER — WARFARIN SODIUM 5 MG PO TABS
5.0000 mg | ORAL_TABLET | Freq: Once | ORAL | Status: AC
  Administered 2012-04-30: 5 mg via ORAL
  Filled 2012-04-30: qty 1

## 2012-04-30 NOTE — Consult Note (Signed)
CARDIOLOGY CONSULT NOTE  Patient ID: Riley [name] MRN: [id number] DOB/AGE: 1945-06-06 67 y.o.  Admit date: 04/29/2012 Referring Physician: PTH[facility] Primary PhysicianFUSCO,LAWRENCE J., MD Primary Cardiologist: [place] Bing MD Reason for Consultation: Afib with RVR Principal Problem:   Heart palpitations Active Problems:   Chronic anticoagulation   Hypertension   Diabetes mellitus, type 2   Hyperlipidemia   Atrial fibrillation with RVR   Seasonal allergies  HPI: Riley [name] is a 67 y/o patient admitted with Afib RVR rates in the 160's-170, with known history of chronic atrial fib on coumadin. He was placed on Cardizem drip for rate control. He states he has had a chest cold and a lot of nasal congestion for which he is taking OTC decongestants, but not anything with a "D". This has been going on for a couple of weeks. He was awakened around 3:30 am, got up to use the bathroom and noticed his heart rate racing. Lasted several minutes before he came to ER. Heart rate is now much better controlled on dilt drip. He is on 180 mg daily at home. He also admits to snoring with he and his wife sleeping in separate rooms due to this. He has not had a sleep study. Other history includes hypertension and diabetes.  Cardiac enzymes are found to be negative X 3.   Review of systems complete and found to be negative unless listed above   Past Medical History  Diagnosis Date  . Paroxysmal atrial fibrillation     12/2005; sinus bradycardia secondary to medications  . Chronic anticoagulation   . Hypertension     normal coronary angiography and normal EF in 12/98  . Diabetes mellitus, type 2     no insulin  . Chronic back pain   . Nodule of left lung     Granuloma in left upper lobe  . Edema   . Hyperlipidemia     No family history on file.  History   Social History  . Marital Status: Married    Spouse Name: N/A    Number of Children: N/A  . Years of Education: N/A   Occupational History   . 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 Smoking status: Never Smoker   . Smokeless tobacco: Never Used  . Alcohol Use: No  . Drug Use: No  . Sexually Active:    Other Topics Concern  . Not on file   Social History Narrative  . No narrative on file    Past Surgical History  Procedure Laterality Date  . Appendectomy    . Tonsillectomy       Prescriptions prior to admission  Medication Sig Dispense Refill  . diltiazem (CARDIZEM CD) 180 MG 24 hr capsule TAKE ONE CAPSULE DAILY.  30 capsule  5  . doxazosin (CARDURA) 4 MG tablet 3 tabs DAILY      . hydrochlorothiazide (HYDRODIURIL) 25 MG tablet TAKE ONE TABLET DAILY.  30 tablet  12  . HYDROcodone-acetaminophen (NORCO/VICODIN) 5-325 MG per tablet 1 tab PO q6 hours prn pain  15 tablet  0  . lisinopril (PRINIVIL,ZESTRIL) 40 MG tablet Take 40 mg by mouth daily.      . methocarbamol (ROBAXIN) 500 MG tablet Take 2 tablets (1,000 mg total) by mouth 4 (four) times daily as needed (muscle spasm/pain).  25 tablet  0  . potassium chloride (K-DUR,KLOR-CON) 10 MEQ tablet TAKE ONE TABLET DAILY.  30 tablet  12  . pravastatin (  PRAVACHOL) 40 MG tablet Take 1 tablet (40 mg total) by mouth daily.  30 tablet  12  . ranitidine (ZANTAC) 150 MG tablet Take 150 mg by mouth 2 (two) times daily.        . sitaGLIPtan-metformin (JANUMET) 50-1000 MG per tablet Take 1 tablet by mouth 2 (two) times daily with a meal.      . TEKTURNA 150 MG tablet TAKE ONE TABLET DAILY.  30 tablet  5  . warfarin (COUMADIN) 5 MG tablet Take 5-7.5 mg by mouth daily. Take 5 mg on sun,tues,thurs, and Saturday. Take 7.5 mg on Monday,Wednesday,and Friday.       Echocardiogram 2//24/2014 Left ventricle: The cavity size was normal. Alitzel Cookson thickness was increased in a pattern of moderate LVH. Systolic function was normal. The estimated ejection fraction was in the range of 60% to 65%. Ansley Mangiapane motion was normal; there were no regional Bentlie Withem motion  abnormalities. The study is not technically sufficient to allow evaluation of LV diastolic function. Atrial flutter with variable block noted during the study. No previous exam for comparison. - Mitral valve: Trivial regurgitation. - Left atrium: The atrium was mildly dilated. - Right ventricle: The cavity size was mildly dilated with prominent apical trabeculation. The moderator band was prominent. Systolic function was normal. - Tricuspid valve: Trivial regurgitation. - Pulmonary arteries: Systolic pressure could not be   Physical Exam: Blood pressure 124/83, pulse 75, temperature 97.5 F (36.4 C), temperature source Oral, resp. rate 16, height 6\' 1"  (1.854 m), weight 236 lb 8.9 oz (107.3 kg), SpO2 99.00%.  General: Well developed, well nourished, obese, in no acute distress Head: Eyes PERRLA, No xanthomas.   Normal cephalic and atramatic  Lungs: Clear bilaterally to auscultation , no cough with deep inspirations Heart: HRIR S1 S2, without MRG.  Pulses are 2+ & equal.            No carotid bruit. No JVD. Neck obese No abdominal bruits. No femoral bruits. Abdomen: Bowel sounds are positive, abdomen soft, obese and non-tender without masses or                  Hernia's noted. Msk:  Back normal, normal gait. Normal strength and tone for age. Extremities: No clubbing, cyanosis or edema.  DP +1 Neuro: Alert and oriented X 3. Psych:  Good affect, responds appropriately   Labs:   Lab Results  Component Value Date   WBC 9.2 04/30/2012   HGB 15.7 04/30/2012   HCT 45.7 04/30/2012   MCV 87.5 04/30/2012   PLT 185 04/30/2012    Recent Labs Lab 04/30/12 0237  NA 136  K 3.7  CL 100  CO2 27  BUN 16  CREATININE 1.04  CALCIUM 9.1  GLUCOSE 126*   Lab Results  Component Value Date   TROPONINI <0.30 04/30/2012    Lab Results  Component Value Date   CHOL 98 06/21/2009   CHOL 98 06/21/2009   CHOL 113 07/30/2006   Lab Results  Component Value Date   HDL 37* 06/21/2009   HDL 37  06/21/2009   HDL 47 07/30/2006   Lab Results  Component Value Date   LDLCALC 42 06/21/2009   LDLCALC 42 06/21/2009   LDLCALC 48 07/30/2006   Lab Results  Component Value Date   TRIG 93 06/21/2009   TRIG 93 06/21/2009   Lab Results  Component Value Date   CHOLHDL 2.6 Ratio 06/21/2009   No results found for this basename: LDLDIRECT  Radiology: Dg Chest Portable 1 View  04/29/2012  *RADIOLOGY REPORT*  Clinical Data: Palpitations.  PORTABLE CHEST - 1 VIEW  Comparison: 01/14/2012  Findings: Mild cardiomegaly.  Low lung volumes with bibasilar atelectasis and vascular congestion.  No overt edema.  No effusions.  No acute bony abnormality.  IMPRESSION: Cardiomegaly, vascular congestion.  Bibasilar atelectasis with low lung volumes.   Original Report Authenticated By: Charlett Nose, M.D.    WUJ:WJXBJY flutter 4:1 conduction rate of 76 bpm, RBBB  ASSESSMENT AND PLAN:   1. Atrial fib with RVR: Heart rate is much better controlled on dilt gtt. Will change from IV to po. Will increase home dose to 360 mg daily from 120 mg daily, by giving him 60 mg Q 6 hours and monitor his HR and BP response, adjust as necessary for BP. Also recommend sleep study in the setting of snoring as sleep apnea can contribute to recurrent atrial fibrillation exacerbations. Appt will be made for this as OP with Dr. Vassie Loll in [facility] pulmonology. This has been discussed with the patient who is willing to proceed with follow evaluation.  2. Hypertension: Echocardiogram shows moderate LVH with diastolic function not adequately assessed. He probably has elevated pulmonary pressures with obesity and probably sleep apnea. Further testing as OP is warranted. Continue ACe inhibitor.   3. Diabetes 4. Hyperlipidemia Signed: Bettey Mare. Lyman Bishop NP [facility] 04/30/2012, 8:18 AM Co-Sign MD I have taken a history, reviewed medications, allergies, PMH, SH, FH, and reviewed ROS and examined the patient.  I agree with the assessment  and plan. Discharge patient on 300 mg of Cardizem CD generic equivalent as well as his warfarin. We'll arrange close followup in the office with Dr. Dietrich Pates. In addition we have made an appointment for him to have a sleep evaluation in [place]. The importance of having this evaluation made very clear to patient. He will follow through.  Madelin Weseman C. Daleen Squibb, MD, [facility] Pager:  [phone]

## 2012-04-30 NOTE — Progress Notes (Signed)
[facility] PROGRESS NOTE  Riley [name] ZOX:[id number] DOB: November 23, 1945 DOA: 04/29/2012 PCP: Cassell Smiles., MD  Assessment/Plan: 1. Atrial fibrillation with ventricular response. Appreciate cardiology assistance. His heart rate is improved. He is continued on diltiazem infusion. This will transition to by mouth we will been off Cardizem infusion. He is chronically anticoagulated with Coumadin. 2-D echocardiogram was unremarkable and thyroid studies are normal. Cardiac enzymes are also negative. Will transition to telemetry once he is taken off of Cardizem infusion. Ambulate patient. Possibly home tomorrow  Code Status: full code Family Communication: discussed with patient  Disposition Plan: transfer to telemetry once he is off cardizem infusion   Consultants:  Cardiology  Procedures:  none   Antibiotics:  none  HPI/Subjective: Denies any shortness of breath, chest pain or palpitations  Objective: Filed Vitals:   04/30/12 0815 04/30/12 0830 04/30/12 0845 04/30/12 0943  BP: 117/55 130/82  137/83  Pulse: 81 80 74   Temp:      TempSrc:      Resp: 23 20 19    Height:      Weight:      SpO2: 98% 98% 99%     Intake/Output Summary (Last 24 hours) at 04/30/12 1015 Last data filed at 04/30/12 0830  Gross per 24 hour  Intake 398.33 ml  Output    600 ml  Net -201.67 ml   Filed Weights   04/29/12 1007 04/29/12 1553 04/30/12 0500  Weight: 106.595 kg (235 lb) 107.5 kg (236 lb 15.9 oz) 107.3 kg (236 lb 8.9 oz)    Exam:   General:  NAD  Cardiovascular: S1, S2, irregular  Respiratory: CTA B  Abdomen: soft, nt, nd, bs+  Data Reviewed: Basic Metabolic Panel:  Recent Labs Lab 04/29/12 1022 04/29/12 1033 04/30/12 0237  NA 139  --  136  K 3.8  --  3.7  CL 102  --  100  CO2 26  --  27  GLUCOSE 134*  --  126*  BUN 17  --  16  CREATININE 1.13  --  1.04  CALCIUM 9.6  --  9.1  MG  --  1.8  --    Liver Function Tests: No results found for this basename:  AST, ALT, ALKPHOS, BILITOT, PROT, ALBUMIN,  in the last 168 hours No results found for this basename: LIPASE, AMYLASE,  in the last 168 hours No results found for this basename: AMMONIA,  in the last 168 hours CBC:  Recent Labs Lab 04/29/12 1022 04/30/12 0237  WBC 6.5 9.2  NEUTROABS 4.8  --   HGB 16.1 15.7  HCT 47.1 45.7  MCV 87.5 87.5  PLT 171 185   Cardiac Enzymes:  Recent Labs Lab 04/29/12 1022 04/29/12 1446 04/29/12 2041 04/30/12 0237  TROPONINI <0.30 <0.30 <0.30 <0.30   BNP (last 3 results)  Recent Labs  04/29/12 1022  PROBNP 262.9*   CBG:  Recent Labs Lab 04/29/12 1701 04/29/12 2142 04/30/12 0722  GLUCAP 110* 103* 144*    Recent Results (from the past 240 hour(s))  MRSA PCR SCREENING     Status: None   Collection Time    04/29/12  3:44 PM      Result Value Range Status   MRSA by PCR NEGATIVE  NEGATIVE Final   Comment:            The GeneXpert MRSA Assay (FDA     approved for NASAL specimens     only), is one component of a  comprehensive MRSA colonization     surveillance program. It is not     intended to diagnose MRSA     infection nor to guide or     monitor treatment for     MRSA infections.     Studies: Dg Chest Portable 1 View  04/29/2012  *RADIOLOGY REPORT*  Clinical Data: Palpitations.  PORTABLE CHEST - 1 VIEW  Comparison: 01/14/2012  Findings: Mild cardiomegaly.  Low lung volumes with bibasilar atelectasis and vascular congestion.  No overt edema.  No effusions.  No acute bony abnormality.  IMPRESSION: Cardiomegaly, vascular congestion.  Bibasilar atelectasis with low lung volumes.   Original Report Authenticated By: Charlett Nose, M.D.     Scheduled Meds: . docusate sodium  100 mg Oral BID  . doxazosin  8 mg Oral QHS  . famotidine  20 mg Oral BID  . fluticasone  1 spray Each Nare Daily  . insulin aspart  0-5 Units Subcutaneous QHS  . insulin aspart  0-9 Units Subcutaneous TID WC  . linagliptin  5 mg Oral BID WC   And  .  metFORMIN  1,000 mg Oral BID WC  . lisinopril  40 mg Oral Daily  . potassium chloride  10 mEq Oral Daily  . simvastatin  20 mg Oral q1800  . sodium chloride  3 mL Intravenous Q12H  . Warfarin - Pharmacist Dosing Inpatient   Does not apply q1800   Continuous Infusions: . diltiazem (CARDIZEM) infusion 7 mg/hr (04/30/12 1005)    Principal Problem:   Heart palpitations Active Problems:   Chronic anticoagulation   Hypertension   Diabetes mellitus, type 2   Hyperlipidemia   Atrial fibrillation with RVR   Seasonal allergies    Time spent: 30 mins    MEMON,JEHANZEB  [facility] Pager [phone]. If 8PM-8AM, please contact night-coverage at [email or web address], password [facility] 04/30/2012, 10:15 AM  LOS: 1 day

## 2012-04-30 NOTE — Progress Notes (Signed)
UR Chart Review Completed  

## 2012-04-30 NOTE — Progress Notes (Signed)
ANTICOAGULATION CONSULT NOTE   Pharmacy Consult for Coumadin Indication: atrial fibrillation  No Known Allergies  Patient Measurements: Height: 6\' 1"  (185.4 cm) Weight: 236 lb 8.9 oz (107.3 kg) IBW/kg (Calculated) : 79.9  Vital Signs: Temp: 98.1 F (36.7 C) (02/25 0800) Temp src: Oral (02/25 0800) BP: 111/65 mmHg (02/25 1015) Pulse Rate: 79 (02/25 1015)  Labs:  Recent Labs  04/29/12 1022 04/29/12 1446 04/29/12 2041 04/30/12 0237  HGB 16.1  --   --  15.7  HCT 47.1  --   --  45.7  PLT 171  --   --  185  LABPROT  --   --   --  25.1*  INR  --   --   --  2.41*  CREATININE 1.13  --   --  1.04  TROPONINI <0.30 <0.30 <0.30 <0.30    Estimated Creatinine Clearance: 91 ml/min (by C-G formula based on Cr of 1.04).   Medical History: Past Medical History  Diagnosis Date  . Paroxysmal atrial fibrillation     12/2005; sinus bradycardia secondary to medications  . Chronic anticoagulation   . Hypertension     normal coronary angiography and normal EF in 12/98  . Diabetes mellitus, type 2     no insulin  . Chronic back pain   . Nodule of left lung     Granuloma in left upper lobe  . Edema   . Hyperlipidemia     Medications:  Prescriptions prior to admission  Medication Sig Dispense Refill  . diltiazem (CARDIZEM CD) 180 MG 24 hr capsule TAKE ONE CAPSULE DAILY.  30 capsule  5  . doxazosin (CARDURA) 4 MG tablet 3 tabs DAILY      . hydrochlorothiazide (HYDRODIURIL) 25 MG tablet TAKE ONE TABLET DAILY.  30 tablet  12  . HYDROcodone-acetaminophen (NORCO/VICODIN) 5-325 MG per tablet 1 tab PO q6 hours prn pain  15 tablet  0  . lisinopril (PRINIVIL,ZESTRIL) 40 MG tablet Take 40 mg by mouth daily.      . methocarbamol (ROBAXIN) 500 MG tablet Take 2 tablets (1,000 mg total) by mouth 4 (four) times daily as needed (muscle spasm/pain).  25 tablet  0  . potassium chloride (K-DUR,KLOR-CON) 10 MEQ tablet TAKE ONE TABLET DAILY.  30 tablet  12  . pravastatin (PRAVACHOL) 40 MG tablet Take  1 tablet (40 mg total) by mouth daily.  30 tablet  12  . ranitidine (ZANTAC) 150 MG tablet Take 150 mg by mouth 2 (two) times daily.        . sitaGLIPtan-metformin (JANUMET) 50-1000 MG per tablet Take 1 tablet by mouth 2 (two) times daily with a meal.      . TEKTURNA 150 MG tablet TAKE ONE TABLET DAILY.  30 tablet  5  . warfarin (COUMADIN) 5 MG tablet Take 5-7.5 mg by mouth daily. Take 5 mg on sun,tues,thurs, and Saturday. Take 7.5 mg on Monday,Wednesday,and Friday.        Assessment: 67yo male on chronic Warfarin PTA for afib.  INR therapeutic on admission. Goal of Therapy:  INR 2-3 Monitor platelets by anticoagulation protocol: Yes   Plan: Coumadin 5mg  today x 1 (per home regimen) INR daily until stable  Jeanee Fabre A 04/30/2012,10:30 AM

## 2012-05-01 DIAGNOSIS — R609 Edema, unspecified: Secondary | ICD-10-CM

## 2012-05-01 DIAGNOSIS — R002 Palpitations: Secondary | ICD-10-CM

## 2012-05-01 LAB — GLUCOSE, CAPILLARY

## 2012-05-01 MED ORDER — PNEUMOCOCCAL VAC POLYVALENT 25 MCG/0.5ML IJ INJ: 0.5000 mL | INJECTION | INTRAMUSCULAR | Status: DC

## 2012-05-01 MED ORDER — WARFARIN SODIUM 5 MG PO TABS: 5.0000 mg | ORAL_TABLET | Freq: Once | ORAL | Status: DC

## 2012-05-01 MED ORDER — DILTIAZEM HCL ER COATED BEADS 300 MG PO CP24: 300.0000 mg | ORAL_CAPSULE | Freq: Every day | ORAL | Status: DC

## 2012-05-01 MED ORDER — PNEUMOCOCCAL VAC POLYVALENT 25 MCG/0.5ML IJ INJ
0.5000 mL | INJECTION | Freq: Once | INTRAMUSCULAR | Status: AC
  Administered 2012-05-01: 0.5 mL via INTRAMUSCULAR
  Filled 2012-05-01: qty 0.5

## 2012-05-01 NOTE — Progress Notes (Signed)
ANTICOAGULATION CONSULT NOTE   Pharmacy Consult for Coumadin Indication: atrial fibrillation  No Known Allergies  Patient Measurements: Height: 6\' 1"  (185.4 cm) Weight: 238 lb 12.8 oz (108.319 kg) IBW/kg (Calculated) : 79.9  Vital Signs: Temp: 97.8 F (36.6 C) (02/26 0545) Temp src: Oral (02/26 0545) BP: 120/53 mmHg (02/26 0638) Pulse Rate: 63 (02/26 0638)  Labs:  Recent Labs  04/29/12 1022 04/29/12 1446 04/29/12 2041 04/30/12 0237 05/01/12 0506  HGB 16.1  --   --  15.7  --   HCT 47.1  --   --  45.7  --   PLT 171  --   --  185  --   LABPROT  --   --   --  25.1* 27.6*  INR  --   --   --  2.41* 2.73*  CREATININE 1.13  --   --  1.04  --   TROPONINI <0.30 <0.30 <0.30 <0.30  --     Estimated Creatinine Clearance: 91.4 ml/min (by C-G formula based on Cr of 1.04).   Medical History: Past Medical History  Diagnosis Date  . Paroxysmal atrial fibrillation     12/2005; sinus bradycardia secondary to medications  . Chronic anticoagulation   . Hypertension     normal coronary angiography and normal EF in 12/98  . Diabetes mellitus, type 2     no insulin  . Chronic back pain   . Nodule of left lung     Granuloma in left upper lobe  . Edema   . Hyperlipidemia     Medications:  Prescriptions prior to admission  Medication Sig Dispense Refill  . diltiazem (CARDIZEM CD) 180 MG 24 hr capsule TAKE ONE CAPSULE DAILY.  30 capsule  5  . doxazosin (CARDURA) 4 MG tablet 3 tabs DAILY      . hydrochlorothiazide (HYDRODIURIL) 25 MG tablet TAKE ONE TABLET DAILY.  30 tablet  12  . HYDROcodone-acetaminophen (NORCO/VICODIN) 5-325 MG per tablet 1 tab PO q6 hours prn pain  15 tablet  0  . lisinopril (PRINIVIL,ZESTRIL) 40 MG tablet Take 40 mg by mouth daily.      . methocarbamol (ROBAXIN) 500 MG tablet Take 2 tablets (1,000 mg total) by mouth 4 (four) times daily as needed (muscle spasm/pain).  25 tablet  0  . potassium chloride (K-DUR,KLOR-CON) 10 MEQ tablet TAKE ONE TABLET DAILY.  30  tablet  12  . pravastatin (PRAVACHOL) 40 MG tablet Take 1 tablet (40 mg total) by mouth daily.  30 tablet  12  . ranitidine (ZANTAC) 150 MG tablet Take 150 mg by mouth 2 (two) times daily.        . sitaGLIPtan-metformin (JANUMET) 50-1000 MG per tablet Take 1 tablet by mouth 2 (two) times daily with a meal.      . TEKTURNA 150 MG tablet TAKE ONE TABLET DAILY.  30 tablet  5  . warfarin (COUMADIN) 5 MG tablet Take 5-7.5 mg by mouth daily. Take 5 mg on sun,tues,thurs, and Saturday. Take 7.5 mg on Monday,Wednesday,and Friday.        Assessment: 67yo male on chronic Warfarin PTA for afib.  INR therapeutic on admission.  Remains therapeutic today but trending up. No bleeding noted.   Goal of Therapy:  INR 2-3   Plan: Coumadin 5mg  today x 1  INR daily until stable Recommend d/c home on previous home regimen  Elson Clan 05/01/2012,8:29 AM

## 2012-05-01 NOTE — Progress Notes (Deleted)
Physician Discharge Summary  Riley [name] WUJ:[id number] DOB: 22-Apr-1945 DOA: 04/29/2012  PCP: Cassell Smiles., MD  Admit date: 04/29/2012 Discharge date: 05/01/2012  Time spent: 40 minutes  Recommendations for Outpatient Follow-up:  1. Follow up with primary care doctor in 2 weeks 2. Follow up with cardiologist in 2 weeks 3. Can discuss restarting hydrochlorothiazide and tekturna on outpatient visit 4. Outpatient sleep study has been arranged by cardiology with Dr. Vassie Loll  Discharge Diagnoses:  Principal Problem:   Heart palpitations Active Problems:   Chronic anticoagulation   Hypertension   Diabetes mellitus, type 2   Hyperlipidemia   Atrial fibrillation with RVR   Seasonal allergies   Discharge Condition: improved  Diet recommendation: low salt, low carb  Filed Weights   04/29/12 1553 04/30/12 0500 05/01/12 0545  Weight: 107.5 kg (236 lb 15.9 oz) 107.3 kg (236 lb 8.9 oz) 108.319 kg (238 lb 12.8 oz)    History of present illness:  Riley [name] is a 67 y.o. male with a history significant for paroxysmal atrial fibrillation, on chronic Coumadin, hypertension, and diabetes mellitus, who presents to the emergency department today with a chief complaint of chest palpitations. At approximately 3:30 AM this morning, he got up to use the bathroom. When he laid back down, he felt fluttering in his chest. He felt his heart racing. He had no associated chest pain, diaphoresis, or shortness of breath. It persisted throughout the morning. He denies recent fever, chills, nausea, vomiting, or diarrhea. He states that his oral intake has been fairly good. He has had recent nasal congestion and a sinus headache from seasonal allergies. He has been taking Zyrtec without the "D." on and off for the past month or two. However, a day or 2 ago, he started taking an over-the-counter sinus headache medication for which she cannot recall the name of. He is also had more urinary frequency, but no pain with  urination. He does not check his blood sugars on her regular basis at home. He takes diltiazem as prescribed daily, but he did not take his dose this morning.  In the emergency department, the patient was noted to be in atrial fibrillation with rapid ventricular rate of 140s to 160s. His EKG is suggestive of atrial fibrillation with a heart rate of 139 beats per minute. His chest x-ray reveals cardiomegaly and vascular congestion. His lab data are significant for a normal potassium, normal magnesium, normal troponin I., mildly elevated pro BNP is 263, therapeutic INR of 2.9, and glucose of 134. He is being admitted for further evaluation and management.   Hospital Course:  This gentleman was admitted to the hospital with atrial fibrillation with rapid ventricular response. He has chronic atrial fibrillation and is on Coumadin. He reports compliance with his medications. Workup in the hospital was unrevealing. Thyroid studies, cardiac markers were normal. In 2-D echocardiogram was also unremarkable. It was felt that patient might have sleep apnea which could be contributing to his recurrent atrial fibrillation. Cardiology as arranged a outpatient sleep study with Dr. Vassie Loll. Patient was initially started on a Cardizem infusion, and was subsequently weaned off to oral Cardizem with good rate control. His maintenance dose has been increased from 180 mg to 300 mg daily. He continued on Coumadin. He is ready for discharge home today. He'll followup with cardiology as an outpatient.  Procedures: Echo: Left ventricle: The cavity size was normal. Wall thickness was increased in a pattern of moderate LVH. Systolic function was normal. The estimated ejection fraction  was in the range of 60% to 65%. Wall motion was normal; there were no regional wall motion abnormalities. The study is not technically sufficient to allow evaluation of LV diastolic function. Atrial flutter with variable block noted during the study.  No previous exam for comparison. - Mitral valve: Trivial regurgitation. - Left atrium: The atrium was mildly dilated. - Right ventricle: The cavity size was mildly dilated with prominent apical trabeculation. The moderator band was prominent. Systolic function was normal. - Tricuspid valve: Trivial regurgitation. - Pulmonary arteries: Systolic pressure could not be accurately estimated. - Inferior vena cava: Not well visualized. - Pericardium, extracardiac: There was no pericardial effusion.     Consultations:  [facility]  Discharge Exam: Filed Vitals:   04/30/12 1856 04/30/12 2039 05/01/12 0545 05/01/12 0638  BP:  105/60 137/69 120/53  Pulse: 68 70 75 63  Temp:  97.8 F (36.6 C) 97.8 F (36.6 C)   TempSrc:  Oral Oral   Resp: 16 18 18 20   Height:      Weight:   108.319 kg (238 lb 12.8 oz)   SpO2: 94% 96% 97% 97%    General: NAD Cardiovascular: S1, S2, RRR Respiratory: CTA B  Discharge Instructions  Discharge Orders   Future Appointments Provider Department Dept Phone   05/27/2012 9:20 AM [facility] [place] [phone]   Future Orders Complete By Expires     Call MD for:  difficulty breathing, headache or visual disturbances  As directed     Call MD for:  extreme fatigue  As directed     Call MD for:  persistant dizziness or light-headedness  As directed     Call MD for:  persistant nausea and vomiting  As directed     Diet - low sodium heart healthy  As directed     Increase activity slowly  As directed         Medication List    STOP taking these medications       hydrochlorothiazide 25 MG tablet  Commonly known as:  HYDRODIURIL     potassium chloride 10 MEQ tablet  Commonly known as:  K-DUR,KLOR-CON     TEKTURNA 150 MG tablet  Generic drug:  aliskiren      TAKE these medications       diltiazem 300 MG 24 hr capsule  Commonly known as:  CARDIZEM CD  Take 1 capsule (300 mg total) by mouth daily.      doxazosin 4 MG tablet  Commonly known as:  CARDURA  3 tabs DAILY     HYDROcodone-acetaminophen 5-325 MG per tablet  Commonly known as:  NORCO/VICODIN  1 tab PO q6 hours prn pain     lisinopril 40 MG tablet  Commonly known as:  PRINIVIL,ZESTRIL  Take 40 mg by mouth daily.     methocarbamol 500 MG tablet  Commonly known as:  ROBAXIN  Take 2 tablets (1,000 mg total) by mouth 4 (four) times daily as needed (muscle spasm/pain).     pravastatin 40 MG tablet  Commonly known as:  PRAVACHOL  Take 1 tablet (40 mg total) by mouth daily.     ranitidine 150 MG tablet  Commonly known as:  ZANTAC  Take 150 mg by mouth 2 (two) times daily.     sitaGLIPtan-metformin 50-1000 MG per tablet  Commonly known as:  JANUMET  Take 1 tablet by mouth 2 (two) times daily with a meal.     warfarin 5 MG tablet  Commonly  known as:  COUMADIN  Take 5-7.5 mg by mouth daily. Take 5 mg on sun,tues,thurs, and Saturday.  Take 7.5 mg on Monday,Wednesday,and Friday.           Follow-up Information   Follow up with Cassell Smiles., MD. Schedule an appointment as soon as possible for a visit in 2 weeks.   Contact information:   [address] [place] [address] [phone]       Follow up with Necedah Bing, MD. Schedule an appointment as soon as possible for a visit in 2 weeks.   Contact information:   [address] [place] [address] [phone]        The results of significant diagnostics from this hospitalization (including imaging, microbiology, ancillary and laboratory) are listed below for reference.    Significant Diagnostic Studies: Dg Chest Portable 1 View  04/29/2012  *RADIOLOGY REPORT*  Clinical Data: Palpitations.  PORTABLE CHEST - 1 VIEW  Comparison: 01/14/2012  Findings: Mild cardiomegaly.  Low lung volumes with bibasilar atelectasis and vascular congestion.  No overt edema.  No effusions.  No acute bony abnormality.  IMPRESSION: Cardiomegaly,  vascular congestion.  Bibasilar atelectasis with low lung volumes.   Original Report Authenticated By: Charlett Nose, M.D.     Microbiology: Recent Results (from the past 240 hour(s))  URINE CULTURE     Status: None   Collection Time    04/29/12 12:40 PM      Result Value Range Status   Specimen Description URINE, CLEAN CATCH   Final   Special Requests NONE   Final   Culture  Setup Time 04/29/2012 22:41   Final   Colony Count NO GROWTH   Final   Culture NO GROWTH   Final   Report Status 04/30/2012 FINAL   Final  MRSA PCR SCREENING     Status: None   Collection Time    04/29/12  3:44 PM      Result Value Range Status   MRSA by PCR NEGATIVE  NEGATIVE Final   Comment:            The GeneXpert MRSA Assay (FDA     approved for NASAL specimens     only), is one component of a     comprehensive MRSA colonization     surveillance program. It is not     intended to diagnose MRSA     infection nor to guide or     monitor treatment for     MRSA infections.     Labs: Basic Metabolic Panel:  Recent Labs Lab 04/29/12 1022 04/29/12 1033 04/30/12 0237  NA 139  --  136  K 3.8  --  3.7  CL 102  --  100  CO2 26  --  27  GLUCOSE 134*  --  126*  BUN 17  --  16  CREATININE 1.13  --  1.04  CALCIUM 9.6  --  9.1  MG  --  1.8  --    Liver Function Tests: No results found for this basename: AST, ALT, ALKPHOS, BILITOT, PROT, ALBUMIN,  in the last 168 hours No results found for this basename: LIPASE, AMYLASE,  in the last 168 hours No results found for this basename: AMMONIA,  in the last 168 hours CBC:  Recent Labs Lab 04/29/12 1022 04/30/12 0237  WBC 6.5 9.2  NEUTROABS 4.8  --   HGB 16.1 15.7  HCT 47.1 45.7  MCV 87.5 87.5  PLT 171  185   Cardiac Enzymes:  Recent Labs Lab 04/29/12 1022 04/29/12 1446 04/29/12 2041 04/30/12 0237  TROPONINI <0.30 <0.30 <0.30 <0.30   BNP: BNP (last 3 results)  Recent Labs  04/29/12 1022  PROBNP 262.9*   CBG:  Recent Labs Lab  04/30/12 0722 04/30/12 1133 04/30/12 1617 04/30/12 2038 05/01/12 0749  GLUCAP 144* 100* 105* 116* 124*       Signed:  Rumaisa Schnetzer  [facility] 05/01/2012, 11:29 AM

## 2012-05-01 NOTE — Progress Notes (Signed)
Pt. D/c teaching completed, IV removed, follow up appts. Reviewed, pt. And family's questions answered, and additional needs reviewed. Pt. Escorted to door in wheelchair.

## 2012-05-01 NOTE — Progress Notes (Addendum)
Subjective:  Ready for discharge  Objective:  Vital Signs in the last 24 hours: Temp:  [97.8 F (36.6 C)] 97.8 F (36.6 C) (02/26 0545) Pulse Rate:  [57-118] 63 (02/26 0638) Resp:  [12-25] 20 (02/26 0638) BP: (77-137)/(50-77) 120/53 mmHg (02/26 0638) SpO2:  [94 %-98 %] 97 % (02/26 1610) Weight:  [238 lb 12.8 oz (108.319 kg)] 238 lb 12.8 oz (108.319 kg) (02/26 0545)  Intake/Output from previous day: 02/25 0701 - 02/26 0700 In: 600 [P.O.:600] Out: 200 [Urine:200] Intake/Output from this shift:   Physical Exam: NECK: Without JVD, HJR, or bruit LUNGS: Clear anterior, posterior, lateral HEART: Regular rate and rhythm, no murmur, gallop, rub, bruit, thrill, or heave EXTREMITIES: Without cyanosis, clubbing, or edema  Recent Labs  04/29/12 1022 04/30/12 0237  WBC 6.5 9.2  HGB 16.1 15.7  PLT 171 185    Recent Labs  04/29/12 1022 04/30/12 0237  NA 139 136  K 3.8 3.7  CL 102 100  CO2 26 27  GLUCOSE 134* 126*  BUN 17 16  CREATININE 1.13 1.04    Recent Labs  04/29/12 2041 04/30/12 0237  TROPONINI <0.30 <0.30   Assessment/Plan:  1. Atrial fib with RVR: now in NSR. To be discharge with outpatient follow up with Dr. Dietrich Pates.  2. Hypertension: Echocardiogram shows moderate LVH with diastolic function not adequately assessed. He probably has elevated pulmonary pressures with obesity and probably sleep apnea. Further testing as OP is warranted. Continue ACE inhibitor.  Blood pressure control has been excellent in hospital. 3. Diabetes 4. Hyperlipidemia  Jacolyn Reedy 05/01/2012, 10:13 AM  Cardiology Attending Patient interviewed and examined. Discussed with Jacolyn Reedy, PA-C.  Above note annotated and modified based upon my findings.  Patient has converted from atrial flutter/fibrillation to sinus rhythm.  Agree with plans for discharge today with continuing anticoagulation and a return office visit.  [place] Bing, MD 05/01/2012, 5:24 PM

## 2012-05-01 NOTE — Discharge Summary (Signed)
Physician Discharge Summary  Riley [name] KVQ:[id number] DOB: 07/01/1945 DOA: 04/29/2012  PCP: Cassell Smiles., MD  Admit date: 04/29/2012 Discharge date: 05/01/2012  Time spent: 40 minutes  Recommendations for Outpatient Follow-up:  1. Follow up with primary care doctor in 2 weeks 2. Follow up with cardiologist in 2 weeks 3. Can discuss restarting hydrochlorothiazide and tekturna on outpatient visit 4. Outpatient sleep study has been arranged by cardiology with Dr. Vassie Loll  Discharge Diagnoses:  Active Problems:   Chronic anticoagulation   Hypertension   Diabetes mellitus, type 2   Hyperlipidemia   Seasonal allergies   Discharge Condition: improved  Diet recommendation: low salt, low carb  Filed Weights   04/29/12 1553 04/30/12 0500 05/01/12 0545  Weight: 107.5 kg (236 lb 15.9 oz) 107.3 kg (236 lb 8.9 oz) 108.319 kg (238 lb 12.8 oz)    History of present illness:  Riley [name] is a 67 y.o. male with a history significant for paroxysmal atrial fibrillation, on chronic Coumadin, hypertension, and diabetes mellitus, who presents to the emergency department today with a chief complaint of chest palpitations. At approximately 3:30 AM this morning, he got up to use the bathroom. When he laid back down, he felt fluttering in his chest. He felt his heart racing. He had no associated chest pain, diaphoresis, or shortness of breath. It persisted throughout the morning. He denies recent fever, chills, nausea, vomiting, or diarrhea. He states that his oral intake has been fairly good. He has had recent nasal congestion and a sinus headache from seasonal allergies. He has been taking Zyrtec without the "D." on and off for the past month or two. However, a day or 2 ago, he started taking an over-the-counter sinus headache medication for which she cannot recall the name of. He is also had more urinary frequency, but no pain with urination. He does not check his blood sugars on her regular basis at  home. He takes diltiazem as prescribed daily, but he did not take his dose this morning.  In the emergency department, the patient was noted to be in atrial fibrillation with rapid ventricular rate of 140s to 160s. His EKG is suggestive of atrial fibrillation with a heart rate of 139 beats per minute. His chest x-ray reveals cardiomegaly and vascular congestion. His lab data are significant for a normal potassium, normal magnesium, normal troponin I., mildly elevated pro BNP is 263, therapeutic INR of 2.9, and glucose of 134. He is being admitted for further evaluation and management.   Hospital Course:  This gentleman was admitted to the hospital with atrial fibrillation with rapid ventricular response. He has chronic atrial fibrillation and is on Coumadin. He reports compliance with his medications. Workup in the hospital was unrevealing. Thyroid studies, cardiac markers were normal. In 2-D echocardiogram was also unremarkable. It was felt that patient might have sleep apnea which could be contributing to his recurrent atrial fibrillation. Cardiology as arranged a outpatient sleep study with Dr. Vassie Loll. Patient was initially started on a Cardizem infusion, and was subsequently weaned off to oral Cardizem with good rate control. His maintenance dose has been increased from 180 mg to 300 mg daily. He continued on Coumadin. He is ready for discharge home today. He'll followup with cardiology as an outpatient.  Procedures: Echo: Left ventricle: The cavity size was normal. Wall thickness was increased in a pattern of moderate LVH. Systolic function was normal. The estimated ejection fraction was in the range of 60% to 65%. Wall motion was normal;  there were no regional wall motion abnormalities. The study is not technically sufficient to allow evaluation of LV diastolic function. Atrial flutter with variable block noted during the study. No previous exam for comparison. - Mitral valve: Trivial  regurgitation. - Left atrium: The atrium was mildly dilated. - Right ventricle: The cavity size was mildly dilated with prominent apical trabeculation. The moderator band was prominent. Systolic function was normal. - Tricuspid valve: Trivial regurgitation. - Pulmonary arteries: Systolic pressure could not be accurately estimated. - Inferior vena cava: Not well visualized. - Pericardium, extracardiac: There was no pericardial effusion.     Consultations:  [facility]  Discharge Exam: Filed Vitals:   04/30/12 1856 04/30/12 2039 05/01/12 0545 05/01/12 0638  BP:  105/60 137/69 120/53  Pulse: 68 70 75 63  Temp:  97.8 F (36.6 C) 97.8 F (36.6 C)   TempSrc:  Oral Oral   Resp: 16 18 18 20   Height:      Weight:   108.319 kg (238 lb 12.8 oz)   SpO2: 94% 96% 97% 97%    General: NAD Cardiovascular: S1, S2, RRR Respiratory: CTA B  Discharge Instructions      Discharge Orders   Future Appointments Provider Department Dept Phone   05/17/2012 1:00 PM Jodelle Gross, NP [facility] [place] [phone]   05/27/2012 9:20 AM [facility] [place] [phone]   Future Orders Complete By Expires     Call MD for:  difficulty breathing, headache or visual disturbances  As directed     Call MD for:  extreme fatigue  As directed     Call MD for:  persistant dizziness or light-headedness  As directed     Call MD for:  persistant nausea and vomiting  As directed     Diet - low sodium heart healthy  As directed     Increase activity slowly  As directed         Medication List    STOP taking these medications       hydrochlorothiazide 25 MG tablet  Commonly known as:  HYDRODIURIL     potassium chloride 10 MEQ tablet  Commonly known as:  K-DUR,KLOR-CON     TEKTURNA 150 MG tablet  Generic drug:  aliskiren      TAKE these medications       diltiazem 300 MG 24 hr capsule  Commonly known as:  CARDIZEM CD  Take 1 capsule  (300 mg total) by mouth daily.     doxazosin 4 MG tablet  Commonly known as:  CARDURA  3 tabs DAILY     HYDROcodone-acetaminophen 5-325 MG per tablet  Commonly known as:  NORCO/VICODIN  1 tab PO q6 hours prn pain     lisinopril 40 MG tablet  Commonly known as:  PRINIVIL,ZESTRIL  Take 40 mg by mouth daily.     methocarbamol 500 MG tablet  Commonly known as:  ROBAXIN  Take 2 tablets (1,000 mg total) by mouth 4 (four) times daily as needed (muscle spasm/pain).     pravastatin 40 MG tablet  Commonly known as:  PRAVACHOL  Take 1 tablet (40 mg total) by mouth daily.     ranitidine 150 MG tablet  Commonly known as:  ZANTAC  Take 150 mg by mouth 2 (two) times daily.     sitaGLIPtan-metformin 50-1000 MG per tablet  Commonly known as:  JANUMET  Take 1 tablet by mouth 2 (two) times daily with a meal.  warfarin 5 MG tablet  Commonly known as:  COUMADIN  Take 5-7.5 mg by mouth daily. Take 5 mg on sun,tues,thurs, and Saturday.  Take 7.5 mg on Monday,Wednesday,and Friday.       Follow-up Information   Follow up with Cassell Smiles., MD On 05/14/2012. (at 10:15 am)    Contact information:   [address] [place] [address] [phone]       Follow up with Shiloh Bing, MD On 05/17/2012. (at 1:00 pm)    Contact information:   [address] [place] [address] [phone]        The results of significant diagnostics from this hospitalization (including imaging, microbiology, ancillary and laboratory) are listed below for reference.    Significant Diagnostic Studies: Dg Chest Portable 1 View  04/29/2012  *RADIOLOGY REPORT*  Clinical Data: Palpitations.  PORTABLE CHEST - 1 VIEW  Comparison: 01/14/2012  Findings: Mild cardiomegaly.  Low lung volumes with bibasilar atelectasis and vascular congestion.  No overt edema.  No effusions.  No acute bony abnormality.  IMPRESSION: Cardiomegaly, vascular congestion.  Bibasilar atelectasis with low  lung volumes.   Original Report Authenticated By: Charlett Nose, M.D.     Microbiology: Recent Results (from the past 240 hour(s))  URINE CULTURE     Status: None   Collection Time    04/29/12 12:40 PM      Result Value Range Status   Specimen Description URINE, CLEAN CATCH   Final   Special Requests NONE   Final   Culture  Setup Time 04/29/2012 22:41   Final   Colony Count NO GROWTH   Final   Culture NO GROWTH   Final   Report Status 04/30/2012 FINAL   Final  MRSA PCR SCREENING     Status: None   Collection Time    04/29/12  3:44 PM      Result Value Range Status   MRSA by PCR NEGATIVE  NEGATIVE Final   Comment:            The GeneXpert MRSA Assay (FDA     approved for NASAL specimens     only), is one component of a     comprehensive MRSA colonization     surveillance program. It is not     intended to diagnose MRSA     infection nor to guide or     monitor treatment for     MRSA infections.     Labs: Basic Metabolic Panel:  Recent Labs Lab 04/29/12 1022 04/29/12 1033 04/30/12 0237  NA 139  --  136  K 3.8  --  3.7  CL 102  --  100  CO2 26  --  27  GLUCOSE 134*  --  126*  BUN 17  --  16  CREATININE 1.13  --  1.04  CALCIUM 9.6  --  9.1  MG  --  1.8  --    Liver Function Tests: No results found for this basename: AST, ALT, ALKPHOS, BILITOT, PROT, ALBUMIN,  in the last 168 hours No results found for this basename: LIPASE, AMYLASE,  in the last 168 hours No results found for this basename: AMMONIA,  in the last 168 hours CBC:  Recent Labs Lab 04/29/12 1022 04/30/12 0237  WBC 6.5 9.2  NEUTROABS 4.8  --   HGB 16.1 15.7  HCT 47.1 45.7  MCV 87.5 87.5  PLT 171 185   Cardiac Enzymes:  Recent Labs Lab 04/29/12 1022 04/29/12  1446 04/29/12 2041 04/30/12 0237  TROPONINI <0.30 <0.30 <0.30 <0.30   BNP: BNP (last 3 results)  Recent Labs  04/29/12 1022  PROBNP 262.9*   CBG:  Recent Labs Lab 04/30/12 1133 04/30/12 1617 04/30/12 2038  05/01/12 0749 05/01/12 1128  GLUCAP 100* 105* 116* 124* 95       Signed:  Sahian Kerney  [facility] 05/01/2012, 6:26 PM

## 2012-05-15 ENCOUNTER — Encounter: Payer: Self-pay | Admitting: Physician Assistant

## 2012-05-15 ENCOUNTER — Encounter (HOSPITAL_COMMUNITY): Payer: Self-pay | Admitting: *Deleted

## 2012-05-15 ENCOUNTER — Emergency Department (HOSPITAL_COMMUNITY): Payer: Medicare Other

## 2012-05-15 ENCOUNTER — Ambulatory Visit (INDEPENDENT_AMBULATORY_CARE_PROVIDER_SITE_OTHER): Payer: Medicare Other | Admitting: Physician Assistant

## 2012-05-15 ENCOUNTER — Emergency Department (HOSPITAL_COMMUNITY)
Admission: EM | Admit: 2012-05-15 | Discharge: 2012-05-15 | Disposition: A | Discharge status: Home / Self Care | Payer: Medicare Other | Attending: Emergency Medicine | Admitting: Emergency Medicine

## 2012-05-15 VITALS — BP 118/70 | HR 76 | Ht 73.0 in | Wt 237.1 lb

## 2012-05-15 DIAGNOSIS — G8929 Other chronic pain: Secondary | ICD-10-CM | POA: Insufficient documentation

## 2012-05-15 DIAGNOSIS — I10 Essential (primary) hypertension: Secondary | ICD-10-CM

## 2012-05-15 DIAGNOSIS — I4891 Unspecified atrial fibrillation: Secondary | ICD-10-CM | POA: Insufficient documentation

## 2012-05-15 DIAGNOSIS — Z862 Personal history of diseases of the blood and blood-forming organs and certain disorders involving the immune mechanism: Secondary | ICD-10-CM | POA: Insufficient documentation

## 2012-05-15 DIAGNOSIS — J3489 Other specified disorders of nose and nasal sinuses: Secondary | ICD-10-CM | POA: Insufficient documentation

## 2012-05-15 DIAGNOSIS — R059 Cough, unspecified: Secondary | ICD-10-CM | POA: Insufficient documentation

## 2012-05-15 DIAGNOSIS — Z79899 Other long term (current) drug therapy: Secondary | ICD-10-CM | POA: Insufficient documentation

## 2012-05-15 DIAGNOSIS — R002 Palpitations: Secondary | ICD-10-CM | POA: Insufficient documentation

## 2012-05-15 DIAGNOSIS — Z7901 Long term (current) use of anticoagulants: Secondary | ICD-10-CM | POA: Insufficient documentation

## 2012-05-15 DIAGNOSIS — Z8739 Personal history of other diseases of the musculoskeletal system and connective tissue: Secondary | ICD-10-CM | POA: Insufficient documentation

## 2012-05-15 DIAGNOSIS — E119 Type 2 diabetes mellitus without complications: Secondary | ICD-10-CM | POA: Insufficient documentation

## 2012-05-15 DIAGNOSIS — R079 Chest pain, unspecified: Secondary | ICD-10-CM | POA: Insufficient documentation

## 2012-05-15 DIAGNOSIS — E785 Hyperlipidemia, unspecified: Secondary | ICD-10-CM | POA: Insufficient documentation

## 2012-05-15 DIAGNOSIS — R109 Unspecified abdominal pain: Secondary | ICD-10-CM | POA: Insufficient documentation

## 2012-05-15 LAB — CBC WITH DIFFERENTIAL/PLATELET
Eosinophils Absolute: 0.1 10*3/uL (ref 0.0–0.7)
Eosinophils Relative: 1 % (ref 0–5)
HCT: 45 % (ref 39.0–52.0)
Lymphs Abs: 1.6 10*3/uL (ref 0.7–4.0)
MCH: 30.1 pg (ref 26.0–34.0)
MCV: 87.9 fL (ref 78.0–100.0)
Monocytes Absolute: 0.5 10*3/uL (ref 0.1–1.0)
Platelets: 154 10*3/uL (ref 150–400)
RBC: 5.12 MIL/uL (ref 4.22–5.81)
RDW: 13.6 % (ref 11.5–15.5)

## 2012-05-15 LAB — BASIC METABOLIC PANEL
CO2: 22 mEq/L (ref 19–32)
Calcium: 9.7 mg/dL (ref 8.4–10.5)
Creatinine, Ser: 1.08 mg/dL (ref 0.50–1.35)
Glucose, Bld: 119 mg/dL — ABNORMAL HIGH (ref 70–99)

## 2012-05-15 LAB — TROPONIN I: Troponin I: <0.3 ng/mL (ref <0.30)

## 2012-05-15 MED ORDER — DILTIAZEM HCL ER COATED BEADS 360 MG PO CP24: 360.0000 mg | ORAL_CAPSULE | Freq: Every day | ORAL | Status: DC

## 2012-05-15 MED ORDER — DILTIAZEM HCL 30 MG PO TABS: ORAL_TABLET | ORAL | Status: DC

## 2012-05-15 MED ORDER — DILTIAZEM HCL 100 MG IV SOLR
5.0000 mg/h | INTRAVENOUS | Status: DC
  Administered 2012-05-15: 5 mg/h via INTRAVENOUS
  Filled 2012-05-15: qty 100

## 2012-05-15 NOTE — Patient Instructions (Addendum)
Follow-up with Dr. Dietrich Pates on 06/04/12 at 2:45  Your Cardizem has been increased to 360mg  daily  You have been given a prescription for Cardizem 30mg  to take as needed for tachycardia (palpitations)  Your physician recommends that you schedule a follow-up appointment in: Dr. Vassie Loll in [place] for sleep apnea  Your physician has requested that you regularly monitor and record your blood pressure readings at home daily. Please use the same machine at the same time of day to check your readings and record them to bring to your follow-up visit.

## 2012-05-15 NOTE — Assessment & Plan Note (Signed)
Patient is on Coumadin. 

## 2012-05-15 NOTE — ED Notes (Signed)
Asked by Dr. Bebe Shaggy to call Cor[facility] to change Appt from Friday to an earlier time.  Spoke with Sect. And she changed it to 1pm today.  EDP informed.

## 2012-05-15 NOTE — ED Notes (Signed)
Pt states irregular heart beat noticed at 0130 and then again at 0330 and has remained since. Denies CP or SOB.

## 2012-05-15 NOTE — Progress Notes (Signed)
HPI:    This is a 67 year old male patient with a history of paroxysmal atrial fibrillation requiring several hospitalizations. He was in the emergency room this morning after 6 hours of irregular heartbeat. Initially he was found to be in atrial fibrillation but converted to normal sinus rhythm.  He had an admission in February at which time he was placed on IV Cardizem and sent home on 300 mg daily. He was also asked to make an appointment with Dr. Vassie Loll, [facility] pulmonology for evaluation of sleep apnea. He also has a history of hypertension with 2-D echo showing moderate LVH with diastolic function not adequately they assessed.  The patient states his heart started racing about 1:30 in the morning. He he was up all night and couldn't get comfortable. He finally went to the emergency room at 7 AM at which time his heart rate was 140-170 beats per minute. He was given IV Cardizem and converted to sinus rhythm. This is the first time he's had trouble since his Cardizem was increased to 300 mg daily in February. He states he hasn't missed any doses, denies taking any over-the-counter medications or decongestants, and has not used excessive caffeine.He says he has been bothered a lot by nasal congestion and sinus problems. He still has not seen Dr.Alva as he was waiting for a phone call from their office.  No Known Allergies  Current Outpatient Prescriptions on File Prior to Visit: diltiazem (CARDIZEM CD) 300 MG 24 hr capsule, Take 1 capsule (300 mg total) by mouth daily., Disp: 30 capsule, Rfl: 1 doxazosin (CARDURA) 4 MG tablet, 3 tabs DAILY, Disp: , Rfl:  lisinopril (PRINIVIL,ZESTRIL) 40 MG tablet, Take 40 mg by mouth daily., Disp: , Rfl:  pravastatin (PRAVACHOL) 40 MG tablet, Take 1 tablet (40 mg total) by mouth daily., Disp: 30 tablet, Rfl: 12 ranitidine (ZANTAC) 150 MG tablet, Take 150 mg by mouth 2 (two) times daily.  , Disp: , Rfl:  sitaGLIPtan-metformin (JANUMET) 50-1000 MG per tablet, Take  1 tablet by mouth 2 (two) times daily with a meal., Disp: , Rfl:  warfarin (COUMADIN) 5 MG tablet, Take 5-7.5 mg by mouth daily. Take 5 mg on sun,tues,thurs, and Saturday.Take 7.5 mg on Monday,Wednesday,and Friday., Disp: , Rfl:   Current Facility-Administered Medications on File Prior to Visit: diltiazem (CARDIZEM) 100 mg in dextrose 5 % 100 mL infusion, 5 mg/hr, Intravenous, Titrated, Joya Gaskins, MD, 5 mg/hr at 05/15/12 0741    Past Medical History:   Paroxysmal atrial fibrillation                                 Comment:12/2005; sinus bradycardia secondary to               medications   Chronic anticoagulation                                      Hypertension                                                   Comment:normal coronary angiography and normal EF in               12/98   Diabetes mellitus, type 2  Comment:no insulin   Chronic back pain                                            Nodule of left lung                                            Comment:Granuloma in left upper lobe   Edema                                                        Hyperlipidemia                                              Past Surgical History:   APPENDECTOMY                                                  TONSILLECTOMY                                                No family history on file.   Social History   Marital Status: Married             Spouse Name:                      Years of Education:                 Number of children:             Occupational History Occupation          Associate Professor            Comment              Retired             [employer] officer, museum  Social History Main Topics   Smoking Status: Never Smoker                     Smokeless Status: Never Used                       Alcohol Use: No             Drug Use: No             Sexual Activity:                    Other Topics            Concern   None  on file  Social History Narrative   None on file    ZOX:WRUEAVWU of sleep apnea as well as significant sinus and nasal  congestion that is chronic   PHYSICAL EXAM: Well-nournished, in no acute distress. Neck: No JVD, HJR, Bruit, or thyroid enlargement  Lungs: No tachypnea, clear without wheezing, rales, or rhonchi  Cardiovascular: RRR, PMI not displaced, positive S4, no murmurs,bruit, thrill, or heave.  Abdomen: BS normal. Soft without organomegaly, masses, lesions or tenderness.  Extremities: without cyanosis, clubbing or edema. Good distal pulses bilateral  SKin: Warm, no lesions or rashes   Musculoskeletal: No deformities  Neuro: no focal signs  BP 118/70  Pulse 76  Ht 6\' 1"  (1.854 m)  Wt 237 lb 1.3 oz (107.539 kg)  BMI 31.29 kg/m2  SpO2 98%    ZOX:WRUEAV sinus rhythm with left axis deviation, incomplete right bundle branch block, variable P waves. Dr. Eden Emms reviewed EKG and felt P wave changes could be related to his respiratory problems  Echocardiogram 2//24/2014 Left ventricle: The cavity size was normal. Wall thickness was increased in a pattern of moderate LVH. Systolic function was normal. The estimated ejection fraction was in the range of 60% to 65%. Wall motion was normal; there were no regional wall motion abnormalities. The study is not technically sufficient to allow evaluation of LV diastolic function. Atrial flutter with variable block noted during the study. No previous exam for comparison. - Mitral valve: Trivial regurgitation. - Left atrium: The atrium was mildly dilated. - Right ventricle: The cavity size was mildly dilated with prominent apical trabeculation. The moderator band was prominent. Systolic function was normal. - Tricuspid valve: Trivial regurgitation. - Pulmonary arteries: Systolic pressure could not be

## 2012-05-15 NOTE — Assessment & Plan Note (Signed)
Patient had another emergency room visit for a rapid atrial fibrillation today. He converted to normal sinus rhythm with IV Cardizem. I will increase his daily Cardizem to 360 mg. I will give him additional Cardizem 30 mg to take on a p.r.n. Basis for tachycardia. He will see Dr. Dietrich Pates in the next 2 weeks.

## 2012-05-15 NOTE — ED Provider Notes (Signed)
History     This chart was scribed for Joya Gaskins, MD, MD by Smitty Pluck, ED Scribe. The patient was seen in room APA18/APA18 and the patient's care was started at 7:17 AM.   CSN: [id number]  Arrival date & time 05/15/12  0710    Chief Complaint  Patient presents with  . Irregular Heart Beat     Patient is a 67 y.o. male presenting with palpitations. The history is provided by the patient, the spouse and medical records. No language interpreter was used.  Palpitations  This is a chronic problem. The current episode started 6 to 12 hours ago. The problem occurs daily. The problem has not changed since onset.Associated symptoms include cough. Pertinent negatives include no chest pain and no shortness of breath. Risk factors include diabetes mellitus and being male.   Riley [name] is a 67 y.o. male with hx of HTN, DM and atrial fibrillation who presents to the Emergency Department complaining of intermittent, moderate palpitations onset today 6 hours ago. Pt states he had two episodes of palpitations this AM. Pt reports having moderate burning sensation in his central chest and abdominal pain today. Pt was admitted to [facility] in February for heart palpitations. Pt reports that he takes Cardizem and warfarin. Pt reports that he has cough and nasal congestion. Pt denies fever, chills, nausea, vomiting, LOC, sharp chest pain, diarrhea, weakness, SOB and any other pain. Pt states he has appointment with Dr. Dietrich Pates tomorrow.    Cardiologist is Dr. Dietrich Pates  PCP is Dr. Sherwood Gambler   Past Medical History  Diagnosis Date  . Paroxysmal atrial fibrillation     12/2005; sinus bradycardia secondary to medications  . Chronic anticoagulation   . Hypertension     normal coronary angiography and normal EF in 12/98  . Diabetes mellitus, type 2     no insulin  . Chronic back pain   . Nodule of left lung     Granuloma in left upper lobe  . Edema   . Hyperlipidemia     Past Surgical  History  Procedure Laterality Date  . Appendectomy    . Tonsillectomy      No family history on file.  History  Substance Use Topics  . Smoking status: Never Smoker   . Smokeless tobacco: Never Used  . Alcohol Use: No      Review of Systems  HENT: Positive for congestion.   Respiratory: Positive for cough. Negative for shortness of breath.   Cardiovascular: Positive for palpitations. Negative for chest pain.  Neurological: Negative for syncope.  All other systems reviewed and are negative.    Allergies  Review of patient's allergies indicates no known allergies.  Home Medications   Current Outpatient Rx  Name  Route  Sig  Dispense  Refill  . diltiazem (CARDIZEM CD) 300 MG 24 hr capsule   Oral   Take 1 capsule (300 mg total) by mouth daily.   30 capsule   1   . doxazosin (CARDURA) 4 MG tablet      3 tabs DAILY         . HYDROcodone-acetaminophen (NORCO/VICODIN) 5-325 MG per tablet      1 tab PO q6 hours prn pain   15 tablet   0   . lisinopril (PRINIVIL,ZESTRIL) 40 MG tablet   Oral   Take 40 mg by mouth daily.         . methocarbamol (ROBAXIN) 500 MG tablet   Oral  Take 2 tablets (1,000 mg total) by mouth 4 (four) times daily as needed (muscle spasm/pain).   25 tablet   0   . pravastatin (PRAVACHOL) 40 MG tablet   Oral   Take 1 tablet (40 mg total) by mouth daily.   30 tablet   12   . ranitidine (ZANTAC) 150 MG tablet   Oral   Take 150 mg by mouth 2 (two) times daily.           . sitaGLIPtan-metformin (JANUMET) 50-1000 MG per tablet   Oral   Take 1 tablet by mouth 2 (two) times daily with a meal.         . warfarin (COUMADIN) 5 MG tablet   Oral   Take 5-7.5 mg by mouth daily. Take 5 mg on sun,tues,thurs, and Saturday. Take 7.5 mg on Monday,Wednesday,and Friday.         BP 127/76  Pulse 58  Temp(Src) 98.1 F (36.7 C) (Oral)  Resp 18  Ht 6\' 1"  (1.854 m)  SpO2 96%   BP 164/103  Resp 18  SpO2 96%   Physical Exam   Nursing note and vitals reviewed.  CONSTITUTIONAL: Well developed/well nourished HEAD: Normocephalic/atraumatic EYES: EOMI/PERRL ENMT: Mucous membranes moist NECK: supple no meningeal signs SPINE:entire spine nontender CV: S1/S2 noted, no murmurs/rubs/gallops noted, irregular heart beat, tachycardia  LUNGS: Lungs are clear to auscultation bilaterally, no apparent distress ABDOMEN: soft, nontender, no rebound or guarding GU:no cva tenderness NEURO: Pt is awake/alert, moves all extremitiesx4 EXTREMITIES: pulses normal, full ROM SKIN: warm, color normal PSYCH: no abnormalities of mood noted  ED Course  Procedures  DIAGNOSTIC STUDIES: Oxygen Saturation is 96% on room air, adequate by my interpretation.    COORDINATION OF CARE: 7:25 AM Discussed ED treatment with pt and pt agrees.  Started cardizem.  Pt in no distress  8:19 AM Pt now appears to be in sinus rhythm He is well appearing, no complaints and he is ambulatory He is appropriately anticoagulated. I was able to arrange for cardiology appt later today at 1pm Pt understands this I do not feel he needs further monitoring or cardiac marker rule out as suspicion for ACS is low.  PE is less likely given appearance/history   Labs Reviewed  BASIC METABOLIC PANEL - Abnormal; Notable for the following:    Potassium 3.4 (*)    Glucose, Bld 119 (*)    GFR calc non Af Amer 70 (*)    GFR calc Af Amer 81 (*)    All other components within normal limits  DIGOXIN LEVEL - Abnormal; Notable for the following:    Digoxin Level <0.3 (*)    All other components within normal limits  PROTIME-INR - Abnormal; Notable for the following:    Prothrombin Time 23.6 (*)    INR 2.21 (*)    All other components within normal limits  CBC WITH DIFFERENTIAL  TROPONIN I      MDM  Nursing notes including past medical history and social history reviewed and considered in documentation xrays reviewed and considered Labs/vital reviewed and  considered Previous records reviewed and considered - h/o afib with recent admission      Date: 05/15/2012  Rate: 121  Rhythm: atrial fibrillation  QRS Axis: left  Intervals: normal  ST/T Wave abnormalities: nonspecific ST changes  Conduction Disutrbances:nonspecific intraventricular conduction delay  Narrative Interpretation:   Old EKG Reviewed: h/o afib previously.  morphology appears the same     Date: 05/15/2012 0813am  Rate: 62  Rhythm: normal sinus rhythm  QRS Axis: left  Intervals: normal  ST/T Wave abnormalities: nonspecific ST changes  Conduction Disutrbances:nonspecific intraventricular conduction delay  Narrative Interpretation:   Old EKG Reviewed: pt now in sinus rhythm     I personally performed the services described in this documentation, which was scribed in my presence. The recorded information has been reviewed and is accurate.         Joya Gaskins, MD 05/15/12 [phone]

## 2012-05-15 NOTE — ED Notes (Signed)
Pt stated he is feeling much better. Sitting on side of bed. Wife at bedside.

## 2012-05-15 NOTE — ED Notes (Signed)
Pt walked into hallway and bathroom, stated felt good. Wife at bedside.

## 2012-05-15 NOTE — ED Notes (Signed)
Pt reports that he felt his heart "beating rapid" last night, history of afib, pt is on coumadin.  Stated that his heart "changes over" at night a lot, and he can feel it when it is out of rhythm. Denies any cp, no nausea or vomiting, had been having sinus drainage for last week.   Pt placed on all monitors. Alert and able to answer all ?'s. Is in afib on the monitors.

## 2012-05-15 NOTE — Assessment & Plan Note (Signed)
Blood pressure is stable today. His blood pressure was up at his primary care is on Monday and he was given prescriptions for Lasix, hydrochlorothiazide and potassium. He has not started taking these. I told him to hold off since we are increasing his Cardizem. He is to check his blood pressure daily and call if it is elevated.

## 2012-05-15 NOTE — ED Notes (Signed)
Pt's heartrate, 56-64. cardiazem stopped, md notified. Pt stated he is feeling much better

## 2012-05-17 ENCOUNTER — Encounter: Payer: Medicare Other | Admitting: Adult Health

## 2012-05-27 ENCOUNTER — Ambulatory Visit (INDEPENDENT_AMBULATORY_CARE_PROVIDER_SITE_OTHER): Payer: Medicare Other | Admitting: *Deleted

## 2012-05-27 DIAGNOSIS — I4891 Unspecified atrial fibrillation: Secondary | ICD-10-CM

## 2012-05-27 DIAGNOSIS — I480 Paroxysmal atrial fibrillation: Secondary | ICD-10-CM

## 2012-05-27 DIAGNOSIS — Z7901 Long term (current) use of anticoagulants: Secondary | ICD-10-CM

## 2012-06-04 ENCOUNTER — Encounter: Payer: Self-pay | Admitting: Cardiology

## 2012-06-04 ENCOUNTER — Ambulatory Visit (INDEPENDENT_AMBULATORY_CARE_PROVIDER_SITE_OTHER): Payer: Medicare Other | Admitting: Cardiology

## 2012-06-04 VITALS — BP 170/84 | HR 61 | Ht 73.0 in | Wt 236.0 lb

## 2012-06-04 DIAGNOSIS — I10 Essential (primary) hypertension: Secondary | ICD-10-CM

## 2012-06-04 DIAGNOSIS — I4891 Unspecified atrial fibrillation: Secondary | ICD-10-CM

## 2012-06-04 DIAGNOSIS — I480 Paroxysmal atrial fibrillation: Secondary | ICD-10-CM

## 2012-06-04 DIAGNOSIS — E785 Hyperlipidemia, unspecified: Secondary | ICD-10-CM

## 2012-06-04 DIAGNOSIS — Z7901 Long term (current) use of anticoagulants: Secondary | ICD-10-CM

## 2012-06-04 DIAGNOSIS — R609 Edema, unspecified: Secondary | ICD-10-CM

## 2012-06-04 MED ORDER — ACEBUTOLOL HCL 200 MG PO CAPS: 200.0000 mg | ORAL_CAPSULE | Freq: Two times a day (BID) | ORAL | Status: DC

## 2012-06-04 NOTE — Assessment & Plan Note (Signed)
Patient continues to be symptomatic with episodes of PAF. Acebutolol will be added at low dose in an attempt to control ventricular response in AF but to avoid sinus bradycardia.

## 2012-06-04 NOTE — Assessment & Plan Note (Addendum)
Control of hyperlipidemia was excellent one year ago. We will seek the results of a more recent lipid profile.

## 2012-06-04 NOTE — Patient Instructions (Addendum)
Your physician recommends that you schedule a follow-up appointment in:  1 - 4 months with provider 2 - Blood pressure check with EKG in 2 weeks  Your physician has recommended you make the following change in your medication: START Sectral 200 mg twice a day  Your physician has requested that you regularly monitor and record your blood pressure readings at home. Please use the same machine at the same time of day to check your readings and record them to bring to your follow-up visit.

## 2012-06-04 NOTE — Progress Notes (Signed)
Patient ID: Riley [name], male   DOB: [birth year], 67 y.o.   MRN: [id number]  HPI: Schedule return visit for this nice gentleman with paroxysmal atrial fibrillation and hypertension.  Since his last visit, he has been generally well with few brief episodes of palpitations. He does not find episodes of atrial fibrillation to be terribly aversive.    Current Outpatient Prescriptions  Medication Sig Dispense Refill  . diltiazem (CARDIZEM CD) 360 MG 24 hr capsule Take 1 capsule (360 mg total) by mouth daily.  30 capsule  6  . diltiazem (CARDIZEM) 30 MG tablet Only as needed for tachycardia (palpitations)  30 tablet  3  . doxazosin (CARDURA) 4 MG tablet 3 tabs DAILY      . lisinopril (PRINIVIL,ZESTRIL) 40 MG tablet Take 40 mg by mouth daily.      . pravastatin (PRAVACHOL) 40 MG tablet Take 1 tablet (40 mg total) by mouth daily.  30 tablet  12  . ranitidine (ZANTAC) 150 MG tablet Take 150 mg by mouth 2 (two) times daily.        . sitaGLIPtan-metformin (JANUMET) 50-1000 MG per tablet Take 1 tablet by mouth 2 (two) times daily with a meal.      . warfarin (COUMADIN) 5 MG tablet Take 5-7.5 mg by mouth daily. Take 5 mg on sun,tues,thurs, and Saturday. Take 7.5 mg on Monday,Wednesday,and Friday.      Marland Kitchen acebutolol (SECTRAL) 200 MG capsule Take 1 capsule (200 mg total) by mouth 2 (two) times daily.  60 capsule  6   No current facility-administered medications for this visit.   No Known Allergies   Past medical history, social history, and family history reviewed and updated.  ROS: Denies chest pain, dyspnea, orthopnea or PND. He has mild chronic lower extremity edema that has improved substantially. All other systems reviewed and are negative.  PHYSICAL EXAM: BP 170/84  Pulse 61  Ht 6\' 1"  (1.854 m)  Wt 107.049 kg (236 lb)  BMI 31.14 kg/m2;  Body mass index is 31.14 kg/(m^2). General-Well developed; no acute distress Body habitus-mildly overweight Neck-No JVD; no carotid bruits Lungs-clear lung  fields; resonant to percussion Cardiovascular-normal PMI; normal S1 and S2; modest systolic ejection murmur Abdomen-normal bowel sounds; soft and non-tender without masses or organomegaly Musculoskeletal-No deformities, no cyanosis or clubbing Neurologic-Normal cranial nerves; symmetric strength and tone Skin-Warm, chronic stasis changes in the lower extremities Extremities-distal pulses intact; 1/2+ pedal edema  Riley Bing, MD 06/04/2012  4:38 PM  ASSESSMENT AND PLAN

## 2012-06-04 NOTE — Assessment & Plan Note (Signed)
INR has been stable and therapeutic. We will continue to monitor, perform serial testing to exclude occult GI blood loss and to adjust warfarin dosage as necessary.

## 2012-06-04 NOTE — Assessment & Plan Note (Signed)
Blood pressure control appears to be fairly good. Acebutolol has been added primarily to slow ventricular response in atrial fibrillation in his attempt to relieve palpitations, but could also contribute to blood pressure control. Blood pressure check to be performed in 2 weeks. Patient will monitor home values in the interim.

## 2012-06-04 NOTE — Progress Notes (Deleted)
Name: Riley [name]    DOB: 04/17/1945  Age: 67 y.o.  MR#: [id number]       PCP:  Cassell Smiles., MD      Insurance: Payor: Cleatrice Burke MEDICARE  Plan: AARP MEDICARE COMPLETE  Product Type: *No Product type*    CC:   No chief complaint on file.  MEDICATION LIST ED 05/15/12 ECHO 04/29/12  VS Filed Vitals:   06/04/12 1440  BP: 170/84  Pulse: 61  Height: 6\' 1"  (1.854 m)  Weight: 236 lb (107.049 kg)    Weights Current Weight  06/04/12 236 lb (107.049 kg)  05/15/12 237 lb 1.3 oz (107.539 kg)  05/01/12 238 lb 12.8 oz (108.319 kg)    Blood Pressure  BP Readings from Last 3 Encounters:  06/04/12 170/84  05/15/12 118/70  05/15/12 127/76     Admit date:  (Not on file) Last encounter with RMR:  02/20/2012   Allergy Review of patient's allergies indicates no known allergies.  Current Outpatient Prescriptions  Medication Sig Dispense Refill  . diltiazem (CARDIZEM CD) 360 MG 24 hr capsule Take 1 capsule (360 mg total) by mouth daily.  30 capsule  6  . diltiazem (CARDIZEM) 30 MG tablet Only as needed for tachycardia (palpitations)  30 tablet  3  . doxazosin (CARDURA) 4 MG tablet 3 tabs DAILY      . lisinopril (PRINIVIL,ZESTRIL) 40 MG tablet Take 40 mg by mouth daily.      . pravastatin (PRAVACHOL) 40 MG tablet Take 1 tablet (40 mg total) by mouth daily.  30 tablet  12  . ranitidine (ZANTAC) 150 MG tablet Take 150 mg by mouth 2 (two) times daily.        . sitaGLIPtan-metformin (JANUMET) 50-1000 MG per tablet Take 1 tablet by mouth 2 (two) times daily with a meal.      . warfarin (COUMADIN) 5 MG tablet Take 5-7.5 mg by mouth daily. Take 5 mg on sun,tues,thurs, and Saturday. Take 7.5 mg on Monday,Wednesday,and Friday.       No current facility-administered medications for this visit.    Discontinued Meds:   There are no discontinued medications.  Patient Active Problem List  Diagnosis  . Edema  . Paroxysmal atrial fibrillation  . Chronic anticoagulation  . Hypertension   . Diabetes mellitus, type 2  . Hyperlipidemia  . Seasonal allergies    LABS    Component Value Date/Time   NA 137 05/15/2012 0721   NA 136 04/30/2012 0237   NA 139 04/29/2012 1022   K 3.4* 05/15/2012 0721   K 3.7 04/30/2012 0237   K 3.8 04/29/2012 1022   CL 100 05/15/2012 0721   CL 100 04/30/2012 0237   CL 102 04/29/2012 1022   CO2 22 05/15/2012 0721   CO2 27 04/30/2012 0237   CO2 26 04/29/2012 1022   GLUCOSE 119* 05/15/2012 0721   GLUCOSE 126* 04/30/2012 0237   GLUCOSE 134* 04/29/2012 1022   BUN 19 05/15/2012 0721   BUN 16 04/30/2012 0237   BUN 17 04/29/2012 1022   CREATININE 1.08 05/15/2012 0721   CREATININE 1.04 04/30/2012 0237   CREATININE 1.13 04/29/2012 1022   CALCIUM 9.7 05/15/2012 0721   CALCIUM 9.1 04/30/2012 0237   CALCIUM 9.6 04/29/2012 1022   GFRNONAA 70* 05/15/2012 0721   GFRNONAA 73* 04/30/2012 0237   GFRNONAA 66* 04/29/2012 1022   GFRAA 81* 05/15/2012 0721   GFRAA 85* 04/30/2012 0237   GFRAA 77* 04/29/2012 1022   CMP  Component Value Date/Time   NA 137 05/15/2012 0721   K 3.4* 05/15/2012 0721   CL 100 05/15/2012 0721   CO2 22 05/15/2012 0721   GLUCOSE 119* 05/15/2012 0721   BUN 19 05/15/2012 0721   CREATININE 1.08 05/15/2012 0721   CALCIUM 9.7 05/15/2012 0721   PROT 6.3 06/21/2009 1858   ALBUMIN 4.4 06/21/2009 1858   AST 14 06/21/2009 1858   ALT 19 06/21/2009 1858   ALKPHOS 41 06/21/2009 1858   BILITOT 0.7 06/21/2009 1858   GFRNONAA 70* 05/15/2012 0721   GFRAA 81* 05/15/2012 0721       Component Value Date/Time   WBC 7.4 05/15/2012 0721   WBC 9.2 04/30/2012 0237   WBC 6.5 04/29/2012 1022   HGB 15.4 05/15/2012 0721   HGB 15.7 04/30/2012 0237   HGB 16.1 04/29/2012 1022   HCT 45.0 05/15/2012 0721   HCT 45.7 04/30/2012 0237   HCT 47.1 04/29/2012 1022   MCV 87.9 05/15/2012 0721   MCV 87.5 04/30/2012 0237   MCV 87.5 04/29/2012 1022    Lipid Panel     Component Value Date/Time   CHOL 98 06/21/2009 1858   TRIG 93 06/21/2009 1858   HDL 37* 06/21/2009 1858   CHOLHDL 2.6 Ratio 06/21/2009  1858   VLDL 19 06/21/2009 1858   LDLCALC 42 06/21/2009 1858   LDLCALC 48 07/30/2006    ABG No results found for this basename: phart, pco2, pco2art, po2, po2art, hco3, tco2, acidbasedef, o2sat     Lab Results  Component Value Date   TSH 1.120 04/29/2012   BNP (last 3 results)  Recent Labs  04/29/12 1022  PROBNP 262.9*   Cardiac Panel (last 3 results) No results found for this basename: CKTOTAL, CKMB, TROPONINI, RELINDX,  in the last 72 hours  Iron/TIBC/Ferritin No results found for this basename: iron, tibc, ferritin     EKG Orders placed in visit on 06/04/12  . EKG 12-LEAD     Prior Assessment and Plan Problem List as of 06/04/2012     ICD-9-CM     Cardiology Problems   Paroxysmal atrial fibrillation   Last Assessment & Plan   05/15/2012 Office Visit Written 05/15/2012 11:37 AM by Dyann Kief, PA-C     Patient had another emergency room visit for a rapid atrial fibrillation today. He converted to normal sinus rhythm with IV Cardizem. I will increase his daily Cardizem to 360 mg. I will give him additional Cardizem 30 mg to take on a p.r.n. Basis for tachycardia. He will see Dr. Dietrich Pates in the next 2 weeks.    Hypertension   Last Assessment & Plan   05/15/2012 Office Visit Written 05/15/2012 11:38 AM by Dyann Kief, PA-C     Blood pressure is stable today. His blood pressure was up at his primary care is on Monday and he was given prescriptions for Lasix, hydrochlorothiazide and potassium. He has not started taking these. I told him to hold off since we are increasing his Cardizem. He is to check his blood pressure daily and call if it is elevated.    Hyperlipidemia   Last Assessment & Plan   06/22/2011 Office Visit Written 06/25/2011  9:38 AM by Kathlen Brunswick, MD     Excellent control of hyperlipidemia; however, current dose of simvastatin not advisable in conjunction with treatment with diltiazem.  Simvastatin changed to pravastatin 40 mg per day with a repeat  lipid profile in a few months.      Other  Edema   Last Assessment & Plan   06/22/2011 Office Visit Written 06/25/2011  9:35 AM by Kathlen Brunswick, MD     Well-controlled with a modest dose of diuretic.    Chronic anticoagulation   Last Assessment & Plan   05/15/2012 Office Visit Written 05/15/2012 11:38 AM by Dyann Kief, PA-C     Patient is on Coumadin    Diabetes mellitus, type 2   Seasonal allergies       Imaging: Dg Chest Portable 1 View  05/15/2012  *RADIOLOGY REPORT*  Clinical Data: Chest pain  PORTABLE CHEST - 1 VIEW  Comparison: 04/29/2012  Findings: Borderline cardiomegaly.  Low volumes.  Calcified granuloma in the right upper lobe.  Otherwise lungs are grossly clear.  No pneumothorax.  IMPRESSION: No active cardiopulmonary disease.   Original Report Authenticated By: Jolaine Click, M.D.

## 2012-06-04 NOTE — Assessment & Plan Note (Signed)
Adequately controlled with current medication. Electrolytes and renal function will be periodically monitored.

## 2012-06-06 ENCOUNTER — Telehealth: Payer: Self-pay | Admitting: Cardiology

## 2012-06-06 MED ORDER — ACEBUTOLOL HCL 200 MG PO CAPS: 200.0000 mg | ORAL_CAPSULE | Freq: Every day | ORAL | Status: DC

## 2012-06-06 NOTE — Telephone Encounter (Signed)
Discussed case with Dr Dietrich Pates, who advised for him to cut Sectrol to 200 mg once daily and continue to monitor vital signs.  Wife verbalized understanding.

## 2012-06-06 NOTE — Telephone Encounter (Signed)
Patient's BP is 126/67 and HR is 45.  Patient's wife is worried that HR is too low. / tgs

## 2012-06-10 ENCOUNTER — Telehealth: Payer: Self-pay | Admitting: Cardiology

## 2012-06-10 ENCOUNTER — Other Ambulatory Visit: Payer: Self-pay | Admitting: Adult Health

## 2012-06-10 NOTE — Telephone Encounter (Signed)
Pt's BP reading : 4/5 - 141/77 HR - 46 on 4/6 - 164/77 HR - 43 on 4/7 - 160/72 HR - 47 / tgs

## 2012-06-10 NOTE — Telephone Encounter (Signed)
Continues to have inadequate blood pressures and bradycardia.  Will bring patient in for EKG and Blood pressure check on Wednesday.  No acute distress noted, per wife.

## 2012-06-12 ENCOUNTER — Ambulatory Visit (INDEPENDENT_AMBULATORY_CARE_PROVIDER_SITE_OTHER): Payer: Medicare Other | Admitting: *Deleted

## 2012-06-12 ENCOUNTER — Telehealth: Payer: Self-pay | Admitting: *Deleted

## 2012-06-12 VITALS — BP 122/72 | HR 44 | Wt 237.0 lb

## 2012-06-12 DIAGNOSIS — I480 Paroxysmal atrial fibrillation: Secondary | ICD-10-CM

## 2012-06-12 DIAGNOSIS — I4891 Unspecified atrial fibrillation: Secondary | ICD-10-CM

## 2012-06-12 MED ORDER — VERAPAMIL HCL ER 360 MG PO CP24: 360.0000 mg | ORAL_CAPSULE | Freq: Every day | ORAL | Status: DC

## 2012-06-12 NOTE — Progress Notes (Signed)
Presents for EKG and Blood pressure check s/p initiation of Sectral 200 mg bid.  Patient has had HR in the 40's since, therefore Sectral was decreased to daily on 4/3.  Patient continues to have HR of 44 today.  States he feels well, despite low rate.

## 2012-06-12 NOTE — Telephone Encounter (Signed)
Per Dr Dietrich Pates, stop diltiazem and start verapamil and keep scheduled BP check in 2 weeks

## 2012-06-17 ENCOUNTER — Institutional Professional Consult (permissible substitution): Payer: Medicare Other | Admitting: Pulmonary Disease

## 2012-06-20 NOTE — Progress Notes (Signed)
Patient ID: Riley [name], male   DOB: 07/01/1945, 67 y.o.   MRN: [id number]  Continue current Rx.

## 2012-06-24 ENCOUNTER — Ambulatory Visit (INDEPENDENT_AMBULATORY_CARE_PROVIDER_SITE_OTHER): Payer: Medicare Other | Admitting: *Deleted

## 2012-06-24 ENCOUNTER — Encounter: Payer: Self-pay | Admitting: *Deleted

## 2012-06-24 VITALS — BP 154/84 | HR 48 | Ht 73.0 in | Wt 237.8 lb

## 2012-06-24 DIAGNOSIS — Z7901 Long term (current) use of anticoagulants: Secondary | ICD-10-CM

## 2012-06-24 DIAGNOSIS — I480 Paroxysmal atrial fibrillation: Secondary | ICD-10-CM

## 2012-06-24 DIAGNOSIS — R0989 Other specified symptoms and signs involving the circulatory and respiratory systems: Secondary | ICD-10-CM

## 2012-06-24 DIAGNOSIS — I10 Essential (primary) hypertension: Secondary | ICD-10-CM

## 2012-06-24 DIAGNOSIS — I4891 Unspecified atrial fibrillation: Secondary | ICD-10-CM

## 2012-06-24 LAB — POCT INR: INR: 2.5

## 2012-06-24 NOTE — Patient Instructions (Addendum)
Your physician recommends that you schedule a follow-up appointment in: PT WAS ADVISED VIA CP LPN WE WILL CONTACT PT IF ANY FURTHER CHANGES NOTED BY DR RR

## 2012-06-24 NOTE — Progress Notes (Signed)
Blood pressure check as planned; modification of medical regime 2 weeks ago. Severe sinus bradycardia with beta blocker therapy.  No symptoms reported  LAST OV:   BP Pulse Wt BMI       122/72 44 237 lb (107.502 kg) 31.27 kg/m2        Presents for EKG and Blood pressure check s/p initiation of Sectral 200 mg bid. Patient has had HR in the 40's since, therefore Sectral was decreased to daily on 4/3. Patient continues to have HR of 44 today. States he feels well, despite low rate.       Cardiology Attending Physician  Bradycardia persists, but heart rate somewhat higher. Patient remains asymptomatic and is tolerating low-dose beta blocker, which can be continued. Controlled blood pressure is slightly suboptimal, but improved.  Chlorthalidone 12.5 mg per day. Bmet in 3 weeks.   Bing, M.D. 06/30/2012  1:16 PM

## 2012-06-26 ENCOUNTER — Ambulatory Visit (INDEPENDENT_AMBULATORY_CARE_PROVIDER_SITE_OTHER): Payer: Medicare Other | Admitting: Pulmonary Disease

## 2012-06-26 ENCOUNTER — Encounter: Payer: Self-pay | Admitting: Pulmonary Disease

## 2012-06-26 VITALS — BP 140/80 | HR 42 | Temp 97.4°F | Ht 73.0 in | Wt 236.8 lb

## 2012-06-26 DIAGNOSIS — G4733 Obstructive sleep apnea (adult) (pediatric): Secondary | ICD-10-CM | POA: Insufficient documentation

## 2012-06-26 NOTE — Assessment & Plan Note (Signed)
Given excessive daytime somnolence, narrow pharyngeal exam, witnessed apneas & loud snoring, obstructive sleep apnea is very likely & an overnight polysomnogram will be scheduled as a split study. The pathophysiology of obstructive sleep apnea , it's cardiovascular consequences & modes of treatment including CPAP were discused with the patient in detail & they evidenced understanding.   Sleep study  We discussed effect of OSA on atrial fibrillation

## 2012-06-26 NOTE — Patient Instructions (Signed)
Sleep study  We discussed effect of OSA on atrial fibrillation

## 2012-06-26 NOTE — Progress Notes (Signed)
Subjective:    Patient ID: Riley [name], male    DOB: 1945-12-17, 67 y.o.   MRN: [id number]  HPI  67 year old never smoker but atrial fibrillation referred for evaluation of obstructive sleep apnea. He had an admission for atrial fibrillation and was observed to have apneas and snoring. Wife also reports loud snoring. She is a light sleeper and is moved to different bedroom for the past few years. He reports dryness of mouth and waking up in the morning with palpitations. He is maintained on a regimen of Acebutalol and Cardizem as needed for palpitations to avoid ER visits. Takes Nasonex for nasal stuffiness. 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 Bedtime is 11:30 to midnight, sleep latency is up to an hour, he sleeps on her side with one pillow, has 2-3 awakenings for nocturia and is out of bed at 7:30 AM with an occasional sinus headache and dryness of mouth. He denies recent weight change and admits to difficulty getting adjusted to a time regimen. Epworth sleepiness score is 12/24 and he is often found napping in his recliner There is no history suggestive of cataplexy, sleep paralysis or parasomnias   Past Medical History  Diagnosis Date  . Paroxysmal atrial fibrillation     12/2005; sinus bradycardia secondary to medications  . Chronic anticoagulation   . Hypertension     normal coronary angiography and normal EF in 12/98  . Diabetes mellitus, type 2     no insulin  . Chronic back pain   . Nodule of left lung     Granuloma in left upper lobe  . Edema   . Hyperlipidemia     Past Surgical History  Procedure Laterality Date  . Appendectomy  1998  . Tonsillectomy      as a child   No Known Allergies   History   Social History  . Marital Status: Married    Spouse Name: N/A    Number of Children: N/A  . Years of Education: N/A   Occupational History  . 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 Smoking  status: Never Smoker   . Smokeless tobacco: Never Used  . Alcohol Use: No  . Drug Use: No  . Sexually Active: Not on file   Other Topics Concern  . Not on file   Social History Narrative  . No narrative on file   Family History  Problem Relation Age of Onset  . Heart disease Father   . Heart disease Mother   . Allergies      whole family  . Bronchitis Mother     Review of Systems  Constitutional: Negative for appetite change and unexpected weight change.  HENT: Positive for congestion and sneezing. Negative for sore throat, trouble swallowing and dental problem.   Respiratory: Negative for cough and shortness of breath.   Cardiovascular: Positive for palpitations. Negative for chest pain and leg swelling.  Gastrointestinal: Negative for abdominal pain.  Musculoskeletal: Negative for joint swelling.  Skin: Negative for rash.  Neurological: Negative for headaches.  Psychiatric/Behavioral: Negative for dysphoric mood. The patient is not nervous/anxious.        Objective:   Physical Exam  Gen. Pleasant, obese, in no distress, normal affect ENT - no lesions, no post nasal drip, class 2-3 airway Neck: No JVD, no thyromegaly, no carotid bruits Lungs: no use of accessory muscles, no dullness to percussion, decreased without rales or rhonchi  Cardiovascular: Rhythm regular, heart sounds  normal, no murmurs or gallops, no peripheral edema Abdomen: soft and non-tender, no hepatosplenomegaly, BS normal. Musculoskeletal: No deformities, no cyanosis or clubbing Neuro:  alert, non focal, no tremors        Assessment & Plan:

## 2012-07-11 MED ORDER — CHLORTHALIDONE 25 MG PO TABS: 12.5000 mg | ORAL_TABLET | Freq: Every day | ORAL | Status: DC

## 2012-07-11 NOTE — Progress Notes (Signed)
Spoke to pt WIFE  to advise results/instructions. Pt WIFE understood. New Rx sent to [facility] .Patient orders and reminders have been placed in the patients chart to be released for future reference  Your physician recommends that you have follow up lab work, we will mail you a reminder letter to alert you when to go [employer], located across the street from our office.

## 2012-07-15 ENCOUNTER — Ambulatory Visit (HOSPITAL_BASED_OUTPATIENT_CLINIC_OR_DEPARTMENT_OTHER): Payer: Medicare Other | Attending: Pulmonary Disease | Admitting: Radiology

## 2012-07-15 VITALS — Ht 73.0 in | Wt 226.0 lb

## 2012-07-15 DIAGNOSIS — E119 Type 2 diabetes mellitus without complications: Secondary | ICD-10-CM | POA: Insufficient documentation

## 2012-07-15 DIAGNOSIS — R0989 Other specified symptoms and signs involving the circulatory and respiratory systems: Secondary | ICD-10-CM | POA: Insufficient documentation

## 2012-07-15 DIAGNOSIS — I10 Essential (primary) hypertension: Secondary | ICD-10-CM | POA: Insufficient documentation

## 2012-07-15 DIAGNOSIS — R0609 Other forms of dyspnea: Secondary | ICD-10-CM | POA: Insufficient documentation

## 2012-07-15 DIAGNOSIS — I4891 Unspecified atrial fibrillation: Secondary | ICD-10-CM | POA: Insufficient documentation

## 2012-07-15 DIAGNOSIS — G4733 Obstructive sleep apnea (adult) (pediatric): Secondary | ICD-10-CM

## 2012-07-16 ENCOUNTER — Other Ambulatory Visit: Payer: Self-pay | Admitting: Cardiology

## 2012-07-16 NOTE — Telephone Encounter (Signed)
Please refill.

## 2012-07-22 DIAGNOSIS — G4710 Hypersomnia, unspecified: Secondary | ICD-10-CM

## 2012-07-22 DIAGNOSIS — G4730 Sleep apnea, unspecified: Secondary | ICD-10-CM

## 2012-07-23 ENCOUNTER — Telehealth: Payer: Self-pay | Admitting: Pulmonary Disease

## 2012-07-23 DIAGNOSIS — G4733 Obstructive sleep apnea (adult) (pediatric): Secondary | ICD-10-CM

## 2012-07-23 NOTE — Telephone Encounter (Signed)
He did not sleep well - on 2-3 hrs of sleep But does have moderate obstructive sleep apnea - stopped breathing 20 times/h Will need CPAP - ok to proceed with autoCPAP 5-15 med FF mask if willing, download in 4 wks or arrnage oV to discuss

## 2012-07-23 NOTE — Procedures (Signed)
NAMELORA, GLOMSKI NO.:  [id number]  MEDICAL RECORD NO.:  [id number]          PATIENT TYPE:  OUT  LOCATION:  SLEEP CENTER                 FACILITY:  [facility]  PHYSICIAN:  Oretha Milch, MD      DATE OF BIRTH:  1945-06-26  DATE OF STUDY:  07/22/2012                           NOCTURNAL POLYSOMNOGRAM  REFERRING PHYSICIAN:  Oretha Milch, MD  INDICATION FOR THE STUDY:  Mr. Rossman is a 67 year old gentleman with diabetes, hypertension, atrial fibrillation, loud snoring, and witnessed apneas.  At the time of this study, he weighed 226 pounds with a height of 6 feet 1 inch, BMI of 30, neck size of 17 inches,  Epworth sleepiness score of 10.  This nocturnal polysomnogram was performed with sleep technologist in attendance.  EEG, EOG, EMG, EKG, and respiratory parameters were recorded.  Sleep stages, arousals, limb movements, and respiratory data were scored according to criteria laid out by the American Academy of Sleep Medicine.  SLEEP ARCHITECTURE:  Lights out was at 10:49 p.m.  Lights on was at 4:55 a.m.  Total sleep time was 128 minutes with a sleep period time of 232 minutes and a sleep efficiency of 35%.  Sleep latency was 94 minutes and latency to REM sleep was 130 minutes.  Sleep stages of the percentage of total sleep time was N1 18%, N2 56%, N3 0%, and REM sleep 26% (33 minutes). Supine sleep accounted for 1.5 minutes.  Longest period of REM sleep was around 3 a.m.  AROUSAL DATA:  There were 56 arousals with an arousal index of 26 events per hour.  Of these, 47 were spontaneous and the rest were associated with respiratory events.  RESPIRATORY DATA:  There were total of 9 obstructive apneas, 0 central apnea, 0 mixed apneas, and 39 hypopneas with an apnea-hypopnea index of 23 events per hour.  Most of these events were during REM sleep.  The RDI was 37 events per hour with the longest apnea of 31 seconds and longest hypopnea was 60 seconds.  OXYGEN  SATURATION DATA:  The desaturation index was 41 events per hour. The lowest desaturation was 78%.  He spent 26 minutes with saturation less than 88%.  CARDIAC DATA:  The low heart rate was 33 beats per minute.  The high heart rate was 122 beats per minute.  No arrhythmias.  Atrial fibrillation were noted.  DISCUSSION:  He was desensitized with a medium full-face mask, but did not meet split night criteria.  IMPRESSION: 1. Moderate obstructive sleep apnea with hypopneas predominantly     during REM sleep causing moderate oxygen desaturation and sleep     fragmentation. 2. No evidence of cardiac arrhythmias, limb movements, or behavioral     disturbance during sleep.  RECOMMENDATIONS: 1. Treatment options at this degree of sleep disorder breathing     include dental appliance or CPAP therapy. 2. He should be asked to avoid medications with sedative side effects. 3. Should be cautioned against driving when sleepy.     Oretha Milch, MD    RVA/MEDQ  D:  07/22/2012 12:54:15  T:  07/23/2012 02:35:19  Job:  [id number]

## 2012-07-25 NOTE — Telephone Encounter (Signed)
I spoke with patient about results and he verbalized understanding and had no questions. cpap ordered. Will forward to [facility] as an FYI I placed order

## 2012-07-31 ENCOUNTER — Other Ambulatory Visit: Payer: Self-pay | Admitting: *Deleted

## 2012-07-31 DIAGNOSIS — I10 Essential (primary) hypertension: Secondary | ICD-10-CM

## 2012-08-05 ENCOUNTER — Ambulatory Visit (INDEPENDENT_AMBULATORY_CARE_PROVIDER_SITE_OTHER): Payer: Medicare Other | Admitting: *Deleted

## 2012-08-05 ENCOUNTER — Encounter: Payer: Self-pay | Admitting: *Deleted

## 2012-08-05 DIAGNOSIS — I480 Paroxysmal atrial fibrillation: Secondary | ICD-10-CM

## 2012-08-05 DIAGNOSIS — I4891 Unspecified atrial fibrillation: Secondary | ICD-10-CM

## 2012-08-05 DIAGNOSIS — Z7901 Long term (current) use of anticoagulants: Secondary | ICD-10-CM

## 2012-08-05 LAB — BASIC METABOLIC PANEL
Glucose, Bld: 111 mg/dL — ABNORMAL HIGH (ref 70–99)
Potassium: 3.9 mEq/L (ref 3.5–5.3)
Sodium: 142 mEq/L (ref 135–145)

## 2012-08-05 LAB — POCT INR: INR: 2.2

## 2012-09-19 ENCOUNTER — Encounter: Payer: Self-pay | Admitting: Pulmonary Disease

## 2012-09-19 ENCOUNTER — Telehealth: Payer: Self-pay | Admitting: Pulmonary Disease

## 2012-09-19 ENCOUNTER — Ambulatory Visit (INDEPENDENT_AMBULATORY_CARE_PROVIDER_SITE_OTHER): Payer: Medicare Other | Admitting: Pulmonary Disease

## 2012-09-19 VITALS — BP 130/72 | HR 54 | Temp 98.6°F | Ht 73.0 in | Wt 225.4 lb

## 2012-09-19 DIAGNOSIS — G4733 Obstructive sleep apnea (adult) (pediatric): Secondary | ICD-10-CM

## 2012-09-19 NOTE — Telephone Encounter (Signed)
Will forward to Dr. Vassie Loll so he can let me know once note is done so I can fax this over. Please advise Dr. Vassie Loll thanks

## 2012-09-19 NOTE — Assessment & Plan Note (Signed)
You have moderate OSA corrected by CPAP 11 cm Use of at least 4-6h every night is expected Decrease humidity setting  Weight loss encouraged, compliance with goal of at least 4-6 hrs every night is the expectation. Advised against medications with sedative side effects Cautioned against driving when sleepy - understanding that sleepiness will vary on a day to day basis

## 2012-09-19 NOTE — Progress Notes (Signed)
  Subjective:    Patient ID: Riley [name], male    DOB: [birth year], 67 y.o.   MRN: [id number]  HPI 67 year old never smoker with atrial fibrillation for FU of obstructive sleep apnea.  He had an admission for atrial fibrillation and was observed to have apneas and snoring. Wife also reports loud snoring. She is a light sleeper and is moved to different bedroom for the past few years. He reports dryness of mouth and waking up in the morning with palpitations. He is maintained on a regimen of Acebutalol and Cardizem as needed for palpitations to avoid ER visits. Takes Nasonex for nasal stuffiness.  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   09/19/2012  PSG >>He did not sleep well- TST 128 mins - only 2-3 hrs of sleep  But does have moderate obstructive sleep apnea -RDI 37/h- stopped breathing 20 times/h  Started on autoCPAP 5-15 med FF mask   Pt reports he wearing his CPAP everynight. Pt denies any problems w/ macine/mask. Pt reports when he gets up in the morning he has a lot of draiage in his nose for about 1 hr. pt reports he feels rested during the day  Bedtime is 11:30 to midnight, sleep latency is up to an hour, he sleeps on her side with one pillow, has 2-3 awakenings for nocturia and is out of bed at 7:30 AM with an occasional sinus headache and dryness of mouth.  Download 08/28/12 >> AHI 6/h, usage 5h, avg pr 11 cm  Review of Systems neg for any significant sore throat, dysphagia, itching, sneezing, nasal congestion or excess/ purulent secretions, fever, chills, sweats, unintended wt loss, pleuritic or exertional cp, hempoptysis, orthopnea pnd or change in chronic leg swelling. Also denies presyncope, palpitations, heartburn, abdominal pain, nausea, vomiting, diarrhea or change in bowel or urinary habits, dysuria,hematuria, rash, arthralgias, visual complaints, headache, numbness weakness or ataxia.     Objective:   Physical Exam  Gen. Pleasant, well-nourished, in  no distress ENT - no lesions, no post nasal drip Neck: No JVD, no thyromegaly, no carotid bruits Lungs: no use of accessory muscles, no dullness to percussion, clear without rales or rhonchi  Cardiovascular: Rhythm regular, heart sounds  normal, no murmurs or gallops, no peripheral edema Musculoskeletal: No deformities, no cyanosis or clubbing        Assessment & Plan:

## 2012-09-19 NOTE — Patient Instructions (Addendum)
You have moderate OSA corrected by CPAP 11 cm Use of at least 4-6h every night is expected Decrease humidity setting

## 2012-09-23 ENCOUNTER — Ambulatory Visit (INDEPENDENT_AMBULATORY_CARE_PROVIDER_SITE_OTHER): Payer: Medicare Other | Admitting: Cardiology

## 2012-09-23 ENCOUNTER — Encounter: Payer: Self-pay | Admitting: Cardiology

## 2012-09-23 ENCOUNTER — Ambulatory Visit (INDEPENDENT_AMBULATORY_CARE_PROVIDER_SITE_OTHER): Payer: Medicare Other | Admitting: *Deleted

## 2012-09-23 VITALS — BP 134/76 | HR 45 | Ht 73.0 in | Wt 225.0 lb

## 2012-09-23 DIAGNOSIS — I4891 Unspecified atrial fibrillation: Secondary | ICD-10-CM

## 2012-09-23 DIAGNOSIS — R609 Edema, unspecified: Secondary | ICD-10-CM

## 2012-09-23 DIAGNOSIS — I480 Paroxysmal atrial fibrillation: Secondary | ICD-10-CM

## 2012-09-23 DIAGNOSIS — I10 Essential (primary) hypertension: Secondary | ICD-10-CM

## 2012-09-23 DIAGNOSIS — E119 Type 2 diabetes mellitus without complications: Secondary | ICD-10-CM

## 2012-09-23 DIAGNOSIS — G4733 Obstructive sleep apnea (adult) (pediatric): Secondary | ICD-10-CM

## 2012-09-23 DIAGNOSIS — Z7901 Long term (current) use of anticoagulants: Secondary | ICD-10-CM

## 2012-09-23 DIAGNOSIS — E785 Hyperlipidemia, unspecified: Secondary | ICD-10-CM

## 2012-09-23 LAB — POCT INR: INR: 2.1

## 2012-09-23 NOTE — Assessment & Plan Note (Signed)
Markedly improved with CPAP, possibly also extending to improved atrial fibrillation, hypertension and fatigue.

## 2012-09-23 NOTE — Assessment & Plan Note (Addendum)
Mild diabetes well controlled with oral agents.

## 2012-09-23 NOTE — Assessment & Plan Note (Signed)
Blood pressure control now excellent. Patient will continue to monitor and report elevated values.

## 2012-09-23 NOTE — Patient Instructions (Addendum)
Your physician recommends that you schedule a follow-up appointment in: 8 MONTHS Dr. Purvis Sheffield   Your Physician recommends that you complete the Hemoccult package given to you today, instructions are enclosed in your packet. Once completed please return to this office.  Your physician has recommended that you wear an event monitor. Event monitors are medical devices that record the heart's electrical activity. Doctors most often [place] these monitors to diagnose arrhythmias. Arrhythmias are problems with the speed or rhythm of the heartbeat. The monitor is a small, portable device. You can wear one while you do your normal daily activities. This is usually used to diagnose what is causing palpitations/syncope (passing out).3 WEEKS WE WILL CALL YOU WITH THE RESULTS  PLEASE CALL YOUR PCP TO DISCUSS NEED FOR COLONOSCOPY PER NOTED NO RECORDS ON FILE FOR THIS PROCEDURE SINCE 2011

## 2012-09-23 NOTE — Assessment & Plan Note (Addendum)
Low to moderate risk for thromboembolism. If recurrent atrial fibrillation cannot be documented with one month of event recording, chronic anticoagulation will be discontinued.

## 2012-09-23 NOTE — Progress Notes (Deleted)
Name: Riley [name]    DOB: 07/11/1945  Age: 67 y.o.  MR#: [id number]       PCP:  Cassell Smiles., MD      Insurance: Payor: Advertising copywriter MEDICARE / Plan: AARP MEDICARE COMPLETE / Product Type: *No Product type* /   CC:   No chief complaint on file.  NO LIST  VS Filed Vitals:   09/23/12 1412  BP: 134/76  Pulse: 45  Height: 6\' 1"  (1.854 m)  Weight: 225 lb (102.059 kg)    Weights Current Weight  09/23/12 225 lb (102.059 kg)  09/19/12 225 lb 6.4 oz (102.241 kg)  07/15/12 226 lb (102.513 kg)    Blood Pressure  BP Readings from Last 3 Encounters:  09/23/12 134/76  09/19/12 130/72  06/26/12 140/80     Admit date:  (Not on file) Last encounter with RMR:  07/16/2012   Allergy Review of patient's allergies indicates no known allergies.  Current Outpatient Prescriptions  Medication Sig Dispense Refill  . acebutolol (SECTRAL) 200 MG capsule Take 1 capsule (200 mg total) by mouth daily.  30 capsule  6  . chlorthalidone (HYGROTON) 25 MG tablet Take 0.5 tablets (12.5 mg total) by mouth daily.  90 tablet  3  . COUMADIN 5 MG tablet TAKE AS DIRECTED BY COUMADIN CLINIC.  60 tablet  3  . diltiazem (CARDIZEM) 30 MG tablet Only as needed for tachycardia (palpitations)  30 tablet  3  . doxazosin (CARDURA) 4 MG tablet 3 tabs DAILY      . lisinopril (PRINIVIL,ZESTRIL) 40 MG tablet TAKE ONE TABLET DAILY.  30 tablet  6  . ranitidine (ZANTAC) 150 MG tablet Take 150 mg by mouth 2 (two) times daily.        . sitaGLIPtan-metformin (JANUMET) 50-1000 MG per tablet Take 1 tablet by mouth 2 (two) times daily with a meal.      . verapamil (VERELAN PM) 360 MG 24 hr capsule Take 1 capsule (360 mg total) by mouth at bedtime.  30 capsule  6  . pravastatin (PRAVACHOL) 40 MG tablet Take 1 tablet (40 mg total) by mouth daily.  30 tablet  12   No current facility-administered medications for this visit.    Discontinued Meds:   There are no discontinued medications.  Patient Active Problem List   Diagnosis Date Noted  . OSA (obstructive sleep apnea) 06/26/2012  . Seasonal allergies 04/29/2012  . Paroxysmal atrial fibrillation   . Chronic anticoagulation   . Hypertension   . Diabetes mellitus, type 2   . Hyperlipidemia   . Edema 06/08/2009    LABS    Component Value Date/Time   NA 142 08/05/2012 1304   NA 137 05/15/2012 0721   NA 136 04/30/2012 0237   K 3.9 08/05/2012 1304   K 3.4* 05/15/2012 0721   K 3.7 04/30/2012 0237   CL 103 08/05/2012 1304   CL 100 05/15/2012 0721   CL 100 04/30/2012 0237   CO2 28 08/05/2012 1304   CO2 22 05/15/2012 0721   CO2 27 04/30/2012 0237   GLUCOSE 111* 08/05/2012 1304   GLUCOSE 119* 05/15/2012 0721   GLUCOSE 126* 04/30/2012 0237   BUN 27* 08/05/2012 1304   BUN 19 05/15/2012 0721   BUN 16 04/30/2012 0237   CREATININE 1.13 08/05/2012 1304   CREATININE 1.08 05/15/2012 0721   CREATININE 1.04 04/30/2012 0237   CREATININE 1.13 04/29/2012 1022   CALCIUM 9.5 08/05/2012 1304   CALCIUM 9.7 05/15/2012 0721   CALCIUM  9.1 04/30/2012 0237   GFRNONAA 70* 05/15/2012 0721   GFRNONAA 73* 04/30/2012 0237   GFRNONAA 66* 04/29/2012 1022   GFRAA 81* 05/15/2012 0721   GFRAA 85* 04/30/2012 0237   GFRAA 77* 04/29/2012 1022   CMP     Component Value Date/Time   NA 142 08/05/2012 1304   K 3.9 08/05/2012 1304   CL 103 08/05/2012 1304   CO2 28 08/05/2012 1304   GLUCOSE 111* 08/05/2012 1304   BUN 27* 08/05/2012 1304   CREATININE 1.13 08/05/2012 1304   CREATININE 1.08 05/15/2012 0721   CALCIUM 9.5 08/05/2012 1304   PROT 6.3 06/21/2009 1858   ALBUMIN 4.4 06/21/2009 1858   AST 14 06/21/2009 1858   ALT 19 06/21/2009 1858   ALKPHOS 41 06/21/2009 1858   BILITOT 0.7 06/21/2009 1858   GFRNONAA 70* 05/15/2012 0721   GFRAA 81* 05/15/2012 0721       Component Value Date/Time   WBC 7.4 05/15/2012 0721   WBC 9.2 04/30/2012 0237   WBC 6.5 04/29/2012 1022   HGB 15.4 05/15/2012 0721   HGB 15.7 04/30/2012 0237   HGB 16.1 04/29/2012 1022   HCT 45.0 05/15/2012 0721   HCT 45.7 04/30/2012 0237   HCT 47.1 04/29/2012 1022    MCV 87.9 05/15/2012 0721   MCV 87.5 04/30/2012 0237   MCV 87.5 04/29/2012 1022    Lipid Panel     Component Value Date/Time   CHOL 98 06/21/2009 1858   TRIG 93 06/21/2009 1858   HDL 37* 06/21/2009 1858   CHOLHDL 2.6 Ratio 06/21/2009 1858   VLDL 19 06/21/2009 1858   LDLCALC 42 06/21/2009 1858   LDLCALC 48 07/30/2006    ABG No results found for this basename: phart, pco2, pco2art, po2, po2art, hco3, tco2, acidbasedef, o2sat     Lab Results  Component Value Date   TSH 1.120 04/29/2012   BNP (last 3 results)  Recent Labs  04/29/12 1022  PROBNP 262.9*   Cardiac Panel (last 3 results) No results found for this basename: CKTOTAL, CKMB, TROPONINI, RELINDX,  in the last 72 hours  Iron/TIBC/Ferritin No results found for this basename: iron, tibc, ferritin     EKG Orders placed in visit on 06/12/12  . EKG 12-LEAD     Prior Assessment and Plan Problem List as of 09/23/2012     Cardiovascular and Mediastinum   Paroxysmal atrial fibrillation   Last Assessment & Plan   06/04/2012 Office Visit Written 06/04/2012  4:45 PM by Kathlen Brunswick, MD     Patient continues to be symptomatic with episodes of PAF. Acebutolol will be added at low dose in an attempt to control ventricular response in AF but to avoid sinus bradycardia.    Hypertension   Last Assessment & Plan   06/04/2012 Office Visit Written 06/04/2012  4:44 PM by Kathlen Brunswick, MD     Blood pressure control appears to be fairly good. Acebutolol has been added primarily to slow ventricular response in atrial fibrillation in his attempt to relieve palpitations, but could also contribute to blood pressure control. Blood pressure check to be performed in 2 weeks. Patient will monitor home values in the interim.      Respiratory   Seasonal allergies   OSA (obstructive sleep apnea)   Last Assessment & Plan   09/19/2012 Office Visit Written 09/19/2012  2:41 PM by Oretha Milch, MD     You have moderate OSA corrected by CPAP 11 cm Use of  at least  4-6h every night is expected Decrease humidity setting  Weight loss encouraged, compliance with goal of at least 4-6 hrs every night is the expectation. Advised against medications with sedative side effects Cautioned against driving when sleepy - understanding that sleepiness will vary on a day to day basis       Endocrine   Diabetes mellitus, type 2     Other   Edema   Last Assessment & Plan   06/04/2012 Office Visit Written 06/04/2012  4:43 PM by Kathlen Brunswick, MD     Adequately controlled with current medication. Electrolytes and renal function will be periodically monitored.    Chronic anticoagulation   Last Assessment & Plan   06/04/2012 Office Visit Written 06/04/2012  4:43 PM by Kathlen Brunswick, MD     INR has been stable and therapeutic. We will continue to monitor, perform serial testing to exclude occult GI blood loss and to adjust warfarin dosage as necessary.    Hyperlipidemia   Last Assessment & Plan   06/04/2012 Office Visit Edited 06/09/2012 10:45 AM by Kathlen Brunswick, MD     Control of hyperlipidemia was excellent one year ago. We will seek the results of a more recent lipid profile.        Imaging: No results found.

## 2012-09-23 NOTE — Assessment & Plan Note (Signed)
Lipids well controlled with current medication when last assessed in 2013.

## 2012-09-23 NOTE — Progress Notes (Signed)
Patient ID: Riley [name], male   DOB: [birth year], 67 y.o.   MRN: [id number]  HPI: Scheduled return visit for this very nice gentleman with paroxysmal atrial fibrillation and hypertension. Since last visit, he has done quite well. Palpitations are minimal and brief. Exercise tolerance is good. He said no apparent adverse effects related to anticoagulation. He has been diagnosed with sleep apnea and treated with CPAP with excellent results. Fatigue, palpitations and blood pressure have decreased, although he attributes some of this to discontinuing caffeine-containing beverages.  Current Outpatient Prescriptions  Medication Sig Dispense Refill  . acebutolol (SECTRAL) 200 MG capsule Take 1 capsule (200 mg total) by mouth daily.  30 capsule  6  . chlorthalidone (HYGROTON) 25 MG tablet Take 0.5 tablets (12.5 mg total) by mouth daily.  90 tablet  3  . COUMADIN 5 MG tablet TAKE AS DIRECTED BY COUMADIN CLINIC.  60 tablet  3  . diltiazem (CARDIZEM) 30 MG tablet Only as needed for tachycardia (palpitations)  30 tablet  3  . doxazosin (CARDURA) 4 MG tablet 3 tabs DAILY      . lisinopril (PRINIVIL,ZESTRIL) 40 MG tablet TAKE ONE TABLET DAILY.  30 tablet  6  . ranitidine (ZANTAC) 150 MG tablet Take 150 mg by mouth 2 (two) times daily.        . sitaGLIPtan-metformin (JANUMET) 50-1000 MG per tablet Take 1 tablet by mouth 2 (two) times daily with a meal.      . verapamil (VERELAN PM) 360 MG 24 hr capsule Take 1 capsule (360 mg total) by mouth at bedtime.  30 capsule  6  . pravastatin (PRAVACHOL) 40 MG tablet Take 1 tablet (40 mg total) by mouth daily.  30 tablet  12   No current facility-administered medications for this visit.   No Known Allergies   Past medical history, social history, and family history reviewed and updated.  ROS: General: no anorexia; 12 pound intentional weight gain in recent months Cardiac: no chest pain, dyspnea, orthopnea, PND, or syncope Respiratory: no cough, sputum production or  hemoptysis GI: no nausea, abdominal pain, emesis, diarrhea or constipation Integument: no significant lesions Neurologic: No muscle weakness or paralysis; no speech disturbance; no headache  PHYSICAL EXAM: BP 134/76  Pulse 45  Ht 6\' 1"  (1.854 m)  Wt 102.059 kg (225 lb)  BMI 29.69 kg/m2;  Body mass index is 29.69 kg/(m^2). General-Well developed; no acute distress Body habitus-proportionate weight and height Neck-No JVD; no carotid bruits Lungs-clear lung fields; resonant to percussion Cardiovascular-normal PMI; normal S1 and S2; + S4; regular rhythm Abdomen-normal bowel sounds; soft and non-tender without masses or organomegaly Musculoskeletal-No deformities, no cyanosis or clubbing Neurologic-Normal cranial nerves; symmetric strength and tone Skin-Warm, no significant lesions; increased pigmentation in the lower extremities representing chronic stasis Extremities-distal pulses intact; edema: Trace on the right; none on left.  Rhythm strip: Sinus bradycardia at a rate of 52 bpm with PACs; IVCD  Dillingham Bing, MD 09/23/2012  4:56 PM  ASSESSMENT AND PLAN

## 2012-09-23 NOTE — Assessment & Plan Note (Signed)
Essentially resolved with modification of medical regime.

## 2012-09-23 NOTE — Assessment & Plan Note (Signed)
Patient is doing well clinically with warfarin. New samples for FOBT requested. Patient will contact PCP to discuss advisability of screening colonoscopy.

## 2012-09-30 NOTE — Telephone Encounter (Signed)
I have faxed this over to [facility]. Nothing further wasnneeded

## 2012-09-30 NOTE — Telephone Encounter (Signed)
Note has been completed. Carron Curie, CMA

## 2012-10-02 ENCOUNTER — Ambulatory Visit: Payer: Medicare Other | Admitting: Cardiology

## 2012-10-28 ENCOUNTER — Other Ambulatory Visit: Payer: Self-pay | Admitting: *Deleted

## 2012-10-28 DIAGNOSIS — I4891 Unspecified atrial fibrillation: Secondary | ICD-10-CM

## 2012-10-30 ENCOUNTER — Telehealth: Payer: Self-pay | Admitting: Pulmonary Disease

## 2012-10-30 NOTE — Telephone Encounter (Signed)
Download 10/24/12  -CPAP 11 cm iseffective Good usage AHI 6/h- ok

## 2012-10-31 ENCOUNTER — Telehealth: Payer: Self-pay | Admitting: Cardiology

## 2012-10-31 NOTE — Telephone Encounter (Signed)
Advised pt wife of monitor results, pt wife advised that Dr Macarthur Critchley discussed taking the pt off his coumadin at his last OV notation from Dr RR at last OV as the following:  Paroxysmal atrial fibrillation - Kathlen Brunswick, MD at 10/02/2012 11:44 AM   Status: Linus Orn Related Problem: Paroxysmal atrial fibrillation        Low to moderate risk for thromboembolism. If recurrent atrial fibrillation cannot be documented with one month of event recording, chronic anticoagulation will be discontinued.   However pt was only on the monitor for 21 days instead of one full, and pt wife noted no a-fib recently and the c-pap machine made him feel so much better, please advise

## 2012-10-31 NOTE — Telephone Encounter (Signed)
RESULTS OF MONITOR / WANTS TO KNOW IF HE NEEDS TO CONTINUE TO TAKING COUMADIN / TGS

## 2012-11-01 NOTE — Telephone Encounter (Signed)
.  left message to have patient return my call.  

## 2012-11-01 NOTE — Telephone Encounter (Signed)
lmomtcb x1 

## 2012-11-01 NOTE — Telephone Encounter (Signed)
Pt returning call

## 2012-11-01 NOTE — Telephone Encounter (Signed)
lmomtcb x1 for pt 

## 2012-11-01 NOTE — Telephone Encounter (Signed)
With a known h/o parox A Fib, his CHADS2 score is 2, based on HTN and diabetes. For this reason, if anticoagulation is tolerated, it is indicated to prevent stroke. If he decides to stop anticoagulation, that risk is increased. Ultimately, the patient's decision.

## 2012-11-05 NOTE — Telephone Encounter (Signed)
I spoke with patient about results and he verbalized understanding and had no questions 

## 2012-11-05 NOTE — Telephone Encounter (Signed)
GNF:AOZHY to pt WIFE,to advise results/instructions. Pt WIFE understood and advised the pt will think about the decision and will let lisa know at his coumadin check tomorrow

## 2012-11-06 ENCOUNTER — Ambulatory Visit (INDEPENDENT_AMBULATORY_CARE_PROVIDER_SITE_OTHER): Payer: Medicare Other | Admitting: *Deleted

## 2012-11-06 DIAGNOSIS — I480 Paroxysmal atrial fibrillation: Secondary | ICD-10-CM

## 2012-11-06 DIAGNOSIS — I4891 Unspecified atrial fibrillation: Secondary | ICD-10-CM

## 2012-11-06 DIAGNOSIS — Z7901 Long term (current) use of anticoagulants: Secondary | ICD-10-CM

## 2012-11-06 LAB — POCT INR: INR: 1.7

## 2012-11-20 ENCOUNTER — Ambulatory Visit (INDEPENDENT_AMBULATORY_CARE_PROVIDER_SITE_OTHER): Payer: Medicare Other | Admitting: Gastroenterology

## 2012-11-20 ENCOUNTER — Encounter: Payer: Self-pay | Admitting: Gastroenterology

## 2012-11-20 VITALS — BP 152/79 | HR 51 | Temp 97.8°F | Ht 73.0 in | Wt 223.6 lb

## 2012-11-20 DIAGNOSIS — K649 Unspecified hemorrhoids: Secondary | ICD-10-CM | POA: Insufficient documentation

## 2012-11-20 DIAGNOSIS — K5900 Constipation, unspecified: Secondary | ICD-10-CM

## 2012-11-20 DIAGNOSIS — Z7901 Long term (current) use of anticoagulants: Secondary | ICD-10-CM

## 2012-11-20 MED ORDER — POLYETHYLENE GLYCOL 3350 17 GM/SCOOP PO POWD: 17.0000 g | Freq: Every day | ORAL | Status: DC

## 2012-11-20 MED ORDER — PEG-KCL-NACL-NASULF-NA ASC-C 100 G PO SOLR: 1.0000 | ORAL | Status: DC

## 2012-11-20 NOTE — Patient Instructions (Addendum)
1. Start MiraLax 1 cap full daily for constipation. You can buy over-the-counter. 2. Colonoscopy as planned. Please see separate instructions.

## 2012-11-20 NOTE — Progress Notes (Signed)
Primary Care Physician:  Cassell Smiles., MD  Primary Gastroenterologist:  Roetta Sessions, MD   Chief Complaint  Patient presents with  . Constipation  . Colonoscopy  . Hemorrhoids    HPI:  MASUD HOLUB is a 67 y.o. male here at the request of Dr. Sherwood Gambler for further evaluation of constipation, hemorrhoids. Patient states he has to strain a lot to have a bowel movement. This pushes his hemorrhoids out. Prep H right now. Trying colace, Gummie Fiber, Colon cleanser. Colon cleanser helps a little. Eats a lot of vegetables. Drinks half a gallon of water per day. Denies rectal bleeding or melena. In morning, aching in lower back/low abdomen and goes up to epigastric. Umbilical hernia. Diastasis recti. Lost from 230lb to 223lb intentionally. Cuts back on portion size and feels better. No dysphagia. No heartburn.  BM every 2-3 days. No prior colonoscopy.  On Coumadin for A.fib. No prior CVA. Reports he was recently given the option to stop Coumadin given the infrequent A. fib. He opted to continue the medication.  Current Outpatient Prescriptions  Medication Sig Dispense Refill  . acebutolol (SECTRAL) 200 MG capsule Take 1 capsule (200 mg total) by mouth daily.  30 capsule  6  . chlorthalidone (HYGROTON) 25 MG tablet Take 0.5 tablets (12.5 mg total) by mouth daily.  90 tablet  3  . COUMADIN 5 MG tablet TAKE AS DIRECTED BY COUMADIN CLINIC.  60 tablet  3  . diltiazem (CARDIZEM) 30 MG tablet Only as needed for tachycardia (palpitations)  30 tablet  3  . docusate sodium (COLACE) 100 MG capsule Take 100 mg by mouth 2 (two) times daily.      Marland Kitchen doxazosin (CARDURA) 4 MG tablet 3 tabs DAILY      . lisinopril (PRINIVIL,ZESTRIL) 40 MG tablet TAKE ONE TABLET DAILY.  30 tablet  6  . pravastatin (PRAVACHOL) 40 MG tablet Take 40 mg by mouth daily.      . ranitidine (ZANTAC) 150 MG tablet Take 150 mg by mouth 2 (two) times daily.        . sitaGLIPtan-metformin (JANUMET) 50-1000 MG per tablet Take 1 tablet by mouth  2 (two) times daily with a meal.      . verapamil (VERELAN PM) 360 MG 24 hr capsule Take 1 capsule (360 mg total) by mouth at bedtime.  30 capsule  6   No current facility-administered medications for this visit.    Allergies as of 11/20/2012  . (No Known Allergies)    Past Medical History  Diagnosis Date  . Paroxysmal atrial fibrillation     12/2005; sinus bradycardia secondary to medications  . Chronic anticoagulation   . Hypertension     normal coronary angiography and normal EF in 12/98  . Diabetes mellitus, type 2     no insulin  . Chronic back pain   . Nodule of left lung     Granuloma in left upper lobe  . Edema   . Hyperlipidemia   . Sleep apnea     Past Surgical History  Procedure Laterality Date  . Appendectomy  1998  . Tonsillectomy      as a child  . Colonoscopy  Never    Family History  Problem Relation Age of Onset  . Heart disease Father   . Heart disease Mother   . Allergies      whole family  . Bronchitis Mother   . Colon cancer Neg Hx     History   Social History  .  Marital Status: Married    Spouse Name: N/A    Number of Children: N/A  . Years of Education: N/A   Occupational History  . 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 Smoking status: Never Smoker   . Smokeless tobacco: Never Used  . Alcohol Use: No  . Drug Use: No  . Sexual Activity: Not on file   Other Topics Concern  . Not on file   Social History Narrative  . No narrative on file      ROS:  General: Negative for anorexia, unintentional weight loss, fever, chills, fatigue, weakness. Eyes: Negative for vision changes.  ENT: Negative for hoarseness, difficulty swallowing , nasal congestion. CV: Negative for chest pain, angina, palpitations, dyspnea on exertion, peripheral edema.  Respiratory: Negative for dyspnea at rest, dyspnea on exertion, cough, sputum, wheezing.  GI: See history of present illness. GU:  Negative for  dysuria, hematuria, urinary incontinence, urinary frequency, nocturnal urination.  MS: Negative for joint pain, low back pain. See history of present illness Derm: Negative for rash or itching.  Neuro: Negative for weakness, abnormal sensation, seizure, frequent headaches, memory loss, confusion.  Psych: Negative for anxiety, depression, suicidal ideation, hallucinations.  Endo: Negative for unusual weight change.  Heme: Negative for bruising or bleeding. Allergy: Negative for rash or hives.    Physical Examination:  BP 152/79  Pulse 51  Temp(Src) 97.8 F (36.6 C) (Oral)  Ht 6\' 1"  (1.854 m)  Wt 223 lb 9.6 oz (101.424 kg)  BMI 29.51 kg/m2   General: Well-nourished, well-developed in no acute distress. Accompanied by wife Head: Normocephalic, atraumatic.   Eyes: Conjunctiva pink, no icterus. Mouth: Oropharyngeal mucosa moist and pink , no lesions erythema or exudate. Neck: Supple without thyromegaly, masses, or lymphadenopathy.  Lungs: Clear to auscultation bilaterally.  Heart: Regular rate and rhythm, no murmurs rubs or gallops.  Abdomen: Bowel sounds are normal, nontender, nondistended, no hepatosplenomegaly or masses, no abdominal bruits, no rebound or guarding.  Golfball sized umbilical hernia easily reducible nontender. Positive diastases recti. Rectal: Not performed Extremities: No lower extremity edema. No clubbing or deformities.  Neuro: Alert and oriented x 4 , grossly normal neurologically.  Skin: Warm and dry, no rash or jaundice.   Psych: Alert and cooperative, normal mood and affect.

## 2012-11-21 ENCOUNTER — Encounter: Payer: Self-pay | Admitting: Gastroenterology

## 2012-11-21 NOTE — Assessment & Plan Note (Signed)
67 year old gentleman who presents with complaints of worsening constipation, hemorrhoids and no prior history of colonoscopy. He is on chronic Coumadin therapy given history of atrial fibrillation. Constipation may be secondary to his medications. He maybe a candidate for hemorrhoid banding procedure. Discussed with patient today. Proceed with colonoscopy. We will hold his Coumadin 4 days prior to procedure.  I have discussed the risks, alternatives, benefits with regards to but not limited to the risk of reaction to medication, bleeding, infection, perforation and the patient is agreeable to proceed. Written consent to be obtained.  Further instructions regarding Coumadin to begin after procedure.  Trial of MiraLax 17 g daily. Stop Colace.

## 2012-11-25 ENCOUNTER — Encounter (HOSPITAL_COMMUNITY): Payer: Self-pay | Admitting: Pharmacy Technician

## 2012-11-25 NOTE — Progress Notes (Signed)
CC'd to PCP 

## 2012-11-26 ENCOUNTER — Encounter (HOSPITAL_COMMUNITY): Payer: Self-pay | Admitting: *Deleted

## 2012-11-26 ENCOUNTER — Ambulatory Visit (HOSPITAL_COMMUNITY)
Admission: RE | Admit: 2012-11-26 | Discharge: 2012-11-26 | Disposition: A | Discharge status: Home / Self Care | Payer: Medicare Other | Source: Ambulatory Visit | Attending: Internal Medicine | Admitting: Internal Medicine

## 2012-11-26 ENCOUNTER — Encounter (HOSPITAL_COMMUNITY)
Admission: RE | Disposition: A | Discharge status: Home / Self Care | Payer: Self-pay | Source: Ambulatory Visit | Attending: Internal Medicine

## 2012-11-26 DIAGNOSIS — R198 Other specified symptoms and signs involving the digestive system and abdomen: Secondary | ICD-10-CM

## 2012-11-26 DIAGNOSIS — E119 Type 2 diabetes mellitus without complications: Secondary | ICD-10-CM | POA: Insufficient documentation

## 2012-11-26 DIAGNOSIS — K649 Unspecified hemorrhoids: Secondary | ICD-10-CM

## 2012-11-26 DIAGNOSIS — Z01812 Encounter for preprocedural laboratory examination: Secondary | ICD-10-CM | POA: Insufficient documentation

## 2012-11-26 DIAGNOSIS — K5900 Constipation, unspecified: Secondary | ICD-10-CM

## 2012-11-26 DIAGNOSIS — K5730 Diverticulosis of large intestine without perforation or abscess without bleeding: Secondary | ICD-10-CM

## 2012-11-26 DIAGNOSIS — D126 Benign neoplasm of colon, unspecified: Secondary | ICD-10-CM

## 2012-11-26 DIAGNOSIS — I10 Essential (primary) hypertension: Secondary | ICD-10-CM | POA: Insufficient documentation

## 2012-11-26 DIAGNOSIS — Z7901 Long term (current) use of anticoagulants: Secondary | ICD-10-CM

## 2012-11-26 DIAGNOSIS — K648 Other hemorrhoids: Secondary | ICD-10-CM | POA: Insufficient documentation

## 2012-11-26 HISTORY — PX: COLONOSCOPY: SHX5424

## 2012-11-26 SURGERY — COLONOSCOPY
Anesthesia: Moderate Sedation

## 2012-11-26 MED ORDER — MIDAZOLAM HCL 5 MG/5ML IJ SOLN
INTRAMUSCULAR | Status: AC
  Filled 2012-11-26: qty 10

## 2012-11-26 MED ORDER — ONDANSETRON HCL 4 MG/2ML IJ SOLN
INTRAMUSCULAR | Status: AC
  Filled 2012-11-26: qty 2

## 2012-11-26 MED ORDER — MEPERIDINE HCL 100 MG/ML IJ SOLN
INTRAMUSCULAR | Status: AC
  Filled 2012-11-26: qty 2

## 2012-11-26 MED ORDER — STERILE WATER FOR IRRIGATION IR SOLN
Status: DC | PRN
  Administered 2012-11-26: 10:00:00

## 2012-11-26 MED ORDER — SODIUM CHLORIDE 0.9 % IV SOLN
INTRAVENOUS | Status: DC
  Administered 2012-11-26: 10:00:00 via INTRAVENOUS

## 2012-11-26 MED ORDER — MEPERIDINE HCL 100 MG/ML IJ SOLN
INTRAMUSCULAR | Status: DC | PRN
  Administered 2012-11-26: 25 mg via INTRAVENOUS
  Administered 2012-11-26: 50 mg via INTRAVENOUS

## 2012-11-26 MED ORDER — ONDANSETRON HCL 4 MG/2ML IJ SOLN
INTRAMUSCULAR | Status: DC | PRN
  Administered 2012-11-26: 4 mg via INTRAVENOUS

## 2012-11-26 MED ORDER — MIDAZOLAM HCL 5 MG/5ML IJ SOLN
INTRAMUSCULAR | Status: DC | PRN
  Administered 2012-11-26 (×2): 2 mg via INTRAVENOUS
  Administered 2012-11-26: 1 mg via INTRAVENOUS

## 2012-11-26 NOTE — Op Note (Signed)
[facility] [address] [place], [address]   COLONOSCOPY PROCEDURE REPORT  PATIENT: Riley [name], Riley [name]  MR#:         [id number] BIRTHDATE: Jul 03, 1945 , 66  yrs. old GENDER: Male ENDOSCOPIST: R.  Roetta Sessions, MD FACP FACG REFERRED BY:  Artis Delay, M.D. PROCEDURE DATE:  11/26/2012 PROCEDURE:     Colonoscopy with biopsy and snare polypectomy  INDICATIONS: altered bowel habits; no prior colonoscopy in the colon; " protruding hemorrhoids". Bowel symptoms much better with the addition of MiraLax to his regimen recently.  INFORMED CONSENT:  The risks, benefits, alternatives and imponderables including but not limited to bleeding, perforation as well as the possibility of a missed lesion have been reviewed.  The potential for biopsy, lesion removal, etc. have also been discussed.  Questions have been answered.  All parties agreeable. Please see the history and physical in the medical record for more information.  MEDICATIONS: Versed 5 mg IV and Demerol 75 mg IV in divided doses. Zofran 4 mg IV  DESCRIPTION OF PROCEDURE:  After a digital rectal exam was performed, the EC-[id number] ([id number])  colonoscope was advanced from the anus through the rectum and colon to the area of the cecum, ileocecal valve and appendiceal orifice.  The cecum was deeply intubated.  These structures were well-seen and photographed for the record.  From the level of the cecum and ileocecal valve, the scope was slowly and cautiously withdrawn.  The mucosal surfaces were carefully surveyed utilizing scope tip deflection to facilitate fold flattening as needed.  The scope was pulled down into the rectum where a thorough examination including retroflexion was performed.    FINDINGS:  Adequate preparation. Patient has grade 2 hemorrhoids; otherwise normal rectum. Long tortuous colon. Pancolonic diverticulosis;  at the splenic flexure the patient had (3) polyps-the largest being approximately 6 mm.  It was pedunculated. There was another 5 mm pedunculated polyp at the hepatic flexure. The remainder of the colonic mucosa appeared normal.  THERAPEUTIC / DIAGNOSTIC MANEUVERS PERFORMED:  The above-mentioned polyps were either hot or cold snare removed at or cold biopsied. The larger polyp splenic flexure was inadvertently cold snared  - we had a little difficulty with the wire hookup to the ERBIE unit in current did not get applied as the polyp was cut through with the snare. This did produce minimal bleeding for which a instinct clip was applied achieving good hemostasis.  COMPLICATIONS: None  CECAL WITHDRAWAL TIME:  9 minutes  IMPRESSION:  Grade 2 hemorrhoids. Pancolonic diverticulosis. Colonic polyps removed as described above. One hemostasis clip applied  RECOMMENDATIONS: Resume Coumadin today. No MRI until clip has passed. Add Benefiber 2 teaspoons twice daily to the regimen. Continue MiraLax one capful daily.  Followup on pathology  Office visit with [place] in 3 months.   _______________________________ eSigned:  R. Roetta Sessions, MD FACP [facility] 11/26/2012 11:24 AM   CC:    PATIENT NAME:  [name], [name] MR#: [id number]

## 2012-11-26 NOTE — Interval H&P Note (Signed)
History and Physical Interval Note:  11/26/2012 10:30 AM  Riley [name]  has presented today for surgery, with the diagnosis of CONSTIPATION, HEMORRHOID  The various methods of treatment have been discussed with the patient and family. After consideration of risks, benefits and other options for treatment, the patient has consented to  Procedure(s) with comments: COLONOSCOPY (N/A) - 10;45 as a surgical intervention .  The patient's history has been reviewed, patient examined, no change in status, stable for surgery.  I have reviewed the patient's chart and labs.  Questions were answered to the patient's satisfaction.     MiraLax daily has been of great benefit in combating constipation/straining. Still describing grade 2 hemorrhoids, however. Colonoscopy today per plan.The risks, benefits, limitations, alternatives and imponderables have been reviewed with the patient. Questions have been answered. All parties are agreeable.   Eula Listen

## 2012-11-26 NOTE — H&P (View-Only) (Signed)
Primary Care Physician:  FUSCO,LAWRENCE J., MD  Primary Gastroenterologist:  Michael Rourk, MD   Chief Complaint  Patient presents with  . Constipation  . Colonoscopy  . Hemorrhoids    HPI:  Riley [name] is a 66 y.o. male here at the request of Dr. Fusco for further evaluation of constipation, hemorrhoids. Patient states he has to strain a lot to have a bowel movement. This pushes his hemorrhoids out. Prep H right now. Trying colace, Gummie Fiber, Colon cleanser. Colon cleanser helps a little. Eats a lot of vegetables. Drinks half a gallon of water per day. Denies rectal bleeding or melena. In morning, aching in lower back/low abdomen and goes up to epigastric. Umbilical hernia. Diastasis recti. Lost from 230lb to 223lb intentionally. Cuts back on portion size and feels better. No dysphagia. No heartburn.  BM every 2-3 days. No prior colonoscopy.  On Coumadin for A.fib. No prior CVA. Reports he was recently given the option to stop Coumadin given the infrequent A. fib. He opted to continue the medication.  Current Outpatient Prescriptions  Medication Sig Dispense Refill  . acebutolol (SECTRAL) 200 MG capsule Take 1 capsule (200 mg total) by mouth daily.  30 capsule  6  . chlorthalidone (HYGROTON) 25 MG tablet Take 0.5 tablets (12.5 mg total) by mouth daily.  90 tablet  3  . COUMADIN 5 MG tablet TAKE AS DIRECTED BY COUMADIN CLINIC.  60 tablet  3  . diltiazem (CARDIZEM) 30 MG tablet Only as needed for tachycardia (palpitations)  30 tablet  3  . docusate sodium (COLACE) 100 MG capsule Take 100 mg by mouth 2 (two) times daily.      . doxazosin (CARDURA) 4 MG tablet 3 tabs DAILY      . lisinopril (PRINIVIL,ZESTRIL) 40 MG tablet TAKE ONE TABLET DAILY.  30 tablet  6  . pravastatin (PRAVACHOL) 40 MG tablet Take 40 mg by mouth daily.      . ranitidine (ZANTAC) 150 MG tablet Take 150 mg by mouth 2 (two) times daily.        . sitaGLIPtan-metformin (JANUMET) 50-1000 MG per tablet Take 1 tablet by mouth  2 (two) times daily with a meal.      . verapamil (VERELAN PM) 360 MG 24 hr capsule Take 1 capsule (360 mg total) by mouth at bedtime.  30 capsule  6   No current facility-administered medications for this visit.    Allergies as of 11/20/2012  . (No Known Allergies)    Past Medical History  Diagnosis Date  . Paroxysmal atrial fibrillation     12/2005; sinus bradycardia secondary to medications  . Chronic anticoagulation   . Hypertension     normal coronary angiography and normal EF in 12/98  . Diabetes mellitus, type 2     no insulin  . Chronic back pain   . Nodule of left lung     Granuloma in left upper lobe  . Edema   . Hyperlipidemia   . Sleep apnea     Past Surgical History  Procedure Laterality Date  . Appendectomy  1998  . Tonsillectomy      as a child  . Colonoscopy  Never    Family History  Problem Relation Age of Onset  . Heart disease Father   . Heart disease Mother   . Allergies      whole family  . Bronchitis Mother   . Colon cancer Neg Hx     History   Social History  .   Marital Status: Married    Spouse Name: N/A    Number of Children: N/A  . Years of Education: N/A   Occupational History  . 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 Smoking status: Never Smoker   . Smokeless tobacco: Never Used  . Alcohol Use: No  . Drug Use: No  . Sexual Activity: Not on file   Other Topics Concern  . Not on file   Social History Narrative  . No narrative on file      ROS:  General: Negative for anorexia, unintentional weight loss, fever, chills, fatigue, weakness. Eyes: Negative for vision changes.  ENT: Negative for hoarseness, difficulty swallowing , nasal congestion. CV: Negative for chest pain, angina, palpitations, dyspnea on exertion, peripheral edema.  Respiratory: Negative for dyspnea at rest, dyspnea on exertion, cough, sputum, wheezing.  GI: See history of present illness. GU:  Negative for  dysuria, hematuria, urinary incontinence, urinary frequency, nocturnal urination.  MS: Negative for joint pain, low back pain. See history of present illness Derm: Negative for rash or itching.  Neuro: Negative for weakness, abnormal sensation, seizure, frequent headaches, memory loss, confusion.  Psych: Negative for anxiety, depression, suicidal ideation, hallucinations.  Endo: Negative for unusual weight change.  Heme: Negative for bruising or bleeding. Allergy: Negative for rash or hives.    Physical Examination:  BP 152/79  Pulse 51  Temp(Src) 97.8 F (36.6 C) (Oral)  Ht 6' 1" (1.854 m)  Wt 223 lb 9.6 oz (101.424 kg)  BMI 29.51 kg/m2   General: Well-nourished, well-developed in no acute distress. Accompanied by wife Head: Normocephalic, atraumatic.   Eyes: Conjunctiva pink, no icterus. Mouth: Oropharyngeal mucosa moist and pink , no lesions erythema or exudate. Neck: Supple without thyromegaly, masses, or lymphadenopathy.  Lungs: Clear to auscultation bilaterally.  Heart: Regular rate and rhythm, no murmurs rubs or gallops.  Abdomen: Bowel sounds are normal, nontender, nondistended, no hepatosplenomegaly or masses, no abdominal bruits, no rebound or guarding.  Golfball sized umbilical hernia easily reducible nontender. Positive diastases recti. Rectal: Not performed Extremities: No lower extremity edema. No clubbing or deformities.  Neuro: Alert and oriented x 4 , grossly normal neurologically.  Skin: Warm and dry, no rash or jaundice.   Psych: Alert and cooperative, normal mood and affect.   

## 2012-11-27 ENCOUNTER — Encounter: Payer: Self-pay | Admitting: Internal Medicine

## 2012-11-28 ENCOUNTER — Encounter (HOSPITAL_COMMUNITY): Payer: Self-pay | Admitting: Internal Medicine

## 2012-12-04 ENCOUNTER — Ambulatory Visit (INDEPENDENT_AMBULATORY_CARE_PROVIDER_SITE_OTHER): Payer: Medicare Other | Admitting: *Deleted

## 2012-12-04 DIAGNOSIS — Z7901 Long term (current) use of anticoagulants: Secondary | ICD-10-CM

## 2012-12-04 DIAGNOSIS — I4891 Unspecified atrial fibrillation: Secondary | ICD-10-CM

## 2012-12-04 DIAGNOSIS — I480 Paroxysmal atrial fibrillation: Secondary | ICD-10-CM

## 2012-12-04 LAB — POCT INR: INR: 1.6

## 2012-12-12 ENCOUNTER — Ambulatory Visit (INDEPENDENT_AMBULATORY_CARE_PROVIDER_SITE_OTHER): Payer: Medicare Other | Admitting: Pulmonary Disease

## 2012-12-12 ENCOUNTER — Encounter: Payer: Self-pay | Admitting: Pulmonary Disease

## 2012-12-12 VITALS — BP 112/60 | HR 50 | Temp 97.9°F | Ht 73.0 in | Wt 224.0 lb

## 2012-12-12 DIAGNOSIS — G4733 Obstructive sleep apnea (adult) (pediatric): Secondary | ICD-10-CM

## 2012-12-12 DIAGNOSIS — I10 Essential (primary) hypertension: Secondary | ICD-10-CM

## 2012-12-12 NOTE — Patient Instructions (Signed)
Your CPAP is set 11 cm

## 2012-12-12 NOTE — Assessment & Plan Note (Addendum)
Ct CPAP 11 cm Weight loss encouraged, compliance with goal of at least 4-6 hrs every night is the expectation. Advised against medications with sedative side effects Cautioned against driving when sleepy - understanding that sleepiness will vary on a day to day basis  He will get pneumovax from PCP FLu shot next week planned

## 2012-12-12 NOTE — Progress Notes (Signed)
  Subjective:    Patient ID: Riley [name], male    DOB: 09/28/1945, 67 y.o.   MRN: [id number]  HPI  67 year old never smoker with atrial fibrillation for FU of obstructive sleep apnea.  He had an admission for atrial fibrillation and was observed to have apneas and snoring.  He is maintained on a regimen of Acebutalol and Cardizem as needed for palpitations to avoid ER visits. Takes Nasonex for nasal stuffiness.  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  09/2012 PSG >>He did not sleep well- TST 128 mins - only 2-3 hrs of sleep  -RDI 37/h- stopped breathing 20 times/h  Started on autoCPAP 5-15 med FF mask  Download 08/28/12 >> AHI 6/h, usage 5h, avg pr 11 cm   12/12/2012  Download 10/24/12 -CPAP 11 cm iseffective  Good usage  AHI 6/h- ok  Pt reports he wears his CPAP everynight x 6-7 hrs a night. He is still having problems with dryness around his lips. At times he feels like the pressure is a little too much. He has been trying to work on it. He has adjusted the humidity. He feels pretty good during the day.  Review of Systems  neg for any significant sore throat, dysphagia, itching, sneezing, nasal congestion or excess/ purulent secretions, fever, chills, sweats, unintended wt loss, pleuritic or exertional cp, hempoptysis, orthopnea pnd or change in chronic leg swelling. Also denies presyncope, palpitations, heartburn, abdominal pain, nausea, vomiting, diarrhea or change in bowel or urinary habits, dysuria,hematuria, rash, arthralgias, visual complaints, headache, numbness weakness or ataxia.     Objective:   Physical Exam  Gen. Pleasant, well-nourished, in no distress ENT - no lesions, no post nasal drip Neck: No JVD, no thyromegaly, no carotid bruits Lungs: no use of accessory muscles, no dullness to percussion, clear without rales or rhonchi  Cardiovascular: Rhythm regular, heart sounds  normal, no murmurs or gallops, no peripheral edema Musculoskeletal: No  deformities, no cyanosis or clubbing         Assessment & Plan:

## 2012-12-18 ENCOUNTER — Ambulatory Visit (INDEPENDENT_AMBULATORY_CARE_PROVIDER_SITE_OTHER): Payer: Medicare Other | Admitting: *Deleted

## 2012-12-18 DIAGNOSIS — I480 Paroxysmal atrial fibrillation: Secondary | ICD-10-CM

## 2012-12-18 DIAGNOSIS — Z7901 Long term (current) use of anticoagulants: Secondary | ICD-10-CM

## 2012-12-18 DIAGNOSIS — I4891 Unspecified atrial fibrillation: Secondary | ICD-10-CM

## 2013-01-07 ENCOUNTER — Other Ambulatory Visit: Payer: Self-pay | Admitting: Cardiology

## 2013-01-15 ENCOUNTER — Ambulatory Visit (INDEPENDENT_AMBULATORY_CARE_PROVIDER_SITE_OTHER): Payer: Medicare Other | Admitting: *Deleted

## 2013-01-15 DIAGNOSIS — Z7901 Long term (current) use of anticoagulants: Secondary | ICD-10-CM

## 2013-01-15 DIAGNOSIS — I480 Paroxysmal atrial fibrillation: Secondary | ICD-10-CM

## 2013-01-15 DIAGNOSIS — I4891 Unspecified atrial fibrillation: Secondary | ICD-10-CM

## 2013-01-15 LAB — POCT INR: INR: 1.7

## 2013-01-15 MED ORDER — WARFARIN SODIUM 5 MG PO TABS: ORAL_TABLET | ORAL | Status: DC

## 2013-01-21 ENCOUNTER — Encounter (HOSPITAL_COMMUNITY): Payer: Self-pay

## 2013-01-21 NOTE — H&P (Signed)
NTS SOAP Note  Vital Signs:  Vitals as of: 01/21/2013: Systolic 146: Diastolic 81: Heart Rate 45: Temp 98.67F: Height 23ft 1in: Weight 225Lbs 0 Ounces: BMI 29.68  BMI : 29.68 kg/m2  Subjective: This 36 Years 8 Months old Male presents for of    HERNIA: ,Has had a large umbilical hernia for many years.  Recently is starting to cause him discomfort.  Made worse with straining.  No nausea, vomiting noted.  Review of Symptoms:  Constitutional:unremarkable   Head:unremarkable    Eyes:unremarkable   Nose/Mouth/Throat:unremarkable     no chest pain,  heart stays primarily in regular rhythym Respiratory:unremarkable   Gastrointestinal:  unremarkable   Genitourinary:unremarkable       back Skin:unremarkable Hematolgic/Lymphatic:unremarkable     Allergic/Immunologic:unremarkable     Past Medical History:    Reviewed   Past Medical History  Surgical History: appy, tonsillectomy Medical Problems: chronic atrial fibrillation, DM, HTN, high cholesterol, sleep apnea Allergies: nkda Medications: coumadin, lisinopril, janumet, zantac, diltiazem, HCTZ, lasix, cardura   Social History:Reviewed  Social History  Preferred Language: English Race:  White Ethnicity: Not Hispanic / Latino Age: 67 Years 11 Months Marital Status:  M Alcohol:  No Recreational drug(s):  No   Smoking Status: Never smoker reviewed on 01/21/2013 Functional Status reviewed on mm/dd/yyyy ------------------------------------------------ Bathing: Normal Cooking: Normal Dressing: Normal Driving: Normal Eating: Normal Managing Meds: Normal Oral Care: Normal Shopping: Normal Toileting: Normal Transferring: Normal Walking: Normal Cognitive Status reviewed on mm/dd/yyyy ------------------------------------------------ Attention: Normal Decision Making: Normal Language: Normal Memory: Normal Motor: Normal Perception: Normal Problem Solving: Normal Visual and  Spatial: Normal   Family History:  Reviewed  Family Health History Mother, Deceased; Heart attack (myocardial infarction); Stroke (CVA);  Father, Deceased; Heart attack (myocardial infarction); Stroke (CVA);     Objective Information: General:  Well appearing, well nourished in no distress. Heart:  RRR, no murmur Lungs:    CTA bilaterally, no wheezes, rhonchi, rales.  Breathing unlabored. Abdomen:Soft, NT/ND, no HSM, no masses.  Large reducible umbilical hernia present.  Assessment:Umbilical hernia, sleep apnea, chronic anticoagulation  Diagnosis &amp; Procedure Smart Code   Plan:  Scheduled for umbilical herniorrhaphy with mesh on 01/27/13.   Patient Education:Alternative treatments to surgery were discussed with patient (and family).  Risks and benefits  of procedure bleeding, infection, cardiopulmonary difficulties, and recurrence of the hernia were fully explained to the patient (and family) who gave informed consent. Patient/family questions were addressed.  Will stop coumadin fouor days prior to surgery.  To bring cpap machine with him to hospital for overnight observation.  Follow-up:Pending Surgery

## 2013-01-22 NOTE — Patient Instructions (Addendum)
Riley [name]  01/22/2013   Your procedure is scheduled on:   01/27/2013  Report to [facility] at   6:15  AM.  Call this number if you have problems the morning of surgery: [phone]   Remember:   Do not eat food or drink liquids after midnight.   Take these medicines the morning of surgery with A SIP OF WATER: Sectral, Chlorthalidone, Diltiazem, Doxazosin, Lisinopril, Ranitidine and Verapamil.   Do not wear jewelry, make-up or nail polish.  Do not wear lotions, powders, or perfumes.   Do not shave 48 hours prior to surgery. Men may shave face and neck.  Do not bring valuables to the hospital.  [facility] is not responsible for any belongings or valuables.               Contacts, dentures or bridgework may not be worn into surgery.  Leave suitcase in the car. After surgery it may be brought to your room.  For patients admitted to the hospital, discharge time is determined by your treatment team.               Patients discharged the day of surgery will not be allowed to drive home.  Name and phone number of your driver: family  Special Instructions: Shower using CHG 2 nights before surgery and the night before surgery.  If you shower the day of surgery use CHG.  Use special wash - you have one bottle of CHG for all showers.  You should use approximately 1/3 of the bottle for each shower.   Please read over the following fact sheets that you were given: Pain Booklet, Coughing and Deep Breathing, Surgical Site Infection Prevention, Anesthesia Post-op Instructions and Care and Recovery After Surgery   Hernia A hernia happens when an organ inside your body pushes out through a weak spot in your belly (abdominal) wall. Most hernias get worse over time. They can often be pushed back into place (reduced). Surgery may be needed to repair hernias that cannot be pushed into place. HOME CARE  Keep doing normal activities.  Avoid lifting more than 10 pounds (4.5 kilograms).  Cough  gently and avoid straining. Over time, these things will:  Increase your hernia size.  Irritate your hernia.  Break down hernia repairs.  Stop smoking.  Do not wear anything tight over your hernia. Do not keep the hernia in with an outside bandage.  Eat food that is high in fiber (fruit, vegetables, whole grains).  Drink enough fluids to keep your pee (urine) clear or pale yellow.  Take medicines to make your poop soft (stool softeners) if you cannot poop (constipated). GET HELP RIGHT AWAY IF:   You have a fever.  You have belly pain that gets worse.  You feel sick to your stomach (nauseous) and throw up (vomit).  Your skin starts to bulge out.  Your hernia turns a different color, feels hard, or is tender.  You have increased pain or puffiness (swelling) around the hernia.  You poop more or less often.  Your poop does not look the way normally does.  You have watery poop (diarrhea).  You cannot push the hernia back in place by applying gentle pressure while lying down. MAKE SURE YOU:   Understand these instructions.  Will watch your condition.  Will get help right away if you are not doing well or get worse. Document Released: 08/10/2009 Document Revised: 05/15/2011 Document Reviewed: 08/10/2009 [employer] Patient Information  2014 [place], [place].   Hernia Repair Care After These instructions give you information on caring for yourself after your procedure. Your doctor may also give you more specific instructions. Call your doctor if you have any problems or questions after your procedure. HOME CARE   You may have changes in your poops (bowel movements).  You may have loose or watery poop (diarrhea).  You may be not able to poop.  Your bowels will slowly get back to normal.  Do not eat any food that makes you sick to your stomach (nauseous). Eat small meals 4 to 6 times a day instead of 3 large ones.  Do not drink pop. It will give you gas.  Do not  drink alcohol.  Do not lift anything heavier than 10 pounds. This is about the weight of a gallon of milk.  Do not do anything that makes you very tired for at least 6 weeks.  Do not get your wound wet for 2 days.  You may take a sponge bath during this time.  After 2 days you may take a shower. Gently pat your surgical cut (incision) dry with a towel. Do not rub it.  For men: You may have been given an athletic supporter (scrotal support) before you left the hospital. It holds your scrotum and testicles closer to your body so there is no strain on your wound. Wear the supporter until your doctor tells you that you do not need it anymore. GET HELP RIGHT AWAY IF:  You have watery poop, or cannot poop for more than 3 days.  You feel sick to your stomach or throw up (vomit) more than 2 or 3 times.  You have temperature by mouth above 102 F (38.9 C).  You see redness or puffiness (swelling) around your wound.  You see yellowish Akhil Piscopo fluid (pus) coming from your wound.  You see a bulge or bump in your lower belly (abdomen) or near your groin.  You develop a rash, trouble breathing, or any other symptoms from medicines taken. MAKE SURE YOU:  Understand these instructions.  Will watch your condition.  Will get help right away if your are not doing well or get worse. Document Released: 02/03/2008 Document Revised: 05/15/2011 Document Reviewed: 02/03/2008 [facility] Patient Information 2014 [place], [place].   PATIENT INSTRUCTIONS POST-ANESTHESIA  IMMEDIATELY FOLLOWING SURGERY:  Do not drive or operate machinery for the first twenty four hours after surgery.  Do not make any important decisions for twenty four hours after surgery or while taking narcotic pain medications or sedatives.  If you develop intractable nausea and vomiting or a severe headache please notify your doctor immediately.  FOLLOW-UP:  Please make an appointment with your surgeon as instructed. You do not need to  follow up with anesthesia unless specifically instructed to do so.  WOUND CARE INSTRUCTIONS (if applicable):  Keep a dry clean dressing on the anesthesia/puncture wound site if there is drainage.  Once the wound has quit draining you may leave it open to air.  Generally you should leave the bandage intact for twenty four hours unless there is drainage.  If the epidural site drains for more than 36-48 hours please call the anesthesia department.  QUESTIONS?:  Please feel free to call your physician or the hospital operator if you have any questions, and they will be happy to assist you.

## 2013-01-23 ENCOUNTER — Encounter (HOSPITAL_COMMUNITY): Payer: Self-pay

## 2013-01-23 ENCOUNTER — Encounter (HOSPITAL_COMMUNITY)
Admission: RE | Admit: 2013-01-23 | Discharge: 2013-01-23 | Disposition: A | Discharge status: Home / Self Care | Payer: Medicare Other | Source: Ambulatory Visit | Attending: General Surgery | Admitting: General Surgery

## 2013-01-23 DIAGNOSIS — Z01818 Encounter for other preprocedural examination: Secondary | ICD-10-CM | POA: Insufficient documentation

## 2013-01-23 DIAGNOSIS — Z01812 Encounter for preprocedural laboratory examination: Secondary | ICD-10-CM | POA: Insufficient documentation

## 2013-01-23 HISTORY — DX: Gastro-esophageal reflux disease without esophagitis: K21.9

## 2013-01-23 LAB — CBC WITH DIFFERENTIAL/PLATELET
Basophils Absolute: 0 10*3/uL (ref 0.0–0.1)
Basophils Relative: 0 % (ref 0–1)
Eosinophils Absolute: 0.1 10*3/uL (ref 0.0–0.7)
Eosinophils Relative: 1 % (ref 0–5)
HCT: 43.6 % (ref 39.0–52.0)
Hemoglobin: 14.8 g/dL (ref 13.0–17.0)
Lymphocytes Relative: 23 % (ref 12–46)
Lymphs Abs: 1.2 10*3/uL (ref 0.7–4.0)
MCH: 30.4 pg (ref 26.0–34.0)
MCHC: 33.9 g/dL (ref 30.0–36.0)
MCV: 89.5 fL (ref 78.0–100.0)
Monocytes Absolute: 0.4 10*3/uL (ref 0.1–1.0)
Monocytes Relative: 7 % (ref 3–12)
Neutro Abs: 3.6 10*3/uL (ref 1.7–7.7)
Neutrophils Relative %: 68 % (ref 43–77)
Platelets: 162 10*3/uL (ref 150–400)
RBC: 4.87 MIL/uL (ref 4.22–5.81)
RDW: 13.3 % (ref 11.5–15.5)
WBC: 5.2 10*3/uL (ref 4.0–10.5)

## 2013-01-23 LAB — BASIC METABOLIC PANEL
Creatinine, Ser: 1.19 mg/dL (ref 0.50–1.35)
GFR calc non Af Amer: 62 mL/min — ABNORMAL LOW (ref >90)
Glucose, Bld: 159 mg/dL — ABNORMAL HIGH (ref 70–99)
Potassium: 3.8 mEq/L (ref 3.5–5.1)
Sodium: 139 mEq/L (ref 135–145)

## 2013-01-23 LAB — PROTIME-INR: INR: 2.63 — ABNORMAL HIGH (ref 0.00–1.49)

## 2013-01-24 NOTE — OR Nursing (Signed)
Pt INR reported to Dr. Lovell Sheehan.  Orders are to repeat pt /  inr on arrival to preop

## 2013-01-27 ENCOUNTER — Ambulatory Visit (HOSPITAL_COMMUNITY): Payer: Medicare Other | Admitting: Anesthesiology

## 2013-01-27 ENCOUNTER — Telehealth: Payer: Self-pay | Admitting: Cardiology

## 2013-01-27 ENCOUNTER — Ambulatory Visit (HOSPITAL_COMMUNITY)
Admission: RE | Admit: 2013-01-27 | Discharge: 2013-01-27 | Disposition: A | Discharge status: Home / Self Care | Payer: Medicare Other | Source: Ambulatory Visit | Attending: General Surgery | Admitting: General Surgery

## 2013-01-27 ENCOUNTER — Encounter (HOSPITAL_COMMUNITY): Payer: Medicare Other | Admitting: Anesthesiology

## 2013-01-27 ENCOUNTER — Encounter (HOSPITAL_COMMUNITY)
Admission: RE | Disposition: A | Discharge status: Home / Self Care | Payer: Self-pay | Source: Ambulatory Visit | Attending: General Surgery

## 2013-01-27 DIAGNOSIS — Z01812 Encounter for preprocedural laboratory examination: Secondary | ICD-10-CM | POA: Insufficient documentation

## 2013-01-27 DIAGNOSIS — K429 Umbilical hernia without obstruction or gangrene: Secondary | ICD-10-CM | POA: Insufficient documentation

## 2013-01-27 DIAGNOSIS — E119 Type 2 diabetes mellitus without complications: Secondary | ICD-10-CM | POA: Insufficient documentation

## 2013-01-27 DIAGNOSIS — I10 Essential (primary) hypertension: Secondary | ICD-10-CM | POA: Insufficient documentation

## 2013-01-27 DIAGNOSIS — I4891 Unspecified atrial fibrillation: Secondary | ICD-10-CM | POA: Insufficient documentation

## 2013-01-27 DIAGNOSIS — E7800 Pure hypercholesterolemia, unspecified: Secondary | ICD-10-CM | POA: Insufficient documentation

## 2013-01-27 HISTORY — PX: INSERTION OF MESH: SHX5868

## 2013-01-27 HISTORY — PX: UMBILICAL HERNIA REPAIR: SHX196

## 2013-01-27 LAB — PROTIME-INR
INR: 1.2 (ref 0.00–1.49)
Prothrombin Time: 14.9 seconds (ref 11.6–15.2)

## 2013-01-27 LAB — GLUCOSE, CAPILLARY
Glucose-Capillary: 132 mg/dL — ABNORMAL HIGH (ref 70–99)
Glucose-Capillary: 149 mg/dL — ABNORMAL HIGH (ref 70–99)

## 2013-01-27 SURGERY — INSERTION OF MESH
Anesthesia: General | Site: Abdomen | Wound class: Clean

## 2013-01-27 MED ORDER — FENTANYL CITRATE 0.05 MG/ML IJ SOLN
INTRAMUSCULAR | Status: AC
  Filled 2013-01-27: qty 2

## 2013-01-27 MED ORDER — GLYCOPYRROLATE 0.2 MG/ML IJ SOLN
INTRAMUSCULAR | Status: DC | PRN
  Administered 2013-01-27: 0.2 mg via INTRAVENOUS

## 2013-01-27 MED ORDER — GLYCOPYRROLATE 0.2 MG/ML IJ SOLN
INTRAMUSCULAR | Status: AC
  Filled 2013-01-27: qty 1

## 2013-01-27 MED ORDER — LIDOCAINE HCL (PF) 1 % IJ SOLN
INTRAMUSCULAR | Status: AC
  Filled 2013-01-27: qty 5

## 2013-01-27 MED ORDER — PROPOFOL 10 MG/ML IV EMUL
INTRAVENOUS | Status: AC
  Filled 2013-01-27: qty 20

## 2013-01-27 MED ORDER — GLYCOPYRROLATE 0.2 MG/ML IJ SOLN
0.2000 mg | Freq: Once | INTRAMUSCULAR | Status: AC
  Administered 2013-01-27: 0.2 mg via INTRAVENOUS

## 2013-01-27 MED ORDER — CEFAZOLIN SODIUM-DEXTROSE 2-3 GM-% IV SOLR
INTRAVENOUS | Status: AC
  Filled 2013-01-27: qty 50

## 2013-01-27 MED ORDER — MIDAZOLAM HCL 2 MG/2ML IJ SOLN
INTRAMUSCULAR | Status: AC
  Filled 2013-01-27: qty 2

## 2013-01-27 MED ORDER — ONDANSETRON HCL 4 MG/2ML IJ SOLN
INTRAMUSCULAR | Status: AC
  Filled 2013-01-27: qty 2

## 2013-01-27 MED ORDER — FENTANYL CITRATE 0.05 MG/ML IJ SOLN
INTRAMUSCULAR | Status: DC | PRN
  Administered 2013-01-27: 50 ug via INTRAVENOUS
  Administered 2013-01-27 (×2): 25 ug via INTRAVENOUS

## 2013-01-27 MED ORDER — CEFAZOLIN SODIUM-DEXTROSE 2-3 GM-% IV SOLR
2.0000 g | INTRAVENOUS | Status: AC
  Administered 2013-01-27: 2 g via INTRAVENOUS

## 2013-01-27 MED ORDER — BUPIVACAINE HCL (PF) 0.5 % IJ SOLN
INTRAMUSCULAR | Status: DC | PRN
  Administered 2013-01-27: 10 mL

## 2013-01-27 MED ORDER — EPHEDRINE SULFATE 50 MG/ML IJ SOLN
INTRAMUSCULAR | Status: DC | PRN
  Administered 2013-01-27: 5 mg via INTRAVENOUS

## 2013-01-27 MED ORDER — MIDAZOLAM HCL 2 MG/2ML IJ SOLN
1.0000 mg | INTRAMUSCULAR | Status: DC | PRN
  Administered 2013-01-27: 2 mg via INTRAVENOUS

## 2013-01-27 MED ORDER — LACTATED RINGERS IV SOLN
INTRAVENOUS | Status: DC
  Administered 2013-01-27: 1000 mL via INTRAVENOUS

## 2013-01-27 MED ORDER — SODIUM CHLORIDE BACTERIOSTATIC 0.9 % IJ SOLN
INTRAMUSCULAR | Status: AC
  Filled 2013-01-27: qty 10

## 2013-01-27 MED ORDER — MIDAZOLAM HCL 5 MG/5ML IJ SOLN
INTRAMUSCULAR | Status: DC | PRN
  Administered 2013-01-27: 2 mg via INTRAVENOUS

## 2013-01-27 MED ORDER — ENOXAPARIN SODIUM 40 MG/0.4ML ~~LOC~~ SOLN
40.0000 mg | Freq: Once | SUBCUTANEOUS | Status: AC
Start: 2013-01-27 — End: 2013-01-27
  Administered 2013-01-27: 40 mg via SUBCUTANEOUS

## 2013-01-27 MED ORDER — HYDROCODONE-ACETAMINOPHEN 5-325 MG PO TABS: 1.0000 | ORAL_TABLET | ORAL | Status: DC | PRN

## 2013-01-27 MED ORDER — ONDANSETRON HCL 4 MG/2ML IJ SOLN
4.0000 mg | Freq: Once | INTRAMUSCULAR | Status: AC
  Administered 2013-01-27: 4 mg via INTRAVENOUS

## 2013-01-27 MED ORDER — 0.9 % SODIUM CHLORIDE (POUR BTL) OPTIME
TOPICAL | Status: DC | PRN
  Administered 2013-01-27: 1000 mL

## 2013-01-27 MED ORDER — PROPOFOL 10 MG/ML IV BOLUS
INTRAVENOUS | Status: DC | PRN
  Administered 2013-01-27: 200 mg via INTRAVENOUS

## 2013-01-27 MED ORDER — FENTANYL CITRATE 0.05 MG/ML IJ SOLN: 25.0000 ug | INTRAMUSCULAR | Status: DC | PRN

## 2013-01-27 MED ORDER — ONDANSETRON HCL 4 MG/2ML IJ SOLN: 4.0000 mg | Freq: Once | INTRAMUSCULAR | Status: DC | PRN

## 2013-01-27 MED ORDER — WARFARIN SODIUM 5 MG PO TABS: ORAL_TABLET | ORAL | Status: DC

## 2013-01-27 MED ORDER — BUPIVACAINE HCL (PF) 0.5 % IJ SOLN
INTRAMUSCULAR | Status: AC
  Filled 2013-01-27: qty 30

## 2013-01-27 MED ORDER — FENTANYL CITRATE 0.05 MG/ML IJ SOLN
25.0000 ug | INTRAMUSCULAR | Status: AC
  Administered 2013-01-27: 25 ug via INTRAVENOUS

## 2013-01-27 MED ORDER — LIDOCAINE HCL 1 % IJ SOLN
INTRAMUSCULAR | Status: DC | PRN
  Administered 2013-01-27: 50 mg via INTRADERMAL

## 2013-01-27 MED ORDER — KETOROLAC TROMETHAMINE 30 MG/ML IJ SOLN
30.0000 mg | Freq: Once | INTRAMUSCULAR | Status: AC
  Administered 2013-01-27: 30 mg via INTRAVENOUS
  Filled 2013-01-27: qty 1

## 2013-01-27 MED ORDER — EPHEDRINE SULFATE 50 MG/ML IJ SOLN
INTRAMUSCULAR | Status: AC
  Filled 2013-01-27: qty 1

## 2013-01-27 SURGICAL SUPPLY — 38 items
BAG HAMPER (MISCELLANEOUS) ×2 IMPLANT
BALL CTTN STRL GZE (GAUZE/BANDAGES/DRESSINGS) ×1
BLADE SURG SZ11 CARB STEEL (BLADE) ×2 IMPLANT
CLOTH BEACON ORANGE TIMEOUT ST (SAFETY) ×2 IMPLANT
COTTON BALL STERILE (GAUZE/BANDAGES/DRESSINGS) ×2
COTTON BALL STERILE 2 PK (GAUZE/BANDAGES/DRESSINGS) IMPLANT
COVER LIGHT HANDLE STERIS (MISCELLANEOUS) ×4 IMPLANT
DECANTER SPIKE VIAL GLASS SM (MISCELLANEOUS) ×2 IMPLANT
DURAPREP 26ML APPLICATOR (WOUND CARE) ×2 IMPLANT
ELECT REM PT RETURN 9FT ADLT (ELECTROSURGICAL) ×2
ELECTRODE REM PT RTRN 9FT ADLT (ELECTROSURGICAL) ×1 IMPLANT
GLOVE BIOGEL PI IND STRL 8 (GLOVE) ×1 IMPLANT
GLOVE BIOGEL PI INDICATOR 8 (GLOVE) ×1
GLOVE ECLIPSE 6.5 STRL STRAW (GLOVE) ×1 IMPLANT
GLOVE ECLIPSE 7.0 STRL STRAW (GLOVE) ×1 IMPLANT
GLOVE ECLIPSE 7.5 STRL STRAW (GLOVE) ×2 IMPLANT
GLOVE INDICATOR 7.5 STRL GRN (GLOVE) ×2 IMPLANT
GOWN STRL REIN XL XLG (GOWN DISPOSABLE) ×7 IMPLANT
INST SET MINOR GENERAL (KITS) ×2 IMPLANT
KIT ROOM TURNOVER APOR (KITS) ×2 IMPLANT
MANIFOLD NEPTUNE II (INSTRUMENTS) ×2 IMPLANT
NDL HYPO 25X1 1.5 SAFETY (NEEDLE) ×1 IMPLANT
NEEDLE HYPO 25X1 1.5 SAFETY (NEEDLE) ×2 IMPLANT
NS IRRIG 1000ML POUR BTL (IV SOLUTION) ×2 IMPLANT
PACK MINOR (CUSTOM PROCEDURE TRAY) ×2 IMPLANT
PAD ARMBOARD 7.5X6 YLW CONV (MISCELLANEOUS) ×2 IMPLANT
PATCH VENTRAL MEDIUM 6.4 (Mesh Specialty) ×2 IMPLANT
SET BASIN LINEN APH (SET/KITS/TRAYS/PACK) ×2 IMPLANT
SPONGE GAUZE 2X2 8PLY STRL LF (GAUZE/BANDAGES/DRESSINGS) ×4 IMPLANT
SPONGE GAUZE 4X4 12PLY (GAUZE/BANDAGES/DRESSINGS) ×1 IMPLANT
STAPLER VISISTAT (STAPLE) ×2 IMPLANT
SUT ETHIBOND NAB MO 7 #0 18IN (SUTURE) ×2 IMPLANT
SUT VIC AB 2-0 CT2 27 (SUTURE) ×2 IMPLANT
SUT VIC AB 3-0 SH 27 (SUTURE) ×2
SUT VIC AB 3-0 SH 27X BRD (SUTURE) ×1 IMPLANT
SUT VICRYL AB 3 0 TIES (SUTURE) ×1 IMPLANT
SYR CONTROL 10ML LL (SYRINGE) ×2 IMPLANT
TAPE CLOTH SURG 4X10 WHT LF (GAUZE/BANDAGES/DRESSINGS) ×1 IMPLANT

## 2013-01-27 NOTE — Anesthesia Postprocedure Evaluation (Signed)
  Anesthesia Post-op Note  Patient: Riley [name]  Procedure(s) Performed: Procedure(s): INSERTION OF MESH (N/A) HERNIA REPAIR UMBILICAL ADULT (N/A)  Patient Location: PACU  Anesthesia Type:General  Level of Consciousness: awake, alert , oriented and patient cooperative  Airway and Oxygen Therapy: Patient Spontanous Breathing  Post-op Pain: 2 /10, mild  Post-op Assessment: Post-op Vital signs reviewed, Patient's Cardiovascular Status Stable, Respiratory Function Stable, Patent Airway and Pain level controlled  Post-op Vital Signs: Reviewed and stable  Complications: No apparent anesthesia complications

## 2013-01-27 NOTE — Anesthesia Procedure Notes (Signed)
Procedure Name: LMA Insertion Date/Time: 01/27/2013 7:41 AM Performed by: Despina Hidden Pre-anesthesia Checklist: Emergency Drugs available, Patient identified, Suction available and Patient being monitored Patient Re-evaluated:Patient Re-evaluated prior to inductionOxygen Delivery Method: Circle system utilized Preoxygenation: Pre-oxygenation with 100% oxygen Intubation Type: IV induction Ventilation: Mask ventilation without difficulty LMA: LMA inserted LMA Size: 5.0 Tube type: Oral Number of attempts: 1 Placement Confirmation: positive ETCO2 and breath sounds checked- equal and bilateral Tube secured with: Tape Dental Injury: Teeth and Oropharynx as per pre-operative assessment

## 2013-01-27 NOTE — Anesthesia Preprocedure Evaluation (Signed)
Anesthesia Evaluation  Patient identified by MRN, date of birth, ID band Patient awake    Reviewed: Allergy & Precautions, H&P , NPO status , Patient's Chart, lab work & pertinent test results, reviewed documented beta blocker date and time   Airway Mallampati: II TM Distance: >3 FB     Dental  (+) Teeth Intact   Pulmonary sleep apnea and Continuous Positive Airway Pressure Ventilation ,  breath sounds clear to auscultation        Cardiovascular hypertension, Pt. on medications - angina+ dysrhythmias Atrial Fibrillation Rhythm:Regular Rate:Bradycardia     Neuro/Psych    GI/Hepatic GERD-  Controlled and Medicated,  Endo/Other  diabetes, Well Controlled, Type 2, Oral Hypoglycemic Agents  Renal/GU      Musculoskeletal   Abdominal   Peds  Hematology   Anesthesia Other Findings   Reproductive/Obstetrics                           Anesthesia Physical Anesthesia Plan  ASA: III  Anesthesia Plan: General   Post-op Pain Management:    Induction: Intravenous  Airway Management Planned: LMA  Additional Equipment:   Intra-op Plan:   Post-operative Plan: Extubation in OR  Informed Consent: I have reviewed the patients History and Physical, chart, labs and discussed the procedure including the risks, benefits and alternatives for the proposed anesthesia with the patient or authorized representative who has indicated his/her understanding and acceptance.     Plan Discussed with:   Anesthesia Plan Comments:         Anesthesia Quick Evaluation

## 2013-01-27 NOTE — Op Note (Signed)
Patient:  Riley [name]  DOB:  [birth year]  MRN:  [id number]   Preop Diagnosis:  Umbilical hernia  Postop Diagnosis:  Same  Procedure:  Umbilical herniorrhaphy with mesh  Surgeon:  Franky Macho, M.D.  Anes:  General endotracheal  Indications:  Patient is a 67 year old white male who presents with a symptomatic umbilical hernia. The risks and benefits of the procedure including bleeding, infection, and the possibility of recurrence of the hernia were fully explained to the patient, who gave informed consent.  Procedure note:  The patient was placed in the supine position. After general anesthesia was administered, the abdomen was prepped and draped using usual sterile technique with DuraPrep. Surgical site confirmation was performed.  An infraumbilical incision was made down to the fascia. A Veress needle was introduced into the abdominal cavity and confirmation of placement was done using the saline drop test. An incision was then made along the inferior aspect of the umbilicus. This was taken down to the fascia. The umbilicus was freed away from the underlying hernia sac. The hernia sac was large and contained some omentum. The hernia sac was excised down to the fascia. Some excess omentum that was adhesed to the hernia sac was excised and ligated using 2-0 Vicryl ties. Once the underside of the abdominal wall was freed of omentum, a 6.4 cm proceed patch was inserted and the tabs secured to the fascia using 2-0 Ethibond interrupted sutures. The excess fascia was reapproximated over this repair in a transverse fashion using 2-0 Ethibond interrupted sutures. The base the umbilicus was secured back to the fascia using a 2-0 Vicryl suture. Subcutaneous layer was reapproximated using 3-0 Vicryl interrupted suture. The skin was closed using staples. 0.5% Sensorcaine was instilled the surrounding wound. Betadine ointment and dry sterile dressing were applied.  All tape and needle counts were correct at  the end of the procedure. Patient was awakened in the operating room and transferred to the PACU in stable condition.  Complications:  None  EBL:  Minimal  Specimen:  None

## 2013-01-27 NOTE — Interval H&P Note (Signed)
History and Physical Interval Note:  01/27/2013 7:19 AM  Riley [name]  has presented today for surgery, with the diagnosis of umbilical hernia  The various methods of treatment have been discussed with the patient and family. After consideration of risks, benefits and other options for treatment, the patient has consented to  Procedure(s): HERNIA REPAIR UMBILICAL ADULT (N/A) as a surgical intervention .  The patient's history has been reviewed, patient examined, no change in status, stable for surgery.  I have reviewed the patient's chart and labs.  Questions were answered to the patient's satisfaction.     Franky Macho A

## 2013-01-27 NOTE — Telephone Encounter (Signed)
S/P hernia surgery yesterday.  Pt to start back on coumadin tomorrow per Dr Lovell Sheehan.  He was told to take 2 tablets x 3 days then resume 1 1/2 tablets daily.

## 2013-01-27 NOTE — Transfer of Care (Signed)
Immediate Anesthesia Transfer of Care Note  Patient: Riley [name]  Procedure(s) Performed: Procedure(s): INSERTION OF MESH (N/A) HERNIA REPAIR UMBILICAL ADULT (N/A)  Patient Location: PACU  Anesthesia Type:General  Level of Consciousness: awake and patient cooperative  Airway & Oxygen Therapy: Patient Spontanous Breathing and Patient connected to face mask oxygen  Post-op Assessment: Report given to PACU RN, Post -op Vital signs reviewed and stable and Patient moving all extremities  Post vital signs: Reviewed and stable  Complications: No apparent anesthesia complications

## 2013-02-03 ENCOUNTER — Ambulatory Visit: Payer: Medicare Other | Admitting: Gastroenterology

## 2013-02-06 ENCOUNTER — Ambulatory Visit (INDEPENDENT_AMBULATORY_CARE_PROVIDER_SITE_OTHER): Payer: Medicare Other | Admitting: *Deleted

## 2013-02-06 DIAGNOSIS — I4891 Unspecified atrial fibrillation: Secondary | ICD-10-CM

## 2013-02-06 DIAGNOSIS — I480 Paroxysmal atrial fibrillation: Secondary | ICD-10-CM

## 2013-02-06 DIAGNOSIS — Z7901 Long term (current) use of anticoagulants: Secondary | ICD-10-CM

## 2013-02-10 ENCOUNTER — Other Ambulatory Visit: Payer: Self-pay | Admitting: Adult Health

## 2013-02-24 ENCOUNTER — Encounter: Payer: Self-pay | Admitting: Gastroenterology

## 2013-02-24 ENCOUNTER — Ambulatory Visit (INDEPENDENT_AMBULATORY_CARE_PROVIDER_SITE_OTHER): Payer: Medicare Other | Admitting: *Deleted

## 2013-02-24 ENCOUNTER — Encounter (INDEPENDENT_AMBULATORY_CARE_PROVIDER_SITE_OTHER): Payer: Self-pay

## 2013-02-24 ENCOUNTER — Ambulatory Visit (INDEPENDENT_AMBULATORY_CARE_PROVIDER_SITE_OTHER): Payer: Medicare Other | Admitting: Gastroenterology

## 2013-02-24 VITALS — BP 136/81 | HR 53 | Temp 97.6°F | Wt 226.6 lb

## 2013-02-24 DIAGNOSIS — K5900 Constipation, unspecified: Secondary | ICD-10-CM

## 2013-02-24 DIAGNOSIS — I480 Paroxysmal atrial fibrillation: Secondary | ICD-10-CM

## 2013-02-24 DIAGNOSIS — Z7901 Long term (current) use of anticoagulants: Secondary | ICD-10-CM

## 2013-02-24 DIAGNOSIS — I4891 Unspecified atrial fibrillation: Secondary | ICD-10-CM

## 2013-02-24 NOTE — Patient Instructions (Signed)
Continue to take Benefiber every day. Take Miralax daily as needed for constipation.  We will see you back in 6 months!

## 2013-02-24 NOTE — Progress Notes (Signed)
Referring Provider: Elfredia Nevins, MD Primary Care Physician:  Cassell Smiles., MD Primary GI: Dr. Jena Gauss   Chief Complaint  Patient presents with  . Follow-up    HPI:   SAURABH HETTICH presents today in routine follow-up after colonoscopy by Dr. Jena Gauss. Findings as below. Chronic constipation. Benefiber and Miralax daily. Constipation much improved. Bowel habits "smooth". No abdominal pain. Recently had umbilical hernia repair Jan 27, 2013. Doing well post-operatively. No upper GI concerns. No rectal bleeding. Next colonoscopy in 2019.    Past Medical History  Diagnosis Date  . Paroxysmal atrial fibrillation     12/2005; sinus bradycardia secondary to medications  . Chronic anticoagulation   . Hypertension     normal coronary angiography and normal EF in 12/98  . Diabetes mellitus, type 2     no insulin  . Chronic back pain   . Nodule of left lung     Granuloma in left upper lobe  . Edema   . Hyperlipidemia   . Sleep apnea   . GERD (gastroesophageal reflux disease)     Past Surgical History  Procedure Laterality Date  . Appendectomy  1998  . Tonsillectomy      as a child  . Colonoscopy  Never  . Colonoscopy N/A 11/26/2012    VHQ:IONGE 2 hemorrhoids. Pancolonic diverticulosis. Colonic polyps removed as described above. One hemostasis clip applied. TUBULAR ADENOMA. Surveillance Sept 2019.   Marland Kitchen Insertion of mesh N/A 01/27/2013    Procedure: INSERTION OF MESH;  Surgeon: Dalia Heading, MD;  Location: [facility];  Service: General;  Laterality: N/A;  . Umbilical hernia repair N/A 01/27/2013    Procedure: HERNIA REPAIR UMBILICAL ADULT;  Surgeon: Dalia Heading, MD;  Location: [facility];  Service: General;  Laterality: N/A;    Current Outpatient Prescriptions  Medication Sig Dispense Refill  . acebutolol (SECTRAL) 200 MG capsule Take 1 capsule (200 mg total) by mouth daily.  30 capsule  6  . chlorthalidone (HYGROTON) 25 MG tablet Take 12.5 mg by mouth daily.      Marland Kitchen diltiazem  (CARDIZEM) 30 MG tablet Take 30 mg by mouth daily as needed (Palpations).      . doxazosin (CARDURA) 4 MG tablet Take 12 mg by mouth at bedtime.       Marland Kitchen lisinopril (PRINIVIL,ZESTRIL) 40 MG tablet TAKE ONE TABLET DAILY.  30 tablet  3  . polyethylene glycol powder (MIRALAX) powder Take 17 g by mouth daily.  527 g  11  . pravastatin (PRAVACHOL) 40 MG tablet Take 40 mg by mouth daily.      . ranitidine (ZANTAC) 150 MG tablet Take 150 mg by mouth 2 (two) times daily.        . sitaGLIPtan-metformin (JANUMET) 50-1000 MG per tablet Take 1 tablet by mouth 2 (two) times daily with a meal.      . verapamil (VERELAN PM) 360 MG 24 hr capsule TAKE (1) CAPSULE BY MOUTH AT BEDTIME.  30 capsule  6  . warfarin (COUMADIN) 5 MG tablet Take 1 1/2 tablets daily  45 tablet  6  . Wheat Dextrin (BENEFIBER) POWD Take by mouth daily.      Marland Kitchen HYDROcodone-acetaminophen (NORCO) 5-325 MG per tablet Take 1 tablet by mouth every 4 (four) hours as needed for moderate pain.  40 tablet  0   No current facility-administered medications for this visit.    Allergies as of 02/24/2013  . (No Known Allergies)    Family History  Problem  Relation Age of Onset  . Heart disease Father   . Heart disease Mother   . Allergies      whole family  . Bronchitis Mother   . Colon cancer Neg Hx     History   Social History  . Marital Status: Married    Spouse Name: N/A    Number of Children: N/A  . Years of Education: N/A   Occupational History  . 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 Smoking status: Never Smoker   . Smokeless tobacco: Never Used  . Alcohol Use: No  . Drug Use: No  . Sexual Activity: None   Other Topics Concern  . None   Social History Narrative  . None    Review of Systems: Negative unless mentioned in HPI.   Physical Exam: BP 136/81  Pulse 53  Temp(Src) 97.6 F (36.4 C) (Oral)  Wt 226 lb 9.6 oz (102.785 kg) General:   Alert and oriented. No distress  noted. Pleasant and cooperative.  Head:  Normocephalic and atraumatic. Eyes:  Conjuctiva clear without scleral icterus. Mouth:  Oral mucosa pink and moist. Good dentition. No lesions. Neck:  Supple, without mass or thyromegaly. Heart:  S1, S2 present without murmurs, rubs, or gallops. Regular rate and rhythm. Abdomen:  +BS, soft, non-tender and non-distended. No rebound or guarding. Well-healing surgical incision Msk:  Symmetrical without gross deformities. Normal posture. Extremities:  Without edema. Neurologic:  Alert and  oriented x4;  grossly normal neurologically. Skin:  Intact without significant lesions or rashes. Psych:  Alert and cooperative. Normal mood and affect.

## 2013-02-25 ENCOUNTER — Encounter: Payer: Self-pay | Admitting: Gastroenterology

## 2013-02-25 NOTE — Assessment & Plan Note (Signed)
Doing well with Miralax and Benefiber. No rectal bleeding, no upper GI symptoms of note. Continue current regimen. Consider Linzess or Amitiza if Miralax fails to provide long-term good results. Patient does not desire to change right now.   Return in 6 months Colonoscopy in 2019

## 2013-02-26 NOTE — Progress Notes (Signed)
CC'd to pcp 

## 2013-03-04 ENCOUNTER — Telehealth: Payer: Self-pay | Admitting: *Deleted

## 2013-03-04 NOTE — Telephone Encounter (Signed)
Pt wife was calling to let us know that he has been given some new medications and was told to call us by PCP to see if his coumadin needed to be adjusted/tmj

## 2013-03-04 NOTE — Telephone Encounter (Signed)
Starting on Zpack and hydromet cough syrup today.  Told pt to take coumadin 1 tablet tonight then resume 1 1/2 tablets daily except 1 tablet on Wednesdays and come for INR appt on Monday 03/10/13 as scheduled.  Pt to eat extra greens through weekend.

## 2013-03-10 ENCOUNTER — Ambulatory Visit (INDEPENDENT_AMBULATORY_CARE_PROVIDER_SITE_OTHER): Payer: Medicare Other | Admitting: *Deleted

## 2013-03-10 DIAGNOSIS — I480 Paroxysmal atrial fibrillation: Secondary | ICD-10-CM

## 2013-03-10 DIAGNOSIS — I4891 Unspecified atrial fibrillation: Secondary | ICD-10-CM

## 2013-03-10 DIAGNOSIS — Z7901 Long term (current) use of anticoagulants: Secondary | ICD-10-CM

## 2013-03-10 LAB — POCT INR: INR: 2.4

## 2013-03-11 ENCOUNTER — Telehealth: Payer: Self-pay | Admitting: *Deleted

## 2013-03-11 NOTE — Telephone Encounter (Signed)
Pt started on amoxicillin 500mg  bid today x 10 days.  Told pt to continue current dose of coumadin, eat extra greens this week and come for INR check on 03/19/13.  Wife verbalized understanding and agreement.

## 2013-03-11 NOTE — Telephone Encounter (Signed)
Patient was started on Amoxicillin today and will be on it for 10 days / tgs

## 2013-03-20 ENCOUNTER — Ambulatory Visit (INDEPENDENT_AMBULATORY_CARE_PROVIDER_SITE_OTHER): Payer: Medicare Other | Admitting: *Deleted

## 2013-03-20 DIAGNOSIS — Z7901 Long term (current) use of anticoagulants: Secondary | ICD-10-CM

## 2013-03-20 DIAGNOSIS — I480 Paroxysmal atrial fibrillation: Secondary | ICD-10-CM

## 2013-03-20 DIAGNOSIS — I4891 Unspecified atrial fibrillation: Secondary | ICD-10-CM

## 2013-03-20 LAB — POCT INR: INR: 3.1

## 2013-04-10 ENCOUNTER — Ambulatory Visit (INDEPENDENT_AMBULATORY_CARE_PROVIDER_SITE_OTHER): Payer: Medicare Other | Admitting: *Deleted

## 2013-04-10 DIAGNOSIS — I480 Paroxysmal atrial fibrillation: Secondary | ICD-10-CM

## 2013-04-10 DIAGNOSIS — Z5181 Encounter for therapeutic drug level monitoring: Secondary | ICD-10-CM | POA: Insufficient documentation

## 2013-04-10 DIAGNOSIS — Z7901 Long term (current) use of anticoagulants: Secondary | ICD-10-CM

## 2013-04-10 DIAGNOSIS — I4891 Unspecified atrial fibrillation: Secondary | ICD-10-CM

## 2013-04-10 LAB — POCT INR: INR: 2.8

## 2013-05-08 ENCOUNTER — Ambulatory Visit (INDEPENDENT_AMBULATORY_CARE_PROVIDER_SITE_OTHER): Payer: Medicare Other | Admitting: *Deleted

## 2013-05-08 DIAGNOSIS — Z7901 Long term (current) use of anticoagulants: Secondary | ICD-10-CM

## 2013-05-08 DIAGNOSIS — Z5181 Encounter for therapeutic drug level monitoring: Secondary | ICD-10-CM

## 2013-05-08 DIAGNOSIS — I4891 Unspecified atrial fibrillation: Secondary | ICD-10-CM

## 2013-05-08 DIAGNOSIS — I480 Paroxysmal atrial fibrillation: Secondary | ICD-10-CM

## 2013-05-08 LAB — POCT INR: INR: 3.1

## 2013-05-16 ENCOUNTER — Ambulatory Visit: Payer: Medicare Other | Admitting: Cardiovascular Disease

## 2013-06-09 ENCOUNTER — Other Ambulatory Visit: Payer: Self-pay | Admitting: Cardiovascular Disease

## 2013-06-16 ENCOUNTER — Ambulatory Visit (INDEPENDENT_AMBULATORY_CARE_PROVIDER_SITE_OTHER): Payer: Medicare Other | Admitting: *Deleted

## 2013-06-16 ENCOUNTER — Encounter: Payer: Self-pay | Admitting: Cardiovascular Disease

## 2013-06-16 ENCOUNTER — Ambulatory Visit (INDEPENDENT_AMBULATORY_CARE_PROVIDER_SITE_OTHER): Payer: Medicare Other | Admitting: Cardiovascular Disease

## 2013-06-16 VITALS — BP 144/73 | HR 56 | Ht 73.0 in | Wt 222.0 lb

## 2013-06-16 DIAGNOSIS — I10 Essential (primary) hypertension: Secondary | ICD-10-CM

## 2013-06-16 DIAGNOSIS — Z7901 Long term (current) use of anticoagulants: Secondary | ICD-10-CM

## 2013-06-16 DIAGNOSIS — I480 Paroxysmal atrial fibrillation: Secondary | ICD-10-CM

## 2013-06-16 DIAGNOSIS — G4733 Obstructive sleep apnea (adult) (pediatric): Secondary | ICD-10-CM

## 2013-06-16 DIAGNOSIS — Z5181 Encounter for therapeutic drug level monitoring: Secondary | ICD-10-CM

## 2013-06-16 DIAGNOSIS — I4891 Unspecified atrial fibrillation: Secondary | ICD-10-CM

## 2013-06-16 DIAGNOSIS — E785 Hyperlipidemia, unspecified: Secondary | ICD-10-CM

## 2013-06-16 LAB — POCT INR: INR: 3.2

## 2013-06-16 NOTE — Progress Notes (Signed)
Patient ID: Riley [name], male   DOB: [birth year], 68 y.o.   MRN: [id number]      SUBJECTIVE: The patient is a 68 year old male with a history of paroxysmal atrial fibrillation for which he takes warfarin, hypertension, hyperlipidemia, and sleep apnea for which he is on CPAP.  Has been feeling very well and denies chest pain, shortness of breath, lightheadedness, dizziness, leg swelling and syncope. He very seldom experiences palpitations and these resolved within seconds. He has had no bleeding problems on warfarin. An ECG in the office today shows sinus bradycardia, 55 beats per minute, incomplete right bundle branch block with a QRS duration of 108 ms, and a nonspecific ST segment and T wave abnormality.  No Known Allergies  Current Outpatient Prescriptions  Medication Sig Dispense Refill  . acebutolol (SECTRAL) 200 MG capsule Take 1 capsule (200 mg total) by mouth daily.  30 capsule  6  . chlorthalidone (HYGROTON) 25 MG tablet Take 12.5 mg by mouth daily.      Marland Kitchen diltiazem (CARDIZEM) 30 MG tablet Take 30 mg by mouth daily as needed (Palpations).      . doxazosin (CARDURA) 4 MG tablet Take 12 mg by mouth at bedtime.       Marland Kitchen HYDROcodone-acetaminophen (NORCO) 5-325 MG per tablet Take 1 tablet by mouth every 4 (four) hours as needed for moderate pain.  40 tablet  0  . lisinopril (PRINIVIL,ZESTRIL) 40 MG tablet TAKE ONE TABLET DAILY.  30 tablet  0  . polyethylene glycol powder (MIRALAX) powder Take 17 g by mouth daily.  527 g  11  . pravastatin (PRAVACHOL) 40 MG tablet Take 40 mg by mouth daily.      . ranitidine (ZANTAC) 150 MG tablet Take 150 mg by mouth 2 (two) times daily.        . sitaGLIPtan-metformin (JANUMET) 50-1000 MG per tablet Take 1 tablet by mouth 2 (two) times daily with a meal.      . verapamil (VERELAN PM) 360 MG 24 hr capsule TAKE (1) CAPSULE BY MOUTH AT BEDTIME.  30 capsule  6  . warfarin (COUMADIN) 5 MG tablet Take 1 1/2 tablets daily  45 tablet  6  . Wheat Dextrin  (BENEFIBER) POWD Take by mouth daily.       No current facility-administered medications for this visit.    Past Medical History  Diagnosis Date  . Paroxysmal atrial fibrillation     12/2005; sinus bradycardia secondary to medications  . Chronic anticoagulation   . Hypertension     normal coronary angiography and normal EF in 12/98  . Diabetes mellitus, type 2     no insulin  . Chronic back pain   . Nodule of left lung     Granuloma in left upper lobe  . Edema   . Hyperlipidemia   . Sleep apnea   . GERD (gastroesophageal reflux disease)     Past Surgical History  Procedure Laterality Date  . Appendectomy  1998  . Tonsillectomy      as a child  . Colonoscopy  Never  . Colonoscopy N/A 11/26/2012    HQI:ONGEX 2 hemorrhoids. Pancolonic diverticulosis. Colonic polyps removed as described above. One hemostasis clip applied. TUBULAR ADENOMA. Surveillance Sept 2019.   Marland Kitchen Insertion of mesh N/A 01/27/2013    Procedure: INSERTION OF MESH;  Surgeon: Jamesetta So, MD;  Location: [facility];  Service: General;  Laterality: N/A;  . Umbilical hernia repair N/A 01/27/2013    Procedure: HERNIA REPAIR UMBILICAL  ADULT;  Surgeon: Jamesetta So, MD;  Location: [facility];  Service: General;  Laterality: N/A;    History   Social History  . Marital Status: Married    Spouse Name: N/A    Number of Children: N/A  . Years of Education: N/A   Occupational History  . 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 Smoking status: Never Smoker   . Smokeless tobacco: Never Used  . Alcohol Use: No  . Drug Use: No  . Sexual Activity: Not on file   Other Topics Concern  . Not on file   Social History Narrative  . No narrative on file     Filed Vitals:   06/16/13 0846  BP: 144/73  Pulse: 56  Height: 6\' 1"  (1.854 m)  Weight: 222 lb (100.699 kg)    PHYSICAL EXAM General: NAD Neck: No JVD, no thyromegaly. Lungs: Clear to auscultation bilaterally with  normal respiratory effort. CV: Nondisplaced PMI.  Regular rate and rhythm, normal S1/S2, no S3/S4, no murmur. No pretibial or periankle edema.  No carotid bruit.  Normal pedal pulses.  Abdomen: Soft, nontender, no hepatosplenomegaly, no distention.  Neurologic: Alert and oriented x 3.  Psych: Normal affect. Extremities: No clubbing or cyanosis.   ECG: reviewed and available in electronic records.      ASSESSMENT AND PLAN:on  1. Paroxysmal atrial fibrillation: He is doing well from the standpoint. He is tolerating warfarin. I did discuss with him alternative forms of anticoagulation with the newer target specific oral anticoagulants. He will consider this and get back to me. 2. HTN: Controlled on present therapy. 3. Hyperlipidemia: On pravastatin. 4. Sleep apnea: Using CPAP.  Dispo: f/u 6 months.  Kate Sable, M.D., F.A.C.C.

## 2013-06-16 NOTE — Patient Instructions (Signed)
Your physician wants you to follow-up in: 6 months You will receive a reminder letter in the mail two months in advance. If you don't receive a letter, please call our office to schedule the follow-up appointment.     Your physician recommends that you continue on your current medications as directed. Please refer to the Current Medication list given to you today.      Thank you for choosing [facility] !        

## 2013-07-01 ENCOUNTER — Other Ambulatory Visit: Payer: Self-pay | Admitting: Cardiovascular Disease

## 2013-07-07 ENCOUNTER — Ambulatory Visit (INDEPENDENT_AMBULATORY_CARE_PROVIDER_SITE_OTHER): Payer: Medicare Other | Admitting: *Deleted

## 2013-07-07 DIAGNOSIS — Z5181 Encounter for therapeutic drug level monitoring: Secondary | ICD-10-CM

## 2013-07-07 DIAGNOSIS — I4891 Unspecified atrial fibrillation: Secondary | ICD-10-CM

## 2013-07-07 DIAGNOSIS — Z7901 Long term (current) use of anticoagulants: Secondary | ICD-10-CM

## 2013-07-07 DIAGNOSIS — I480 Paroxysmal atrial fibrillation: Secondary | ICD-10-CM

## 2013-07-07 LAB — POCT INR: INR: 2.6

## 2013-07-09 ENCOUNTER — Other Ambulatory Visit: Payer: Self-pay | Admitting: Cardiovascular Disease

## 2013-07-09 ENCOUNTER — Telehealth: Payer: Self-pay | Admitting: Cardiovascular Disease

## 2013-07-09 MED ORDER — LISINOPRIL 40 MG PO TABS: ORAL_TABLET | ORAL | Status: DC

## 2013-07-09 NOTE — Telephone Encounter (Signed)
Medication sent via escribe.  

## 2013-07-09 NOTE — Telephone Encounter (Signed)
Please refill Lisinopril / tgs

## 2013-07-21 ENCOUNTER — Other Ambulatory Visit: Payer: Self-pay | Admitting: Cardiology

## 2013-08-04 ENCOUNTER — Other Ambulatory Visit: Payer: Self-pay | Admitting: Cardiovascular Disease

## 2013-08-04 ENCOUNTER — Ambulatory Visit (INDEPENDENT_AMBULATORY_CARE_PROVIDER_SITE_OTHER): Payer: Medicare Other | Admitting: *Deleted

## 2013-08-04 DIAGNOSIS — Z7901 Long term (current) use of anticoagulants: Secondary | ICD-10-CM

## 2013-08-04 DIAGNOSIS — I480 Paroxysmal atrial fibrillation: Secondary | ICD-10-CM

## 2013-08-04 DIAGNOSIS — I4891 Unspecified atrial fibrillation: Secondary | ICD-10-CM

## 2013-08-04 DIAGNOSIS — Z5181 Encounter for therapeutic drug level monitoring: Secondary | ICD-10-CM

## 2013-08-04 LAB — POCT INR: INR: 3.3

## 2013-08-25 ENCOUNTER — Ambulatory Visit (INDEPENDENT_AMBULATORY_CARE_PROVIDER_SITE_OTHER): Payer: Medicare Other | Admitting: *Deleted

## 2013-08-25 DIAGNOSIS — Z7901 Long term (current) use of anticoagulants: Secondary | ICD-10-CM

## 2013-08-25 DIAGNOSIS — Z5181 Encounter for therapeutic drug level monitoring: Secondary | ICD-10-CM

## 2013-08-25 DIAGNOSIS — I4891 Unspecified atrial fibrillation: Secondary | ICD-10-CM

## 2013-08-25 DIAGNOSIS — I480 Paroxysmal atrial fibrillation: Secondary | ICD-10-CM

## 2013-08-25 LAB — POCT INR: INR: 3.3

## 2013-08-27 ENCOUNTER — Encounter: Payer: Self-pay | Admitting: Internal Medicine

## 2013-09-03 ENCOUNTER — Telehealth: Payer: Self-pay | Admitting: *Deleted

## 2013-09-03 ENCOUNTER — Other Ambulatory Visit: Payer: Self-pay | Admitting: *Deleted

## 2013-09-03 NOTE — Telephone Encounter (Signed)
PT can not get verapamil from any surrounding pharmacies, they are out. Pt wife is calling to see what we would like for them to do.

## 2013-09-03 NOTE — Telephone Encounter (Signed)
Riley [name] out of Verapamil did not know when they would receive it again. [facility] in [place] they had the 180 mg had them send it to patients home. Notified pt they understand to take 2 180mg  at bedtime. Gave pt [facility] phone number to make payment arrangements. Pt understood instructions for medication and was happy to get meds.

## 2013-09-15 ENCOUNTER — Ambulatory Visit (INDEPENDENT_AMBULATORY_CARE_PROVIDER_SITE_OTHER): Payer: Medicare Other | Admitting: *Deleted

## 2013-09-15 DIAGNOSIS — I480 Paroxysmal atrial fibrillation: Secondary | ICD-10-CM

## 2013-09-15 DIAGNOSIS — Z7901 Long term (current) use of anticoagulants: Secondary | ICD-10-CM

## 2013-09-15 DIAGNOSIS — Z5181 Encounter for therapeutic drug level monitoring: Secondary | ICD-10-CM

## 2013-09-15 DIAGNOSIS — I4891 Unspecified atrial fibrillation: Secondary | ICD-10-CM

## 2013-09-15 LAB — POCT INR: INR: 2.3

## 2013-10-01 ENCOUNTER — Telehealth: Payer: Self-pay | Admitting: Cardiovascular Disease

## 2013-10-01 MED ORDER — CHLORTHALIDONE 25 MG PO TABS: 12.5000 mg | ORAL_TABLET | Freq: Every day | ORAL | Status: DC

## 2013-10-01 NOTE — Telephone Encounter (Signed)
Refill on chlorthalidone to [facility] / tgs

## 2013-10-01 NOTE — Telephone Encounter (Signed)
Pt of Dr.Koneswaran,refill request complete

## 2013-10-13 ENCOUNTER — Ambulatory Visit (INDEPENDENT_AMBULATORY_CARE_PROVIDER_SITE_OTHER): Payer: Medicare Other | Admitting: *Deleted

## 2013-10-13 DIAGNOSIS — Z5181 Encounter for therapeutic drug level monitoring: Secondary | ICD-10-CM

## 2013-10-13 DIAGNOSIS — Z7901 Long term (current) use of anticoagulants: Secondary | ICD-10-CM

## 2013-10-13 DIAGNOSIS — I480 Paroxysmal atrial fibrillation: Secondary | ICD-10-CM

## 2013-10-13 DIAGNOSIS — I4891 Unspecified atrial fibrillation: Secondary | ICD-10-CM

## 2013-10-13 LAB — POCT INR: INR: 2.2

## 2013-11-12 ENCOUNTER — Ambulatory Visit (INDEPENDENT_AMBULATORY_CARE_PROVIDER_SITE_OTHER): Payer: Medicare Other | Admitting: *Deleted

## 2013-11-12 DIAGNOSIS — I4891 Unspecified atrial fibrillation: Secondary | ICD-10-CM

## 2013-11-12 DIAGNOSIS — Z7901 Long term (current) use of anticoagulants: Secondary | ICD-10-CM

## 2013-11-12 DIAGNOSIS — I480 Paroxysmal atrial fibrillation: Secondary | ICD-10-CM

## 2013-11-12 DIAGNOSIS — Z5181 Encounter for therapeutic drug level monitoring: Secondary | ICD-10-CM

## 2013-11-12 LAB — POCT INR: INR: 2.6

## 2013-11-13 ENCOUNTER — Other Ambulatory Visit: Payer: Self-pay | Admitting: Cardiovascular Disease

## 2013-12-02 ENCOUNTER — Telehealth: Payer: Self-pay | Admitting: Cardiovascular Disease

## 2013-12-02 MED ORDER — LISINOPRIL 40 MG PO TABS: ORAL_TABLET | ORAL | Status: DC

## 2013-12-02 NOTE — Telephone Encounter (Signed)
Received fax refill request  Rx # [id number] Medication:  Lisinopril 40 mg tablets Qty 30 Sig:  Take one tablet by mouth once daily Physician:  Bronson Ing  Pt's insurance requesting 90 day supply. Please advise if deem appropriate. / tgs

## 2013-12-02 NOTE — Telephone Encounter (Signed)
Medication sent to pharmacy  

## 2013-12-23 ENCOUNTER — Telehealth: Payer: Self-pay | Admitting: Internal Medicine

## 2013-12-23 NOTE — Telephone Encounter (Signed)
Routing to refill box  

## 2013-12-23 NOTE — Telephone Encounter (Signed)
Pt is aware of OV with RMR on 11/18 and wanted to know if he could get a refill of Miralax to hold him until his OV. He uses [facility]. Please advise and call [phone]

## 2013-12-24 ENCOUNTER — Ambulatory Visit (INDEPENDENT_AMBULATORY_CARE_PROVIDER_SITE_OTHER): Payer: Medicare Other | Admitting: Cardiovascular Disease

## 2013-12-24 ENCOUNTER — Encounter: Payer: Self-pay | Admitting: Cardiovascular Disease

## 2013-12-24 ENCOUNTER — Ambulatory Visit (INDEPENDENT_AMBULATORY_CARE_PROVIDER_SITE_OTHER): Payer: Medicare Other | Admitting: *Deleted

## 2013-12-24 VITALS — BP 150/98 | HR 54 | Ht 73.0 in | Wt 223.0 lb

## 2013-12-24 DIAGNOSIS — I480 Paroxysmal atrial fibrillation: Secondary | ICD-10-CM

## 2013-12-24 DIAGNOSIS — I10 Essential (primary) hypertension: Secondary | ICD-10-CM

## 2013-12-24 DIAGNOSIS — Z7901 Long term (current) use of anticoagulants: Secondary | ICD-10-CM

## 2013-12-24 DIAGNOSIS — I4891 Unspecified atrial fibrillation: Secondary | ICD-10-CM

## 2013-12-24 DIAGNOSIS — Z5181 Encounter for therapeutic drug level monitoring: Secondary | ICD-10-CM

## 2013-12-24 DIAGNOSIS — E785 Hyperlipidemia, unspecified: Secondary | ICD-10-CM

## 2013-12-24 DIAGNOSIS — G4733 Obstructive sleep apnea (adult) (pediatric): Secondary | ICD-10-CM

## 2013-12-24 LAB — POCT INR: INR: 2.4

## 2013-12-24 MED ORDER — DILTIAZEM HCL 30 MG PO TABS: 30.0000 mg | ORAL_TABLET | Freq: Every day | ORAL | Status: DC | PRN

## 2013-12-24 NOTE — Progress Notes (Signed)
Patient ID: Riley [name], male   DOB: [birth year], 68 y.o.   MRN: [id number]      SUBJECTIVE: The patient is a 68 year old male with a history of paroxysmal atrial fibrillation for which he takes warfarin, hypertension, hyperlipidemia, and sleep apnea for which he is on CPAP. He has been doing very well and denies chest pain, dizziness, and shortness of breath. He very seldom has palpitations and has only had to take a prn diltiazem tablet twice in the past two years.   Review of Systems: As per "subjective", otherwise negative.  No Known Allergies  Current Outpatient Prescriptions  Medication Sig Dispense Refill  . acebutolol (SECTRAL) 200 MG capsule TAKE (1) CAPSULE BY MOUTH DAILY.      . chlorthalidone (HYGROTON) 25 MG tablet Take 0.5 tablets (12.5 mg total) by mouth daily.  45 tablet  3  . diltiazem (CARDIZEM) 30 MG tablet Take 30 mg by mouth daily as needed (Palpations).      . doxazosin (CARDURA) 4 MG tablet Take 12 mg by mouth at bedtime.       Marland Kitchen HYDROcodone-acetaminophen (NORCO) 5-325 MG per tablet Take 1 tablet by mouth every 4 (four) hours as needed for moderate pain.  40 tablet  0  . lisinopril (PRINIVIL,ZESTRIL) 40 MG tablet TAKE ONE TABLET DAILY.  90 tablet  0  . polyethylene glycol powder (MIRALAX) powder Take 17 g by mouth daily.  527 g  11  . pravastatin (PRAVACHOL) 40 MG tablet Take 40 mg by mouth daily.      . ranitidine (ZANTAC) 150 MG tablet Take 150 mg by mouth 2 (two) times daily.        . sitaGLIPtan-metformin (JANUMET) 50-1000 MG per tablet Take 1 tablet by mouth 2 (two) times daily with a meal.      . verapamil (COVERA HS) 180 MG (CO) 24 hr tablet Take 180 mg by mouth daily. Take 2 tabs (180 mg total) at bedtime      . warfarin (COUMADIN) 5 MG tablet Take 1 1/2 tablets daily except 1 tablet on Mondays and Thursdays  45 tablet  6  . Wheat Dextrin (BENEFIBER) POWD Take by mouth daily.       No current facility-administered medications for this visit.    Past  Medical History  Diagnosis Date  . Paroxysmal atrial fibrillation     12/2005; sinus bradycardia secondary to medications  . Chronic anticoagulation   . Hypertension     normal coronary angiography and normal EF in 12/98  . Diabetes mellitus, type 2     no insulin  . Chronic back pain   . Nodule of left lung     Granuloma in left upper lobe  . Edema   . Hyperlipidemia   . Sleep apnea   . GERD (gastroesophageal reflux disease)     Past Surgical History  Procedure Laterality Date  . Appendectomy  1998  . Tonsillectomy      as a child  . Colonoscopy  Never  . Colonoscopy N/A 11/26/2012    RKY:HCWCB 2 hemorrhoids. Pancolonic diverticulosis. Colonic polyps removed as described above. One hemostasis clip applied. TUBULAR ADENOMA. Surveillance Sept 2019.   Marland Kitchen Insertion of mesh N/A 01/27/2013    Procedure: INSERTION OF MESH;  Surgeon: Jamesetta So, MD;  Location: [facility];  Service: General;  Laterality: N/A;  . Umbilical hernia repair N/A 01/27/2013    Procedure: HERNIA REPAIR UMBILICAL ADULT;  Surgeon: Jamesetta So, MD;  Location: [facility];  Service: General;  Laterality: N/A;    History   Social History  . Marital Status: Married    Spouse Name: N/A    Number of Children: N/A  . Years of Education: N/A   Occupational History  . 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 Smoking status: Never Smoker   . Smokeless tobacco: Never Used  . Alcohol Use: No  . Drug Use: No  . Sexual Activity: Not on file   Other Topics Concern  . Not on file   Social History Narrative  . No narrative on file     Filed Vitals:   12/24/13 0810  BP: 150/98  Pulse: 54  Height: 6\' 1"  (1.854 m)  Weight: 223 lb (101.152 kg)    PHYSICAL EXAM General: NAD HEENT: Normal. Neck: No JVD, no thyromegaly. Lungs: Clear to auscultation bilaterally with normal respiratory effort. CV: Nondisplaced PMI.  Regular rate and rhythm, normal S1/S2, no S3/S4, no  murmur. No pretibial or periankle edema.  No carotid bruit.  Normal pedal pulses.  Abdomen: Soft, nontender, no hepatosplenomegaly, no distention.  Neurologic: Alert and oriented x 3.  Psych: Normal affect. Skin: Normal. Musculoskeletal: Normal range of motion, no gross deformities. Extremities: No clubbing or cyanosis.   ECG: Most recent ECG reviewed.      ASSESSMENT AND PLAN: 1. Paroxysmal atrial fibrillation: Stable and maintaining a regular rhythm. He is tolerating warfarin without bleeding complications. I previously discussed alternative forms of anticoagulation with the newer target specific oral anticoagulants, but he preferred to stay on warfarin. Will refill prn diltiazem. 2. Essential HTN: Elevated on present therapy. However, he blames this on not having taken his meds this morning. No changes. 3. Hyperlipidemia: On pravastatin.  4. Sleep apnea: Using CPAP.   Dispo: f/u 1 year.  Kate Sable, M.D., F.A.C.C.

## 2013-12-24 NOTE — Patient Instructions (Signed)
Your physician wants you to follow-up in: 1 year with Dr. Virgina Jock will receive a reminder letter in the mail two months in advance. If you don't receive a letter, please call our office to schedule the follow-up appointment.  Your physician recommends that you continue on your current medications as directed. Please refer to the Current Medication list given to you today.  I have refilled your Cardizem   Thank you for choosing [facility]!!

## 2013-12-25 MED ORDER — POLYETHYLENE GLYCOL 3350 17 GM/SCOOP PO POWD
17.0000 g | Freq: Every day | ORAL | Status: DC
Start: 2013-12-25 — End: 2015-01-25

## 2013-12-25 NOTE — Addendum Note (Signed)
Addended by: Mahala Menghini on: 12/25/2013 10:07 PM   Modules accepted: Orders

## 2013-12-25 NOTE — Telephone Encounter (Signed)
done

## 2014-01-21 ENCOUNTER — Encounter: Payer: Self-pay | Admitting: Internal Medicine

## 2014-01-21 ENCOUNTER — Ambulatory Visit (INDEPENDENT_AMBULATORY_CARE_PROVIDER_SITE_OTHER): Payer: Medicare Other | Admitting: Internal Medicine

## 2014-01-21 VITALS — BP 159/86 | HR 49 | Temp 98.0°F | Ht 73.0 in | Wt 223.0 lb

## 2014-01-21 DIAGNOSIS — K5909 Other constipation: Secondary | ICD-10-CM

## 2014-01-21 NOTE — Progress Notes (Signed)
Primary Care Physician:  Glo Herring., MD Primary Gastroenterologist:  Dr. Gala Romney  Pre-Procedure History & Physical: HPI:  Riley [name] is a 68 y.o. male here for followup chronic constipation. Doing very well on MiraLax and Benefiber daily. Has a bowel movement almost every day. He is very pleased with his progress. Hasn't passed any blood. Has only occasional reflux which takes the Zantac now and then. History of colonic adenoma removed previously; due for surveillance colonoscopy 2019.  Past Medical History  Diagnosis Date  . Paroxysmal atrial fibrillation     12/2005; sinus bradycardia secondary to medications  . Chronic anticoagulation   . Hypertension     normal coronary angiography and normal EF in 12/98  . Diabetes mellitus, type 2     no insulin  . Chronic back pain   . Nodule of left lung     Granuloma in left upper lobe  . Edema   . Hyperlipidemia   . Sleep apnea   . GERD (gastroesophageal reflux disease)     Past Surgical History  Procedure Laterality Date  . Appendectomy  1998  . Tonsillectomy      as a child  . Colonoscopy  Never  . Colonoscopy N/A 11/26/2012    EXN:TZGYF 2 hemorrhoids. Pancolonic diverticulosis. Colonic polyps removed as described above. One hemostasis clip applied. TUBULAR ADENOMA. Surveillance Sept 2019.   Marland Kitchen Insertion of mesh N/A 01/27/2013    Procedure: INSERTION OF MESH;  Surgeon: Jamesetta So, MD;  Location: [facility];  Service: General;  Laterality: N/A;  . Umbilical hernia repair N/A 01/27/2013    Procedure: HERNIA REPAIR UMBILICAL ADULT;  Surgeon: Jamesetta So, MD;  Location: [facility];  Service: General;  Laterality: N/A;    Prior to Admission medications   Medication Sig Start Date End Date Taking? Authorizing Provider  acebutolol (SECTRAL) 200 MG capsule TAKE (1) CAPSULE BY MOUTH DAILY. 07/21/13  Yes Herminio Commons, MD  chlorthalidone (HYGROTON) 25 MG tablet Take 0.5 tablets (12.5 mg total) by mouth daily. 10/01/13  Yes  Herminio Commons, MD  diltiazem (CARDIZEM) 30 MG tablet Take 1 tablet (30 mg total) by mouth daily as needed (Palpations). 12/24/13  Yes Herminio Commons, MD  lisinopril (PRINIVIL,ZESTRIL) 40 MG tablet TAKE ONE TABLET DAILY. 12/02/13  Yes Herminio Commons, MD  polyethylene glycol powder (MIRALAX) powder Take 17 g by mouth daily. 12/25/13  Yes Mahala Menghini, PA-C  pravastatin (PRAVACHOL) 40 MG tablet Take 40 mg by mouth daily.   Yes Yehuda Savannah, MD  ranitidine (ZANTAC) 150 MG tablet Take 150 mg by mouth 2 (two) times daily.     Yes Historical Provider, MD  sitaGLIPtan-metformin (JANUMET) 50-1000 MG per tablet Take 1 tablet by mouth 2 (two) times daily with a meal.   Yes Historical Provider, MD  verapamil (COVERA HS) 180 MG (CO) 24 hr tablet Take 180 mg by mouth daily. Take 2 tabs (180 mg total) at bedtime   Yes Historical Provider, MD  warfarin (COUMADIN) 5 MG tablet Take 1 1/2 tablets daily except 1 tablet on Mondays and Thursdays 08/04/13  Yes Herminio Commons, MD  Wheat Dextrin (BENEFIBER) POWD Take by mouth daily.   Yes Historical Provider, MD  doxazosin (CARDURA) 4 MG tablet Take 12 mg by mouth at bedtime.     Historical Provider, MD  HYDROcodone-acetaminophen (NORCO) 5-325 MG per tablet Take 1 tablet by mouth every 4 (four) hours as needed for moderate pain. 01/27/13  Jamesetta So, MD    Allergies as of 01/21/2014  . (No Known Allergies)    Family History  Problem Relation Age of Onset  . Heart disease Father   . Heart disease Mother   . Allergies      whole family  . Bronchitis Mother   . Colon cancer Neg Hx     History   Social History  . Marital Status: Married    Spouse Name: N/A    Number of Children: N/A  . Years of Education: N/A   Occupational History  . 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 Smoking status: Never Smoker   . Smokeless tobacco: Never Used  . Alcohol Use: No  . Drug Use: No  . Sexual  Activity: Not on file   Other Topics Concern  . Not on file   Social History Narrative    Review of Systems: See HPI, otherwise negative ROS  Physical Exam: BP 159/86 mmHg  Pulse 49  Temp(Src) 98 F (36.7 C) (Oral)  Ht 6\' 1"  (1.854 m)  Wt 223 lb (101.152 kg)  BMI 29.43 kg/m2 General:   Alert,  Well-developed, well-nourished, pleasant and cooperative in NAD Skin:  Intact without significant lesions or rashes. Eyes:  Sclera clear, no icterus.   Conjunctiva pink. Ears:  Normal auditory acuity. Nose:  No deformity, discharge,  or lesions. Mouth:  No deformity or lesions. Neck:  Supple; no masses or thyromegaly. No significant cervical adenopathy. Lungs:  Clear throughout to auscultation.   No wheezes, crackles, or rhonchi. No acute distress. Heart:  Regular rate and rhythm; no murmurs, clicks, rubs,  or gallops. Abdomen: Non-distended, normal bowel sounds.  Soft and nontender without appreciable mass or hepatosplenomegaly.  Pulses:  Normal pulses noted. Extremities:  Without clubbing or edema.  Impression:   Pleasant 68 year old gentleman chronic constipation now well managed with MiraLax and Benefiber he is quite pleased. History of colonic adenoma; due for surveillance colonoscopy 2019.    Recommendations:   Continue Miralax daily  Continue Benefiber daily  Office visit in 1 year  Colonoscopy 2019    Notice: This dictation was prepared with Dragon dictation along with smaller phrase technology. Any transcriptional errors that result from this process are unintentional and may not be corrected upon review.

## 2014-01-21 NOTE — Patient Instructions (Signed)
Continue Miralax daily  Continue Benefiber daily  Office visit in 1 year  Colonoscopy 2019

## 2014-01-22 ENCOUNTER — Ambulatory Visit (INDEPENDENT_AMBULATORY_CARE_PROVIDER_SITE_OTHER): Payer: Medicare Other | Admitting: Pulmonary Disease

## 2014-01-22 ENCOUNTER — Encounter: Payer: Self-pay | Admitting: Pulmonary Disease

## 2014-01-22 VITALS — BP 146/86 | HR 48 | Ht 73.0 in | Wt 227.0 lb

## 2014-01-22 DIAGNOSIS — G4733 Obstructive sleep apnea (adult) (pediatric): Secondary | ICD-10-CM

## 2014-01-22 NOTE — Patient Instructions (Signed)
CPAP supplies will be renewed x 1 year Trial of nasal mask with chin strap

## 2014-01-22 NOTE — Progress Notes (Signed)
   Subjective:    Patient ID: AMY BELLOSO, male    DOB: 11/16/1945, 68 y.o.   MRN: [id number]  HPI   68 year old never smoker with atrial fibrillation for FU of obstructive sleep apnea.  He had an admission for atrial fibrillation and was observed to have apneas and snoring. He is maintained on a regimen of Acebutalol and Cardizem as needed for palpitations to avoid ER visits. Takes Nasonex for nasal stuffiness.  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  09/2012 PSG >>He did not sleep well- TST 128 mins - only 2-3 hrs of sleep -RDI 37/h- stopped breathing 20 times/h  Started on autoCPAP 5-15 med FF mask  Download 08/28/12 >> AHI 6/h, usage 5h, avg pr 11 cm  Download 10/24/12 -CPAP 11 cm -Good usage ,AHI 6/h    01/22/2014  Chief Complaint  Patient presents with  . Follow-up    F/U OSA; pt likes to sleep on his side and the machine leaks when he turns to the side, air causing burning sensation in nose; uses CPAP approx 5 hours nightly.  having a lot of congestion, wants to know if he is using too much moisture, he cut back on the water he puts in the CPAP, having trouble with dry mouth   Pt reports he wears his CPAP everynight x 6-7 hrs a night.  He feels pretty good during the day. Got new pillow C/o mouth dryness Compliant with cpap Download 01/2014 - AHI 10/h on 11 cm, avg usage 4.5h, 50% usage   Review of Systems neg for any significant sore throat, dysphagia, itching, sneezing, nasal congestion or excess/ purulent secretions, fever, chills, sweats, unintended wt loss, pleuritic or exertional cp, hempoptysis, orthopnea pnd or change in chronic leg swelling. Also denies presyncope, palpitations, heartburn, abdominal pain, nausea, vomiting, diarrhea or change in bowel or urinary habits, dysuria,hematuria, rash, arthralgias, visual complaints, headache, numbness weakness or ataxia.     Objective:   Physical Exam  Gen. Pleasant, well-nourished, in no  distress ENT - no lesions, no post nasal drip Neck: No JVD, no thyromegaly, no carotid bruits Lungs: no use of accessory muscles, no dullness to percussion, clear without rales or rhonchi  Cardiovascular: Rhythm regular, heart sounds  normal, no murmurs or gallops, no peripheral edema Musculoskeletal: No deformities, no cyanosis or clubbing         Assessment & Plan:

## 2014-01-26 NOTE — Assessment & Plan Note (Signed)
CPAP supplies will be renewed x 1 year Trial of nasal mask with chin strap  Weight loss encouraged, compliance with goal of at least 4-6 hrs every night is the expectation. Advised against medications with sedative side effects Cautioned against driving when sleepy - understanding that sleepiness will vary on a day to day basis

## 2014-01-27 ENCOUNTER — Other Ambulatory Visit: Payer: Self-pay | Admitting: Cardiovascular Disease

## 2014-02-04 ENCOUNTER — Ambulatory Visit (INDEPENDENT_AMBULATORY_CARE_PROVIDER_SITE_OTHER): Payer: Medicare Other | Admitting: *Deleted

## 2014-02-04 DIAGNOSIS — I4891 Unspecified atrial fibrillation: Secondary | ICD-10-CM

## 2014-02-04 DIAGNOSIS — Z5181 Encounter for therapeutic drug level monitoring: Secondary | ICD-10-CM

## 2014-02-04 DIAGNOSIS — Z7901 Long term (current) use of anticoagulants: Secondary | ICD-10-CM

## 2014-02-04 DIAGNOSIS — I480 Paroxysmal atrial fibrillation: Secondary | ICD-10-CM

## 2014-02-04 LAB — POCT INR: INR: 2.5

## 2014-03-18 ENCOUNTER — Ambulatory Visit (INDEPENDENT_AMBULATORY_CARE_PROVIDER_SITE_OTHER): Payer: 59 | Admitting: *Deleted

## 2014-03-18 DIAGNOSIS — Z5181 Encounter for therapeutic drug level monitoring: Secondary | ICD-10-CM

## 2014-03-18 DIAGNOSIS — I4891 Unspecified atrial fibrillation: Secondary | ICD-10-CM

## 2014-03-18 DIAGNOSIS — Z7901 Long term (current) use of anticoagulants: Secondary | ICD-10-CM

## 2014-03-18 DIAGNOSIS — I480 Paroxysmal atrial fibrillation: Secondary | ICD-10-CM

## 2014-03-18 LAB — POCT INR: INR: 2.2

## 2014-04-02 ENCOUNTER — Other Ambulatory Visit: Payer: Self-pay | Admitting: Cardiovascular Disease

## 2014-04-02 NOTE — Telephone Encounter (Signed)
Refilled verapamil 04/02/14

## 2014-04-29 ENCOUNTER — Ambulatory Visit (INDEPENDENT_AMBULATORY_CARE_PROVIDER_SITE_OTHER): Payer: 59 | Admitting: *Deleted

## 2014-04-29 DIAGNOSIS — I4891 Unspecified atrial fibrillation: Secondary | ICD-10-CM

## 2014-04-29 DIAGNOSIS — Z5181 Encounter for therapeutic drug level monitoring: Secondary | ICD-10-CM

## 2014-04-29 DIAGNOSIS — I480 Paroxysmal atrial fibrillation: Secondary | ICD-10-CM

## 2014-04-29 DIAGNOSIS — Z7901 Long term (current) use of anticoagulants: Secondary | ICD-10-CM

## 2014-04-29 LAB — POCT INR: INR: 2.6

## 2014-05-25 ENCOUNTER — Other Ambulatory Visit: Payer: Self-pay | Admitting: Cardiovascular Disease

## 2014-06-10 ENCOUNTER — Ambulatory Visit (INDEPENDENT_AMBULATORY_CARE_PROVIDER_SITE_OTHER): Payer: Medicare Other | Admitting: *Deleted

## 2014-06-10 DIAGNOSIS — I4891 Unspecified atrial fibrillation: Secondary | ICD-10-CM | POA: Diagnosis not present

## 2014-06-10 DIAGNOSIS — Z7901 Long term (current) use of anticoagulants: Secondary | ICD-10-CM

## 2014-06-10 DIAGNOSIS — I480 Paroxysmal atrial fibrillation: Secondary | ICD-10-CM

## 2014-06-10 DIAGNOSIS — Z5181 Encounter for therapeutic drug level monitoring: Secondary | ICD-10-CM | POA: Diagnosis not present

## 2014-06-10 LAB — POCT INR: INR: 2.2

## 2014-07-22 ENCOUNTER — Ambulatory Visit (INDEPENDENT_AMBULATORY_CARE_PROVIDER_SITE_OTHER): Payer: Medicare Other | Admitting: *Deleted

## 2014-07-22 DIAGNOSIS — Z5181 Encounter for therapeutic drug level monitoring: Secondary | ICD-10-CM | POA: Diagnosis not present

## 2014-07-22 DIAGNOSIS — I4891 Unspecified atrial fibrillation: Secondary | ICD-10-CM | POA: Diagnosis not present

## 2014-07-22 DIAGNOSIS — I480 Paroxysmal atrial fibrillation: Secondary | ICD-10-CM | POA: Diagnosis not present

## 2014-07-22 DIAGNOSIS — Z7901 Long term (current) use of anticoagulants: Secondary | ICD-10-CM

## 2014-07-22 LAB — POCT INR: INR: 1.9

## 2014-08-19 ENCOUNTER — Ambulatory Visit (INDEPENDENT_AMBULATORY_CARE_PROVIDER_SITE_OTHER): Payer: Medicare Other | Admitting: *Deleted

## 2014-08-19 DIAGNOSIS — I480 Paroxysmal atrial fibrillation: Secondary | ICD-10-CM

## 2014-08-19 DIAGNOSIS — Z5181 Encounter for therapeutic drug level monitoring: Secondary | ICD-10-CM | POA: Diagnosis not present

## 2014-08-19 DIAGNOSIS — I4891 Unspecified atrial fibrillation: Secondary | ICD-10-CM

## 2014-08-19 DIAGNOSIS — Z7901 Long term (current) use of anticoagulants: Secondary | ICD-10-CM

## 2014-08-19 LAB — POCT INR: INR: 2.3

## 2014-09-14 ENCOUNTER — Other Ambulatory Visit: Payer: Self-pay | Admitting: Cardiovascular Disease

## 2014-09-21 ENCOUNTER — Other Ambulatory Visit: Payer: Self-pay | Admitting: Cardiovascular Disease

## 2014-09-23 ENCOUNTER — Ambulatory Visit (INDEPENDENT_AMBULATORY_CARE_PROVIDER_SITE_OTHER): Payer: Medicare Other | Admitting: *Deleted

## 2014-09-23 DIAGNOSIS — I480 Paroxysmal atrial fibrillation: Secondary | ICD-10-CM | POA: Diagnosis not present

## 2014-09-23 DIAGNOSIS — Z5181 Encounter for therapeutic drug level monitoring: Secondary | ICD-10-CM | POA: Diagnosis not present

## 2014-09-23 DIAGNOSIS — Z7901 Long term (current) use of anticoagulants: Secondary | ICD-10-CM

## 2014-09-23 DIAGNOSIS — I4891 Unspecified atrial fibrillation: Secondary | ICD-10-CM

## 2014-09-23 LAB — POCT INR: INR: 2.5

## 2014-09-24 ENCOUNTER — Telehealth: Payer: Self-pay

## 2014-09-24 ENCOUNTER — Telehealth: Payer: Self-pay | Admitting: Adult Health

## 2014-09-24 NOTE — Telephone Encounter (Signed)
Pt of Dr Bronson Ing, Pt had elevated HR around 120 ,took Cardizem 30 mg as directed for PAF 20 minutes ago and feels better, HR around 100.He will continue to watch HR and call us back

## 2014-09-24 NOTE — Telephone Encounter (Signed)
error 

## 2014-09-25 NOTE — Telephone Encounter (Signed)
Thanks for update.  Zandra Abts MD

## 2014-10-08 ENCOUNTER — Emergency Department (HOSPITAL_COMMUNITY): Payer: No Typology Code available for payment source

## 2014-10-08 ENCOUNTER — Encounter (HOSPITAL_COMMUNITY): Payer: Self-pay | Admitting: Emergency Medicine

## 2014-10-08 ENCOUNTER — Emergency Department (HOSPITAL_COMMUNITY)
Admission: EM | Admit: 2014-10-08 | Discharge: 2014-10-08 | Disposition: A | Discharge status: Home / Self Care | Payer: No Typology Code available for payment source | Attending: Emergency Medicine | Admitting: Emergency Medicine

## 2014-10-08 DIAGNOSIS — Y998 Other external cause status: Secondary | ICD-10-CM | POA: Insufficient documentation

## 2014-10-08 DIAGNOSIS — Z8669 Personal history of other diseases of the nervous system and sense organs: Secondary | ICD-10-CM | POA: Diagnosis not present

## 2014-10-08 DIAGNOSIS — Z79899 Other long term (current) drug therapy: Secondary | ICD-10-CM | POA: Diagnosis not present

## 2014-10-08 DIAGNOSIS — S29001A Unspecified injury of muscle and tendon of front wall of thorax, initial encounter: Secondary | ICD-10-CM | POA: Insufficient documentation

## 2014-10-08 DIAGNOSIS — E785 Hyperlipidemia, unspecified: Secondary | ICD-10-CM | POA: Diagnosis not present

## 2014-10-08 DIAGNOSIS — E119 Type 2 diabetes mellitus without complications: Secondary | ICD-10-CM | POA: Insufficient documentation

## 2014-10-08 DIAGNOSIS — G8929 Other chronic pain: Secondary | ICD-10-CM | POA: Insufficient documentation

## 2014-10-08 DIAGNOSIS — Z7901 Long term (current) use of anticoagulants: Secondary | ICD-10-CM | POA: Diagnosis not present

## 2014-10-08 DIAGNOSIS — I10 Essential (primary) hypertension: Secondary | ICD-10-CM | POA: Diagnosis not present

## 2014-10-08 DIAGNOSIS — Y9389 Activity, other specified: Secondary | ICD-10-CM | POA: Diagnosis not present

## 2014-10-08 DIAGNOSIS — K219 Gastro-esophageal reflux disease without esophagitis: Secondary | ICD-10-CM | POA: Insufficient documentation

## 2014-10-08 DIAGNOSIS — R0789 Other chest pain: Secondary | ICD-10-CM

## 2014-10-08 DIAGNOSIS — R52 Pain, unspecified: Secondary | ICD-10-CM

## 2014-10-08 DIAGNOSIS — Y92410 Unspecified street and highway as the place of occurrence of the external cause: Secondary | ICD-10-CM | POA: Insufficient documentation

## 2014-10-08 LAB — PROTIME-INR
INR: 2.14 — AB (ref 0.00–1.49)
Prothrombin Time: 23.7 seconds — ABNORMAL HIGH (ref 11.6–15.2)

## 2014-10-08 MED ORDER — HYDROCODONE-ACETAMINOPHEN 5-325 MG PO TABS: ORAL_TABLET | ORAL | Status: DC

## 2014-10-08 NOTE — Discharge Instructions (Signed)
°Emergency Department Resource Guide °1) Find a Doctor and Pay Out of Pocket °Although you won't have to find out who is covered by your insurance plan, it is a good idea to ask around and get recommendations. You will then need to call the office and see if the doctor you have chosen will accept you as a new patient and what types of options they offer for patients who are self-pay. Some doctors offer discounts or will set up payment plans for their patients who do not have insurance, but you will need to ask so you aren't surprised when you get to your appointment. ° °2) Contact Your Local Health Department °Not all health departments have doctors that can see patients for sick visits, but many do, so it is worth a call to see if yours does. If you don't know where your local health department is, you can check in your phone book. The CDC also has a tool to help you locate your state's health department, and many state websites also have listings of all of their local health departments. ° °3) Find a Walk-in Clinic °If your illness is not likely to be very severe or complicated, you may want to try a walk in clinic. These are popping up all over the country in pharmacies, drugstores, and shopping centers. They're usually staffed by nurse practitioners or physician assistants that have been trained to treat common illnesses and complaints. They're usually fairly quick and inexpensive. However, if you have serious medical issues or chronic medical problems, these are probably not your best option. ° °No Primary Care Doctor: °- Call [facility] at  832-8000 - they can help you locate a primary care doctor that  accepts your insurance, provides certain services, etc. °- Physician Referral Service- 1-800-533-3463 ° °Chronic Pain Problems: °Organization         Address  Phone   Notes  °[facility]  (336) 297-2271 Patients need to be referred by their primary care doctor.  ° °Medication  Assistance: °Organization         Address  Phone   Notes  °[place] [facility] [address] °[place], [place] [address] (336) 641-8030 --Must be a resident of [place] °-- Must have NO insurance coverage whatsoever (no Medicaid/ Medicare, etc.) °-- The pt. MUST have a primary care doctor that directs their care regularly and follows them in the community °  °[facility]  (866) 331-1348   °[employer]  (888) 892-1162   ° °Agencies that provide inexpensive medical care: °Organization         Address  Phone   Notes  °[facility]  (336) 832-8035   °[facility]    (336) 832-7272   °[facility] [address] °[place], [place] [address] (336) 832-4777   °[facility] [address], °[place] (336) 271-4999   °[facility]    (336) 373-0678   °[facility]    (336) 272-1050   °[facility] ° [address], [place] Phone:  (336) 832-4444, Fax:  (336) 832-4440 Hours of Operation:  9 am - 6 pm, M-F.  Also accepts Medicaid/Medicare and self-pay.  °[facility] ° [address], [place] Phone: (336) 832-3150, Fax: (336) [phone]. Hours of Operation:  8:30 am - 5:30 pm, M-F.  Also accepts Medicaid and self-pay.  °[facility] [place] [address], [place] Phone: (336) 878-6027   °[facility] [address], [place], [place] (336)723-1848, Ext. 123 Mondays & Thursdays: 7-9 AM.  First 15 patients are seen on a first come, first serve basis. °  ° °Medicaid-accepting [place] Providers: ° °Organization         Address  Phone   Notes  °[facility] [address], [place] (336) 641-2100 Also accepts self-pay patients.  °[facility] [address], [place] ° (336) 856-9996   °[facility] [address], [place]  (336) 288-8857   °[facility] [address] [place] [address], [place] (336) 299-7000   °Veita Bland [address], [place]  ° (336) 373-1557 Only accepts  Access Medicaid patients after they have their name applied to their card.  ° °Self-Pay (no insurance) in [place]: ° °Organization         Address  Phone   Notes  °Sickle Cell Patients, [facility] [address], [place] (336) 832-1970   °[facility] [address], [place] (336) 832-4400   °[facility] ° [address], [place] (336) 992-4800   °[facility]/Dr. Osei-Bonsu ° [address] [place] [address], [place] or [address], [place] (336) 841-8500 Phone number for both [place] and [place] locations is the same.  °[facility] [address], [place] (336) 299-0000   °[facility] [address] [place] [address], [place] or [address] (336) 852-7530 °(336) 878-2260   °[facility] [address], [place] (336) 350-1642, phone; (336) 294-5005, fax Sees patients 1st and 3rd Saturday of every month.  Must not qualify for public or private insurance (i.e. Medicaid, Medicare, [place] Health Choice, Veterans' Benefits) • Household income should be no more than 200% of the poverty level •The clinic cannot treat you if you are pregnant or think you are pregnant • Sexually transmitted diseases are not treated at the clinic.  ° ° °Dental Care: °Organization         Address  Phone  Notes  °[place] [facility] [address], [place] (336) 641-6152 Accepts children up to age 21 who are enrolled in Medicaid or [place] Health Choice; pregnant women with a Medicaid card; and children who have applied for Medicaid or [place] Health Choice, but were declined, whose parents can pay a reduced fee at time of service.  °[place]  [facility] [place]  [address], [place] (336) 641-7733 Accepts children up to age 21 who are enrolled in Medicaid or [place] Health Choice; pregnant women with a Medicaid card; and children who have applied for Medicaid or [place] Health Choice, but were declined, whose parents can pay a reduced fee at time of service.  °[facility] ° [address],  (336) 641-4533 Patients are seen by appointment only. Walk-ins are not accepted. [facility] will see patients 18 years of age and older. °Monday - Tuesday (8am-5pm) °Most Wednesdays (8:30-5pm) °$30 per visit, cash only  °[facility] ° [address], [place] (336) 641-4533 Patients are seen by appointment only. Walk-ins are not accepted. [facility] will see patients 18 years of age and older. °One   Wednesday Evening (Monthly: Volunteer Based).  $30 per visit, cash only  °[facility]  (919) 537-3737 for adults; Children under age 4, call Graduate Pediatric Dentistry at ([phone]) [phone]. Children aged 4-14, please call ([phone]) [phone] to request a pediatric application. ° Dental services are provided in all areas of dental care including fillings, crowns and bridges, complete and partial dentures, implants, gum treatment, root canals, and extractions. Preventive care is also provided. Treatment is provided to both adults and children. °Patients are selected via a lottery and there is often a waiting list. °  °[facility] [address], °[place] ° (336) 763-8833 [email or web address] °  °[facility] [address], [place], [place] (336)723-1848, Ext. 123 Second and Fourth Thursday of each month, opens at 6:30 AM; Clinic ends at 9 AM.  Patients are seen on a first-come first-served basis, and a limited number are seen during each clinic.  ° °[facility] ° [address], [place], [place] (336) 723-7904    Eligibility Requirements °You must have lived in [place], [place], or [place] counties for at least the last three months. °  You cannot be eligible for state or federal sponsored healthcare insurance, including Veterans Administration, Medicaid, or Medicare. °  You generally cannot be eligible for healthcare insurance through your employer.  °  How to apply: °Eligibility screenings are held every Tuesday and Wednesday afternoon from 1:00 pm until 4:00 pm. You do not need an appointment for the interview!  °[facility] [address], [place], [place] 336-631-2330   °[facility]  336-342-8273   °[facility]  336-703-3100   °[facility]  336-570-6415   ° °Behavioral Health Resources in the Community: °Intensive Outpatient Programs °Organization         Address  Phone  Notes  °[facility] [address], [place], [place] 336-878-6098   °[facility] [address], [place], [place] 336-832-9800   °[facility] [address], [place], [place] ° 336-882-2125   °[facility] [address],  °[place], [place] 1-800-853-5163 or 336-641-4981   °Substance Abuse Resources °Organization         Address  Phone  Notes  °[facility]  336-882-2125   °[facility]  336-784-9470   °[facility]  336-285-9073   °[facility]  336-845-3988   °Residential & Outpatient Substance Abuse Program  1-800-659-3381   °Psychological Services °Organization         Address  Phone  Notes  °[facility]  336- 832-9600   °[facility]  336- 378-7881   °[facility] [address], [place] 1-800-853-5163 or 336-641-4981   ° °Mobile Crisis Teams °Organization         Address  Phone  Notes  °[facility], Mobile Crisis Care Unit  1-877-626-1772   °Assertive °[facility] ° [address]  [place], [place] 336-834-9664   °Sharon DeEsch [address] °[place] 336-554-5454   ° °Self-Help/Support Groups °Organization         Address  Phone             Notes  °[facility] [place] - variety of support groups  336- 373-1402 Call for more information  °Narcotics Anonymous (NA), [facility] [address], °[place]  2 meetings at this location  ° °  Residential Treatment Programs Organization         Address  Phone  Notes  [facility] [address]    [place]  1-703-284-4486   [facility]  [address] [place] [address], [place], [place]   [place] [facility], [place] 4752746215 Admissions: 8am-3pm M-F  [facility] [place] [address],    [place], [place] 101-751-0258   [facility] [address] [place], [place], [place]   [facility] [address],  [place], [place]   [facility] Intensive Outpatient [address], [place], [place]   [facility] ([place]  [place], [place] 1-915 847 9119 or 857-611-6905   [facility] ([facility] [address], [place], [place] Accepts Medicaid  [facility] [address],  [place] 1-806-505-0242 Substance Abuse/Addiction Treatment   [facility] Organization         Address  Phone  Notes  [facility]  2393863624   Domenic Schwab, PhD [address] [place] [place], [place]   6184793257 or 930-482-4903   [place], [place] (424) 793-0060   [facility] [address] [place], [place] 605-617-2038 Insurance/Medicaid/sponsorship through [facility] [address]                                    [place], [place] (845)189-8132 [facility] [address], [place] (262) [phone]    Dr. Adele Schilder  618-405-8334   [facility] Dept. 1) [address] [place] 2) [place] 3)  [address] [place], [place] (351)776-2083 (601) [phone]  3652758489   [place] (586)011-2892 or 905-781-6234 (After Hours)      Take the prescription as directed.  Apply moist heat or ice to the area(s) of discomfort, for 15 minutes at a time, several times per day for the next few days.  Do not fall asleep on a heating or ice pack.  Call your regular medical doctor today to schedule a follow up appointment in the next 2 days.  Return to the Emergency Department immediately if worsening.
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
## 2014-10-08 NOTE — ED Notes (Signed)
MD McManus at bedside. 

## 2014-10-28 ENCOUNTER — Ambulatory Visit (INDEPENDENT_AMBULATORY_CARE_PROVIDER_SITE_OTHER): Payer: Medicare Other | Admitting: *Deleted

## 2014-10-28 DIAGNOSIS — Z7901 Long term (current) use of anticoagulants: Secondary | ICD-10-CM | POA: Diagnosis not present

## 2014-10-28 DIAGNOSIS — Z5181 Encounter for therapeutic drug level monitoring: Secondary | ICD-10-CM | POA: Diagnosis not present

## 2014-10-28 DIAGNOSIS — I4891 Unspecified atrial fibrillation: Secondary | ICD-10-CM

## 2014-10-28 DIAGNOSIS — I480 Paroxysmal atrial fibrillation: Secondary | ICD-10-CM

## 2014-10-28 LAB — POCT INR: INR: 2.6

## 2014-11-23 ENCOUNTER — Other Ambulatory Visit: Payer: Self-pay | Admitting: Cardiovascular Disease

## 2014-12-07 ENCOUNTER — Other Ambulatory Visit: Payer: Self-pay | Admitting: Cardiovascular Disease

## 2014-12-09 ENCOUNTER — Ambulatory Visit (INDEPENDENT_AMBULATORY_CARE_PROVIDER_SITE_OTHER): Payer: Medicare Other | Admitting: *Deleted

## 2014-12-09 DIAGNOSIS — I480 Paroxysmal atrial fibrillation: Secondary | ICD-10-CM

## 2014-12-09 DIAGNOSIS — Z7901 Long term (current) use of anticoagulants: Secondary | ICD-10-CM | POA: Diagnosis not present

## 2014-12-09 DIAGNOSIS — Z5181 Encounter for therapeutic drug level monitoring: Secondary | ICD-10-CM

## 2014-12-09 DIAGNOSIS — I4891 Unspecified atrial fibrillation: Secondary | ICD-10-CM | POA: Diagnosis not present

## 2014-12-09 LAB — POCT INR: INR: 2.6

## 2014-12-21 ENCOUNTER — Encounter: Payer: Self-pay | Admitting: Internal Medicine

## 2014-12-23 ENCOUNTER — Ambulatory Visit (INDEPENDENT_AMBULATORY_CARE_PROVIDER_SITE_OTHER): Payer: Medicare Other | Admitting: Cardiovascular Disease

## 2014-12-23 ENCOUNTER — Encounter: Payer: Self-pay | Admitting: Cardiovascular Disease

## 2014-12-23 VITALS — BP 140/82 | HR 64 | Ht 73.0 in | Wt 224.6 lb

## 2014-12-23 DIAGNOSIS — I4819 Other persistent atrial fibrillation: Secondary | ICD-10-CM

## 2014-12-23 DIAGNOSIS — Z7901 Long term (current) use of anticoagulants: Secondary | ICD-10-CM

## 2014-12-23 DIAGNOSIS — I481 Persistent atrial fibrillation: Secondary | ICD-10-CM | POA: Diagnosis not present

## 2014-12-23 DIAGNOSIS — G4733 Obstructive sleep apnea (adult) (pediatric): Secondary | ICD-10-CM

## 2014-12-23 DIAGNOSIS — Z5181 Encounter for therapeutic drug level monitoring: Secondary | ICD-10-CM | POA: Diagnosis not present

## 2014-12-23 DIAGNOSIS — I10 Essential (primary) hypertension: Secondary | ICD-10-CM | POA: Diagnosis not present

## 2014-12-23 DIAGNOSIS — E785 Hyperlipidemia, unspecified: Secondary | ICD-10-CM

## 2014-12-23 MED ORDER — PRAVASTATIN SODIUM 40 MG PO TABS: 40.0000 mg | ORAL_TABLET | Freq: Every day | ORAL | Status: DC

## 2014-12-23 MED ORDER — VERAPAMIL HCL ER 180 MG PO TBCR: 360.0000 mg | EXTENDED_RELEASE_TABLET | Freq: Every day | ORAL | Status: DC

## 2014-12-23 MED ORDER — LISINOPRIL 40 MG PO TABS: 40.0000 mg | ORAL_TABLET | Freq: Every day | ORAL | Status: DC

## 2014-12-23 MED ORDER — DOXAZOSIN MESYLATE 4 MG PO TABS: 12.0000 mg | ORAL_TABLET | Freq: Every day | ORAL | Status: DC

## 2014-12-23 MED ORDER — DILTIAZEM HCL 30 MG PO TABS: 30.0000 mg | ORAL_TABLET | Freq: Every day | ORAL | Status: DC | PRN

## 2014-12-23 MED ORDER — CHLORTHALIDONE 25 MG PO TABS: 12.5000 mg | ORAL_TABLET | Freq: Every day | ORAL | Status: DC

## 2014-12-23 NOTE — Patient Instructions (Signed)
Your physician wants you to follow-up in: 1 year with Dr. Koneswaran.  You will receive a reminder letter in the mail two months in advance. If you don't receive a letter, please call our office to schedule the follow-up appointment.  Your physician recommends that you continue on your current medications as directed. Please refer to the Current Medication list given to you today.  If you need a refill on your cardiac medications before your next appointment, please call your pharmacy.  Thank you for choosing [facility]!   

## 2014-12-23 NOTE — Progress Notes (Signed)
Patient ID: Riley [name], male   DOB: [birth year], 69 y.o.   MRN: [id number]      SUBJECTIVE: The patient presents for follow up of paroxysmal atrial fibrillation for which he takes warfarin, hypertension, hyperlipidemia, and sleep apnea for which he is on CPAP.  He is very happy to report that he has been doing very well and has only had to use diltiazem twice in the past year. On both occasions he said it was during the hot and humid days of this past summer and he felt he was dehydrated. He denies exertional chest pain, shortness of breath, and leg swelling. He uses CPAP every night.  ECG performed in the office today demonstrates sinus rhythm with frequent PVCs, heart rate 61 bpm, right bundle branch block with left anterior fascicular block.   Review of Systems: As per "subjective", otherwise negative.  No Known Allergies  Current Outpatient Prescriptions  Medication Sig Dispense Refill  . chlorthalidone (HYGROTON) 25 MG tablet Take 0.5 tablets (12.5 mg total) by mouth daily. 45 tablet 3  . COUMADIN 5 MG tablet TAKE 1 TABLET EVERY DAY *EXCEPT* ON TUESDAYS, THURSDAYS AND SATURDAYSTAKE 1 AND 1/2 TABLETS. 45 tablet 6  . diltiazem (CARDIZEM) 30 MG tablet Take 1 tablet (30 mg total) by mouth daily as needed (Palpations). 30 tablet 6  . doxazosin (CARDURA) 4 MG tablet Take 12 mg by mouth at bedtime.     Marland Kitchen HYDROcodone-acetaminophen (NORCO/VICODIN) 5-325 MG per tablet 1 tab PO q6 hours prn pain 15 tablet 0  . lisinopril (PRINIVIL,ZESTRIL) 40 MG tablet TAKE ONE TABLET BY MOUTH ONCE DAILY. 90 tablet 3  . polyethylene glycol powder (MIRALAX) powder Take 17 g by mouth daily. 527 g 11  . pravastatin (PRAVACHOL) 40 MG tablet Take 40 mg by mouth daily.    . ranitidine (ZANTAC) 150 MG tablet Take 150 mg by mouth 2 (two) times daily.      . sitaGLIPtan-metformin (JANUMET) 50-1000 MG per tablet Take 1 tablet by mouth 2 (two) times daily with a meal.    . Triamcinolone Acetonide (NASACORT ALLERGY 24HR  NA) Place 1 spray into the nose daily as needed (congestion).    . verapamil (CALAN-SR) 180 MG CR tablet TAKE 2 TABLETS BY MOUTH AT BEDTIME. 60 tablet 3  . Wheat Dextrin (BENEFIBER) POWD Take 1 scoop by mouth daily.      No current facility-administered medications for this visit.    Past Medical History  Diagnosis Date  . Paroxysmal atrial fibrillation ([place])     12/2005; sinus bradycardia secondary to medications  . Chronic anticoagulation   . Hypertension     normal coronary angiography and normal EF in 12/98  . Diabetes mellitus, type 2 ([facility])     no insulin  . Chronic back pain   . Nodule of left lung     Granuloma in left upper lobe  . Edema   . Hyperlipidemia   . Sleep apnea   . GERD (gastroesophageal reflux disease)     Past Surgical History  Procedure Laterality Date  . Appendectomy  1998  . Tonsillectomy      as a child  . Colonoscopy  Never  . Colonoscopy N/A 11/26/2012    KKX:FGHWE 2 hemorrhoids. Pancolonic diverticulosis. Colonic polyps removed as described above. One hemostasis clip applied. TUBULAR ADENOMA. Surveillance Sept 2019.   Marland Kitchen Insertion of mesh N/A 01/27/2013    Procedure: INSERTION OF MESH;  Surgeon: Jamesetta So, MD;  Location: [facility];  Service:  General;  Laterality: N/A;  . Umbilical hernia repair N/A 01/27/2013    Procedure: HERNIA REPAIR UMBILICAL ADULT;  Surgeon: Jamesetta So, MD;  Location: [facility];  Service: General;  Laterality: N/A;    Social History   Social History  . Marital Status: Married    Spouse Name: N/A  . Number of Children: N/A  . Years of Education: N/A   Occupational History  . 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 Smoking status: Never Smoker   . Smokeless tobacco: Never Used  . Alcohol Use: No  . Drug Use: No  . Sexual Activity: Not on file   Other Topics Concern  . Not on file   Social History Narrative     Filed Vitals:   12/23/14 1318  BP: 140/82  Pulse: 64   Height: 6\' 1"  (1.854 m)  Weight: 224 lb 9.6 oz (101.878 kg)  SpO2: 97%    PHYSICAL EXAM General: NAD HEENT: Normal. Neck: No JVD, no thyromegaly. Lungs: Clear to auscultation bilaterally with normal respiratory effort. CV: Nondisplaced PMI.  Regular rate and mostly regular rhythm, normal S1/S2, no S3/S4, no murmur. No pretibial or periankle edema.  No carotid bruit.  Abdomen: Soft, nontender, obese. Neurologic: Alert and oriented x 3.  Psych: Normal affect. Skin: Normal. Musculoskeletal: Normal range of motion, no gross deformities. Extremities: No clubbing or cyanosis.   ECG: Most recent ECG reviewed.      ASSESSMENT AND PLAN: 1. Paroxysmal atrial fibrillation: Stable and maintaining sinus rhythm. He is tolerating warfarin without bleeding complications. I previously discussed alternative forms of anticoagulation with the newer target specific oral anticoagulants, but he preferred to stay on warfarin. Will refill prn diltiazem and long-acting verapamil 180 mg daily.  2. Essential HTN: Borderline elevated on present therapy. Will monitor. No changes.  3. Hyperlipidemia: On pravastatin.   4. Sleep apnea: Using CPAP.   Dispo: f/u 1 year.   Kate Sable, M.D., F.A.C.C.

## 2015-01-11 ENCOUNTER — Telehealth: Payer: Self-pay | Admitting: Cardiovascular Disease

## 2015-01-11 NOTE — Telephone Encounter (Signed)
Called [facility] and asked for all lipid labs.they states pts been on pravastatin 20 mg for almost 2 yrs.Our noted indicate he is on 81 mg.I will show labs to Dr Bronson Ing for advice

## 2015-01-11 NOTE — Telephone Encounter (Signed)
Patient has questions regarding the dosage of his Pravastatin / tg

## 2015-01-11 NOTE — Telephone Encounter (Signed)
Received lipids from [facility],placed on dr Court Joy desk for review

## 2015-01-18 ENCOUNTER — Other Ambulatory Visit: Payer: Self-pay | Admitting: Cardiovascular Disease

## 2015-01-19 ENCOUNTER — Other Ambulatory Visit: Payer: Self-pay | Admitting: Cardiovascular Disease

## 2015-01-19 NOTE — Telephone Encounter (Signed)
Medication is not on pt's med list. Please advise

## 2015-01-19 NOTE — Telephone Encounter (Signed)
It  appears pt is on CCB now instead of BB,will clarify with Dr Bronson Ing

## 2015-01-19 NOTE — Telephone Encounter (Signed)
I spoke with wife and clarified he is not on BB

## 2015-01-19 NOTE — Telephone Encounter (Signed)
pls call pt concerning his medication Acebutolo, pt needs a refill sent to the pharmacy and they're saying it is not on his med list

## 2015-01-20 ENCOUNTER — Ambulatory Visit (INDEPENDENT_AMBULATORY_CARE_PROVIDER_SITE_OTHER): Payer: Medicare Other | Admitting: *Deleted

## 2015-01-20 DIAGNOSIS — Z7901 Long term (current) use of anticoagulants: Secondary | ICD-10-CM | POA: Diagnosis not present

## 2015-01-20 DIAGNOSIS — I480 Paroxysmal atrial fibrillation: Secondary | ICD-10-CM

## 2015-01-20 DIAGNOSIS — Z5181 Encounter for therapeutic drug level monitoring: Secondary | ICD-10-CM

## 2015-01-20 DIAGNOSIS — I4891 Unspecified atrial fibrillation: Secondary | ICD-10-CM | POA: Diagnosis not present

## 2015-01-20 LAB — POCT INR: INR: 2.1

## 2015-01-25 ENCOUNTER — Telehealth: Payer: Self-pay | Admitting: Cardiovascular Disease

## 2015-01-25 ENCOUNTER — Other Ambulatory Visit: Payer: Self-pay | Admitting: Gastroenterology

## 2015-01-25 NOTE — Telephone Encounter (Signed)
Called Riley [name], spoke to his wife. She states that her husband was having problems over the weekend with his heart going in and out of rhythm. She claims it was worse last night around midnight as he woke up complaining of his rapid heartbeat. Wife took his bp which was 166/100 hr was 142 bpm. Riley [name] took his diltiazem and she said it seemed to help some, although he ended up taking another diltiazem a while later. Wife states that around 8 am his bp was 146/78 hr was 69 bpm. She was wondering if could be from where his medication was changed at his last office visit as she stated. She also states that her husband is sick with a bad cold ( sinus&cough) and was wondering if that can cause his rapid hr. Please advise!

## 2015-01-25 NOTE — Telephone Encounter (Signed)
Patient's wife would like to speak with nurse regarding "patient's heart going out of rhythm last night". / tg

## 2015-01-27 ENCOUNTER — Other Ambulatory Visit: Payer: Self-pay

## 2015-01-27 MED ORDER — PRAVASTATIN SODIUM 20 MG PO TABS: 20.0000 mg | ORAL_TABLET | Freq: Every evening | ORAL | Status: DC

## 2015-01-27 NOTE — Telephone Encounter (Signed)
Likely precipitated by upper respiratory infection, a common phenomenon. Continue diltiazem prn.

## 2015-01-27 NOTE — Telephone Encounter (Signed)
Dr Bronson Ing reviewed lipids from pcp and states pt can reduce his pravastatin to 20 mg at dinner.I changed rx and left message on pt's private vm

## 2015-01-27 NOTE — Telephone Encounter (Signed)
I spoke with wife and relayed MD message.No further complaints from pt

## 2015-02-09 ENCOUNTER — Encounter: Payer: Self-pay | Admitting: Internal Medicine

## 2015-02-09 ENCOUNTER — Ambulatory Visit (INDEPENDENT_AMBULATORY_CARE_PROVIDER_SITE_OTHER): Payer: Medicare Other | Admitting: Internal Medicine

## 2015-02-09 VITALS — BP 144/84 | HR 61 | Temp 97.8°F | Ht 73.0 in | Wt 227.2 lb

## 2015-02-09 DIAGNOSIS — Z86010 Personal history of colonic polyps: Secondary | ICD-10-CM | POA: Diagnosis not present

## 2015-02-09 DIAGNOSIS — K5900 Constipation, unspecified: Secondary | ICD-10-CM | POA: Diagnosis not present

## 2015-02-09 NOTE — Patient Instructions (Signed)
Constipation information  Continue Benefiber and Miralax daily  Office visit in 1 year  Colonoscopy in 2019

## 2015-02-09 NOTE — Progress Notes (Signed)
Primary Care Physician:  Glo Herring., MD Primary Gastroenterologist:  Dr. Gala Romney  Pre-Procedure History & Physical: HPI:  Riley [name] is a 69 y.o. male here for follow-up of chronic constipation;  started on Benefiber and MiraLax last year with extremely good results. Takes Benefiber and a dose of MiraLax daily;  continues to have one bowel movement daily to every other day. Hasn't had any rectal bleeding. He is chronically anticoagulated due to atrial fibrillation.  History of colonic adenoma removed in 2014; due surveillance examination 2019.  Past Medical History  Diagnosis Date  . Paroxysmal atrial fibrillation ([place])     12/2005; sinus bradycardia secondary to medications  . Chronic anticoagulation   . Hypertension     normal coronary angiography and normal EF in 12/98  . Diabetes mellitus, type 2 (HCC)     no insulin  . Chronic back pain   . Nodule of left lung     Granuloma in left upper lobe  . Edema   . Hyperlipidemia   . Sleep apnea   . GERD (gastroesophageal reflux disease)     Past Surgical History  Procedure Laterality Date  . Appendectomy  1998  . Tonsillectomy      as a child  . Colonoscopy  Never  . Colonoscopy N/A 11/26/2012    [id number] hemorrhoids. Pancolonic diverticulosis. Colonic polyps removed as described above. One hemostasis clip applied. TUBULAR ADENOMA. Surveillance Sept 2019.   Marland Kitchen Insertion of mesh N/A 01/27/2013    Procedure: INSERTION OF MESH;  Surgeon: Jamesetta So, MD;  Location: [facility];  Service: General;  Laterality: N/A;  . Umbilical hernia repair N/A 01/27/2013    Procedure: HERNIA REPAIR UMBILICAL ADULT;  Surgeon: Jamesetta So, MD;  Location: [facility];  Service: General;  Laterality: N/A;    Prior to Admission medications   Medication Sig Start Date End Date Taking? Authorizing Provider  chlorthalidone (HYGROTON) 25 MG tablet Take 0.5 tablets (12.5 mg total) by mouth daily. 12/23/14  Yes Herminio Commons, MD  COUMADIN 5 MG  tablet TAKE 1 TABLET EVERY DAY *EXCEPT* ON TUESDAYS, THURSDAYS AND SATURDAYSTAKE 1 AND 1/2 TABLETS. 09/14/14  Yes Herminio Commons, MD  diltiazem (CARDIZEM) 30 MG tablet Take 1 tablet (30 mg total) by mouth daily as needed (Palpations). 12/23/14  Yes Herminio Commons, MD  doxazosin (CARDURA) 4 MG tablet Take 3 tablets (12 mg total) by mouth at bedtime. 12/23/14  Yes Herminio Commons, MD  HYDROcodone-acetaminophen (NORCO/VICODIN) 5-325 MG per tablet 1 tab PO q6 hours prn pain 10/08/14  Yes Francine Graven, DO  lisinopril (PRINIVIL,ZESTRIL) 40 MG tablet Take 1 tablet (40 mg total) by mouth daily. 12/23/14  Yes Herminio Commons, MD  polyethylene glycol powder (GLYCOLAX/MIRALAX) powder MIX 1 CAPFUL (17 GRAMS) WITH 8OZ OF WATER OR JUICE DAILY. 01/25/15  Yes Mahala Menghini, PA-C  pravastatin (PRAVACHOL) 20 MG tablet Take 1 tablet (20 mg total) by mouth every evening. 01/27/15  Yes Herminio Commons, MD  ranitidine (ZANTAC) 150 MG tablet Take 150 mg by mouth 2 (two) times daily.     Yes Historical Provider, MD  sitaGLIPtan-metformin (JANUMET) 50-1000 MG per tablet Take 1 tablet by mouth 2 (two) times daily with a meal.   Yes Historical Provider, MD  Triamcinolone Acetonide (NASACORT ALLERGY 24HR NA) Place 1 spray into the nose daily as needed (congestion).   Yes Historical Provider, MD  verapamil (CALAN-SR) 180 MG CR tablet Take 2 tablets (360 mg total)  by mouth at bedtime. 12/23/14  Yes Herminio Commons, MD  Wheat Dextrin (BENEFIBER) POWD Take 1 scoop by mouth daily.    Yes Historical Provider, MD    Allergies as of 02/09/2015  . (No Known Allergies)    Family History  Problem Relation Age of Onset  . Heart disease Father   . Heart disease Mother   . Allergies      whole family  . Bronchitis Mother   . Colon cancer Neg Hx     Social History   Social History  . Marital Status: Married    Spouse Name: N/A  . Number of Children: N/A  . Years of Education: N/A   Occupational  History  . 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 Smoking status: Never Smoker   . Smokeless tobacco: Never Used  . Alcohol Use: No  . Drug Use: No  . Sexual Activity: Not on file   Other Topics Concern  . Not on file   Social History Narrative    Review of Systems: See HPI, otherwise negative ROS  Physical Exam: BP 144/84 mmHg  Pulse 61  Temp(Src) 97.8 F (36.6 C)  Ht 6\' 1"  (1.854 m)  Wt 227 lb 3.2 oz (103.057 kg)  BMI 29.98 kg/m2 General:    well-nourished, pleasant and cooperative in NAD.  Accompanied by his spouse Neck:  Supple; no masses or thyromegaly. No significant cervical adenopathy. Lungs:  Clear throughout to auscultation.   No wheezes, crackles, or rhonchi. No acute distress. Heart:  Irregularly irregular. rhythm; no murmurs, clicks, rubs,  or gallops. Abdomen: Non-distended, normal bowel sounds.  Soft and nontender without appreciable mass or hepatosplenomegaly.  Pulses:  Normal pulses noted. Extremities:  Without clubbing or edema.  Impression:  All in all, constipation well managed with his regimen of daily fiber and MiraLax. No alarm symptoms. History of colonic adenoma; due for surveillance examination 2019.  Recommendations:   Constipation information provided.  Continue Benefiber and Miralax daily.  Office visit in 1 year.  Colonoscopy in 2019         Notice: This dictation was prepared with Dragon dictation along with smaller phrase technology. Any transcriptional errors that result from this process are unintentional and may not be corrected upon review.

## 2015-02-11 ENCOUNTER — Ambulatory Visit (INDEPENDENT_AMBULATORY_CARE_PROVIDER_SITE_OTHER): Payer: Medicare Other | Admitting: Adult Health

## 2015-02-11 ENCOUNTER — Encounter: Payer: Self-pay | Admitting: Adult Health

## 2015-02-11 VITALS — BP 134/88 | HR 60 | Temp 97.9°F | Ht 73.0 in | Wt 231.0 lb

## 2015-02-11 DIAGNOSIS — G4733 Obstructive sleep apnea (adult) (pediatric): Secondary | ICD-10-CM

## 2015-02-11 NOTE — Assessment & Plan Note (Signed)
Good control on CPAP  Download requested   Plan  Continue on CPAP At bedtime   Wear for at least 6hrs each night.  Work on weight loss.  Do not drive if sleepy.  Follow up Dr. Elsworth Soho  In 1 year and As needed

## 2015-02-11 NOTE — Patient Instructions (Signed)
Continue on CPAP At bedtime   Wear for at least 6hrs each night.  Work on weight loss.  Do not drive if sleepy.  Follow up Dr. Elsworth Soho  In 1 year and As needed

## 2015-02-11 NOTE — Addendum Note (Signed)
Addended by: Osa Craver on: 02/11/2015 04:32 PM   Modules accepted: Orders

## 2015-02-11 NOTE — Assessment & Plan Note (Signed)
Wt loss  

## 2015-02-11 NOTE — Progress Notes (Signed)
Subjective:    Patient ID: Riley [name], male    DOB: [birth year], 69 y.o.   MRN: [id number]  HPI  69 yo male with OSA   02/11/2015 Follow up OSA Patient returns for a one-year follow-up for sleep apnea Says overall he is doing well and wears CPAP  on average of 6hr each night.  Patient says he feels rested with no significant daytime sleepiness. Download requested.  Says the nasal mask works so well. Feels so much better with CPAP.  Patient denies any chest pain, orthopnea, PND or leg swelling Using a sleep apnea pillow, works great with his tubing.    Past Medical History  Diagnosis Date  . Paroxysmal atrial fibrillation ([place])     12/2005; sinus bradycardia secondary to medications  . Chronic anticoagulation   . Hypertension     normal coronary angiography and normal EF in 12/98  . Diabetes mellitus, type 2 (HCC)     no insulin  . Chronic back pain   . Nodule of left lung     Granuloma in left upper lobe  . Edema   . Hyperlipidemia   . Sleep apnea   . GERD (gastroesophageal reflux disease)    Current Outpatient Prescriptions on File Prior to Visit  Medication Sig Dispense Refill  . chlorthalidone (HYGROTON) 25 MG tablet Take 0.5 tablets (12.5 mg total) by mouth daily. 45 tablet 11  . COUMADIN 5 MG tablet TAKE 1 TABLET EVERY DAY *EXCEPT* ON TUESDAYS, THURSDAYS AND SATURDAYSTAKE 1 AND 1/2 TABLETS. 45 tablet 6  . diltiazem (CARDIZEM) 30 MG tablet Take 1 tablet (30 mg total) by mouth daily as needed (Palpations). 30 tablet 11  . doxazosin (CARDURA) 4 MG tablet Take 3 tablets (12 mg total) by mouth at bedtime. 90 tablet 11  . lisinopril (PRINIVIL,ZESTRIL) 40 MG tablet Take 1 tablet (40 mg total) by mouth daily. 90 tablet 3  . polyethylene glycol powder (GLYCOLAX/MIRALAX) powder MIX 1 CAPFUL (17 GRAMS) WITH 8OZ OF WATER OR JUICE DAILY. 527 g 11  . pravastatin (PRAVACHOL) 20 MG tablet Take 1 tablet (20 mg total) by mouth every evening. 90 tablet 3  . ranitidine (ZANTAC) 150 MG  tablet Take 150 mg by mouth 2 (two) times daily.      . sitaGLIPtan-metformin (JANUMET) 50-1000 MG per tablet Take 1 tablet by mouth 2 (two) times daily with a meal.    . Triamcinolone Acetonide (NASACORT ALLERGY 24HR NA) Place 1 spray into the nose daily as needed (congestion).    . verapamil (CALAN-SR) 180 MG CR tablet Take 2 tablets (360 mg total) by mouth at bedtime. 60 tablet 11  . Wheat Dextrin (BENEFIBER) POWD Take 1 scoop by mouth daily.     Marland Kitchen HYDROcodone-acetaminophen (NORCO/VICODIN) 5-325 MG per tablet 1 tab PO q6 hours prn pain (Patient not taking: Reported on 02/11/2015) 15 tablet 0   No current facility-administered medications on file prior to visit.     Review of Systems Constitutional:   No  weight loss, night sweats,  Fevers, chills, fatigue, or  lassitude.  HEENT:   No headaches,  Difficulty swallowing,  Tooth/dental problems, or  Sore throat,                No sneezing, itching, ear ache, nasal congestion, post nasal drip,   CV:  No chest pain,  Orthopnea, PND, swelling in lower extremities, anasarca, dizziness, palpitations, syncope.   GI  No heartburn, indigestion, abdominal pain, nausea, vomiting, diarrhea, change in bowel  habits, loss of appetite, bloody stools.   Resp: No shortness of breath with exertion or at rest.  No excess mucus, no productive cough,  No non-productive cough,  No coughing up of blood.  No change in color of mucus.  No wheezing.  No chest wall deformity  Skin: no rash or lesions.  GU: no dysuria, change in color of urine, no urgency or frequency.  No flank pain, no hematuria   MS:  No joint pain or swelling.  No decreased range of motion.  No back pain.  Psych:  No change in mood or affect. No depression or anxiety.  No memory loss.         Objective:   Physical Exam GEN: A/Ox3; pleasant , NAD, obese   HEENT:  [place]/AT,  EACs-clear, TMs-wnl, NOSE-clear, THROAT-clear, no lesions, no postnasal drip or exudate noted. Class 3 MP airway    NECK:  Supple w/ fair ROM; no JVD; normal carotid impulses w/o bruits; no thyromegaly or nodules palpated; no lymphadenopathy.  RESP  Clear  P & A; w/o, wheezes/ rales/ or rhonchi.no accessory muscle use, no dullness to percussion  CARD:  RRR, no m/r/g  , no peripheral edema, pulses intact, no cyanosis or clubbing.  GI:   Soft & nt; nml bowel sounds; no organomegaly or masses detected.  Musco: Warm bil, no deformities or joint swelling noted.   Neuro: alert, no focal deficits noted.    Skin: Warm, no lesions or rashes        Assessment & Plan:

## 2015-02-12 NOTE — Progress Notes (Signed)
Reviewed & agree with plan  

## 2015-03-03 ENCOUNTER — Ambulatory Visit (INDEPENDENT_AMBULATORY_CARE_PROVIDER_SITE_OTHER): Payer: Medicare Other | Admitting: *Deleted

## 2015-03-03 DIAGNOSIS — I4891 Unspecified atrial fibrillation: Secondary | ICD-10-CM

## 2015-03-03 DIAGNOSIS — Z7901 Long term (current) use of anticoagulants: Secondary | ICD-10-CM

## 2015-03-03 DIAGNOSIS — Z5181 Encounter for therapeutic drug level monitoring: Secondary | ICD-10-CM | POA: Diagnosis not present

## 2015-03-03 DIAGNOSIS — I480 Paroxysmal atrial fibrillation: Secondary | ICD-10-CM | POA: Diagnosis not present

## 2015-03-03 LAB — POCT INR: INR: 2.3

## 2015-03-11 ENCOUNTER — Telehealth: Payer: Self-pay | Admitting: Pulmonary Disease

## 2015-03-11 NOTE — Telephone Encounter (Signed)
Compliance Report - 01/30/14 - 02/11/15  Per RA: CPAP 11 cm, effective Good usage Continue same  AHI: 1.8

## 2015-03-12 NOTE — Telephone Encounter (Signed)
Relayed results/recs to pt's wife (dpr on file).  Nothing further needed.

## 2015-03-17 ENCOUNTER — Encounter: Payer: Self-pay | Admitting: Adult Health

## 2015-04-14 ENCOUNTER — Ambulatory Visit (INDEPENDENT_AMBULATORY_CARE_PROVIDER_SITE_OTHER): Payer: Medicare Other | Admitting: *Deleted

## 2015-04-14 DIAGNOSIS — Z7901 Long term (current) use of anticoagulants: Secondary | ICD-10-CM

## 2015-04-14 DIAGNOSIS — Z5181 Encounter for therapeutic drug level monitoring: Secondary | ICD-10-CM

## 2015-04-14 DIAGNOSIS — I4891 Unspecified atrial fibrillation: Secondary | ICD-10-CM

## 2015-04-14 DIAGNOSIS — I480 Paroxysmal atrial fibrillation: Secondary | ICD-10-CM

## 2015-04-14 LAB — POCT INR: INR: 2.3

## 2015-04-23 ENCOUNTER — Telehealth: Payer: Self-pay | Admitting: Cardiovascular Disease

## 2015-04-23 NOTE — Telephone Encounter (Signed)
LM on number listed telling pt to go to ED for evaluation

## 2015-04-23 NOTE — Telephone Encounter (Signed)
Pt has been having an "achy feeling" in his chest for the past 3 days

## 2015-05-26 ENCOUNTER — Ambulatory Visit (INDEPENDENT_AMBULATORY_CARE_PROVIDER_SITE_OTHER): Payer: Medicare Other | Admitting: *Deleted

## 2015-05-26 DIAGNOSIS — Z5181 Encounter for therapeutic drug level monitoring: Secondary | ICD-10-CM | POA: Diagnosis not present

## 2015-05-26 DIAGNOSIS — I480 Paroxysmal atrial fibrillation: Secondary | ICD-10-CM

## 2015-05-26 DIAGNOSIS — Z7901 Long term (current) use of anticoagulants: Secondary | ICD-10-CM

## 2015-05-26 DIAGNOSIS — I4891 Unspecified atrial fibrillation: Secondary | ICD-10-CM

## 2015-05-26 LAB — POCT INR: INR: 2.2

## 2015-07-07 ENCOUNTER — Ambulatory Visit (INDEPENDENT_AMBULATORY_CARE_PROVIDER_SITE_OTHER): Payer: Medicare Other | Admitting: *Deleted

## 2015-07-07 DIAGNOSIS — I4891 Unspecified atrial fibrillation: Secondary | ICD-10-CM | POA: Diagnosis not present

## 2015-07-07 DIAGNOSIS — I480 Paroxysmal atrial fibrillation: Secondary | ICD-10-CM

## 2015-07-07 DIAGNOSIS — Z5181 Encounter for therapeutic drug level monitoring: Secondary | ICD-10-CM

## 2015-07-07 DIAGNOSIS — Z7901 Long term (current) use of anticoagulants: Secondary | ICD-10-CM | POA: Diagnosis not present

## 2015-07-07 LAB — POCT INR: INR: 2
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
## 2015-08-11 ENCOUNTER — Ambulatory Visit (INDEPENDENT_AMBULATORY_CARE_PROVIDER_SITE_OTHER): Payer: Medicare Other | Admitting: *Deleted

## 2015-08-11 DIAGNOSIS — I4891 Unspecified atrial fibrillation: Secondary | ICD-10-CM

## 2015-08-11 DIAGNOSIS — Z5181 Encounter for therapeutic drug level monitoring: Secondary | ICD-10-CM | POA: Diagnosis not present

## 2015-08-11 DIAGNOSIS — Z7901 Long term (current) use of anticoagulants: Secondary | ICD-10-CM

## 2015-08-11 DIAGNOSIS — I480 Paroxysmal atrial fibrillation: Secondary | ICD-10-CM

## 2015-08-11 LAB — POCT INR: INR: 2.8

## 2015-09-08 ENCOUNTER — Ambulatory Visit (INDEPENDENT_AMBULATORY_CARE_PROVIDER_SITE_OTHER): Payer: Medicare Other | Admitting: *Deleted

## 2015-09-08 DIAGNOSIS — I480 Paroxysmal atrial fibrillation: Secondary | ICD-10-CM | POA: Diagnosis not present

## 2015-09-08 DIAGNOSIS — Z5181 Encounter for therapeutic drug level monitoring: Secondary | ICD-10-CM

## 2015-09-08 DIAGNOSIS — I4891 Unspecified atrial fibrillation: Secondary | ICD-10-CM | POA: Diagnosis not present

## 2015-09-08 DIAGNOSIS — Z7901 Long term (current) use of anticoagulants: Secondary | ICD-10-CM | POA: Diagnosis not present

## 2015-09-08 LAB — POCT INR: INR: 2.6

## 2015-10-20 ENCOUNTER — Ambulatory Visit (INDEPENDENT_AMBULATORY_CARE_PROVIDER_SITE_OTHER): Payer: Medicare Other | Admitting: *Deleted

## 2015-10-20 DIAGNOSIS — I4891 Unspecified atrial fibrillation: Secondary | ICD-10-CM | POA: Diagnosis not present

## 2015-10-20 DIAGNOSIS — Z5181 Encounter for therapeutic drug level monitoring: Secondary | ICD-10-CM

## 2015-10-20 DIAGNOSIS — I480 Paroxysmal atrial fibrillation: Secondary | ICD-10-CM | POA: Diagnosis not present

## 2015-10-20 DIAGNOSIS — Z7901 Long term (current) use of anticoagulants: Secondary | ICD-10-CM | POA: Diagnosis not present

## 2015-10-20 LAB — POCT INR: INR: 2.8

## 2015-11-24 ENCOUNTER — Other Ambulatory Visit (HOSPITAL_COMMUNITY): Payer: Self-pay | Admitting: Internal Medicine

## 2015-11-24 ENCOUNTER — Ambulatory Visit (HOSPITAL_COMMUNITY)
Admission: RE | Admit: 2015-11-24 | Discharge: 2015-11-24 | Disposition: A | Discharge status: Home / Self Care | Payer: Medicare Other | Source: Ambulatory Visit | Attending: Internal Medicine | Admitting: Internal Medicine

## 2015-11-24 DIAGNOSIS — R600 Localized edema: Secondary | ICD-10-CM | POA: Diagnosis not present

## 2015-11-29 ENCOUNTER — Other Ambulatory Visit: Payer: Self-pay | Admitting: Cardiovascular Disease

## 2015-12-01 ENCOUNTER — Ambulatory Visit (INDEPENDENT_AMBULATORY_CARE_PROVIDER_SITE_OTHER): Payer: Medicare Other | Admitting: *Deleted

## 2015-12-01 DIAGNOSIS — I4891 Unspecified atrial fibrillation: Secondary | ICD-10-CM | POA: Diagnosis not present

## 2015-12-01 DIAGNOSIS — I480 Paroxysmal atrial fibrillation: Secondary | ICD-10-CM | POA: Diagnosis not present

## 2015-12-01 DIAGNOSIS — Z5181 Encounter for therapeutic drug level monitoring: Secondary | ICD-10-CM

## 2015-12-01 DIAGNOSIS — Z7901 Long term (current) use of anticoagulants: Secondary | ICD-10-CM

## 2015-12-01 LAB — POCT INR: INR: 2.7

## 2015-12-20 ENCOUNTER — Other Ambulatory Visit: Payer: Self-pay | Admitting: Cardiovascular Disease

## 2016-01-12 ENCOUNTER — Ambulatory Visit (INDEPENDENT_AMBULATORY_CARE_PROVIDER_SITE_OTHER): Payer: Medicare Other | Admitting: *Deleted

## 2016-01-12 DIAGNOSIS — Z5181 Encounter for therapeutic drug level monitoring: Secondary | ICD-10-CM | POA: Diagnosis not present

## 2016-01-12 DIAGNOSIS — Z7901 Long term (current) use of anticoagulants: Secondary | ICD-10-CM

## 2016-01-12 DIAGNOSIS — I4891 Unspecified atrial fibrillation: Secondary | ICD-10-CM | POA: Diagnosis not present

## 2016-01-12 DIAGNOSIS — I480 Paroxysmal atrial fibrillation: Secondary | ICD-10-CM

## 2016-01-12 LAB — POCT INR: INR: 2.3

## 2016-01-17 ENCOUNTER — Encounter: Payer: Self-pay | Admitting: Internal Medicine

## 2016-02-14 ENCOUNTER — Ambulatory Visit (INDEPENDENT_AMBULATORY_CARE_PROVIDER_SITE_OTHER): Payer: Medicare Other | Admitting: Cardiovascular Disease

## 2016-02-14 ENCOUNTER — Encounter: Payer: Self-pay | Admitting: Cardiovascular Disease

## 2016-02-14 VITALS — BP 124/70 | HR 57 | Ht 72.0 in | Wt 225.0 lb

## 2016-02-14 DIAGNOSIS — Z7901 Long term (current) use of anticoagulants: Secondary | ICD-10-CM | POA: Diagnosis not present

## 2016-02-14 DIAGNOSIS — I480 Paroxysmal atrial fibrillation: Secondary | ICD-10-CM

## 2016-02-14 DIAGNOSIS — E7800 Pure hypercholesterolemia, unspecified: Secondary | ICD-10-CM | POA: Diagnosis not present

## 2016-02-14 DIAGNOSIS — G4733 Obstructive sleep apnea (adult) (pediatric): Secondary | ICD-10-CM

## 2016-02-14 DIAGNOSIS — I10 Essential (primary) hypertension: Secondary | ICD-10-CM | POA: Diagnosis not present

## 2016-02-14 NOTE — Progress Notes (Signed)
SUBJECTIVE: The patient presents for follow up of paroxysmal atrial fibrillation for which he takes warfarin, hypertension, hyperlipidemia, and sleep apnea for which he is on CPAP.  He seldom has palpitations for which he takes prn diltiazem. He denies exertional chest pain and shortness of breath.   ECG performed in the office today demonstrates sinus rhythm with frequent PVCs, right bundle branch block with left anterior fascicular block.   Review of Systems: As per "subjective", otherwise negative.  No Known Allergies  Current Outpatient Prescriptions  Medication Sig Dispense Refill  . chlorthalidone (HYGROTON) 25 MG tablet Take 0.5 tablets (12.5 mg total) by mouth daily. 45 tablet 11  . diltiazem (CARDIZEM) 30 MG tablet Take 1 tablet (30 mg total) by mouth daily as needed (Palpations). 30 tablet 11  . doxazosin (CARDURA) 4 MG tablet TAKE (3) TABLETS BY MOUTH ONCE DAILY. 90 tablet 3  . furosemide (LASIX) 20 MG tablet Take 20 mg by mouth as needed.    Marland Kitchen lisinopril (PRINIVIL,ZESTRIL) 40 MG tablet TAKE ONE TABLET BY MOUTH ONCE DAILY. 90 tablet 3  . polyethylene glycol powder (GLYCOLAX/MIRALAX) powder MIX 1 CAPFUL (17 GRAMS) WITH 8OZ OF WATER OR JUICE DAILY. 527 g 11  . potassium chloride SA (K-DUR,KLOR-CON) 20 MEQ tablet Take 20 mEq by mouth as needed.    . pravastatin (PRAVACHOL) 20 MG tablet Take 1 tablet (20 mg total) by mouth every evening. 90 tablet 3  . ranitidine (ZANTAC) 150 MG tablet Take 150 mg by mouth 2 (two) times daily.      . sitaGLIPtan-metformin (JANUMET) 50-1000 MG per tablet Take 1 tablet by mouth 2 (two) times daily with a meal.    . Triamcinolone Acetonide (NASACORT ALLERGY 24HR NA) Place 1 spray into the nose daily as needed (congestion).    . verapamil (CALAN-SR) 180 MG CR tablet TAKE (2) TABLETS BY MOUTH AT BEDTIME. 60 tablet 6  . warfarin (COUMADIN) 5 MG tablet TAKE 1 TABLET DAILY, EXCEPT 1 1/2 TABLETS ON TUESDAYS, THURSDAYS AND SATURDAYS. 45 tablet 3  .  Wheat Dextrin (BENEFIBER) POWD Take 1 scoop by mouth daily.      No current facility-administered medications for this visit.     Past Medical History:  Diagnosis Date  . Chronic anticoagulation   . Chronic back pain   . Diabetes mellitus, type 2 (HCC)    no insulin  . Edema   . GERD (gastroesophageal reflux disease)   . Hyperlipidemia   . Hypertension    normal coronary angiography and normal EF in 12/98  . 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 Paroxysmal atrial fibrillation ([place])    12/2005; [name]     Past Surgical History:  Procedure Laterality Date  . APPENDECTOMY  1998  . COLONOSCOPY  Never  . COLONOSCOPY N/A 11/26/2012   [id number] hemorrhoids. Pancolonic diverticulosis. Colonic polyps removed as described above. One hemostasis clip applied. TUBULAR ADENOMA. Surveillance Sept 2019.   . INSERTION OF MESH N/A 01/27/2013   Procedure: INSERTION OF MESH;  Surgeon: Jamesetta So, MD;  Location: [facility];  Service: General;  Laterality: N/A;  . TONSILLECTOMY     as a child  . UMBILICAL HERNIA REPAIR N/A 01/27/2013   Procedure: HERNIA REPAIR UMBILICAL ADULT;  Surgeon: Jamesetta So, MD;  Location: [facility];  Service: General;  Laterality: N/A;    Social History   Social History  .  Marital status: Married    Spouse name: N/A  . Number of children: N/A  . Years of education: N/A   Occupational History  . 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 Smoking status: Never Smoker  . Smokeless tobacco: Never Used  . Alcohol use No  . Drug use: No  . Sexual activity: Not on file   Other Topics Concern  . Not on file   Social History Narrative  . No narrative on file     Vitals:   02/14/16 1139  BP: 124/70  Pulse: (!) 57  SpO2: 98%  Weight: 225 lb (102.1 kg)  Height: 6' (1.829 m)    PHYSICAL EXAM General: NAD HEENT: Normal. Neck: No JVD, no  thyromegaly. Lungs: Clear to auscultation bilaterally with normal respiratory effort. CV: Nondisplaced PMI.  Regular rate and rhythm, normal S1/S2, no S3/S4, no murmur. No pretibial or periankle edema.  No carotid bruit.   Abdomen: Soft, nontender, no distention.  Neurologic: Alert and oriented.  Psych: Normal affect. Skin: Normal. Musculoskeletal: No gross deformities.    ECG: Most recent ECG reviewed.      ASSESSMENT AND PLAN: 1. Paroxysmal atrial fibrillation: Stable and maintaining sinus rhythm. He is tolerating warfarin without bleeding complications. I previously discussed alternative forms of anticoagulation with target specific oral anticoagulants, but he preferred to stay on warfarin. Continue verapamil and prn diltiazem.  2. Essential HTN: Controlled. No changes.  3. Hyperlipidemia: On pravastatin.   4. Sleep apnea: Using CPAP.   Dispo: f/u 1 year.   Kate Sable, M.D., F.A.C.C.

## 2016-02-14 NOTE — Patient Instructions (Signed)
Your physician wants you to follow-up in: 1 year Dr Koneswaran You will receive a reminder letter in the mail two months in advance. If you don't receive a letter, please call our office to schedule the follow-up appointment.     Your physician recommends that you continue on your current medications as directed. Please refer to the Current Medication list given to you today.    If you need a refill on your cardiac medications before your next appointment, please call your pharmacy.      Thank you for choosing [facility] !         

## 2016-02-15 ENCOUNTER — Encounter: Payer: Self-pay | Admitting: Internal Medicine

## 2016-02-15 ENCOUNTER — Ambulatory Visit (INDEPENDENT_AMBULATORY_CARE_PROVIDER_SITE_OTHER): Payer: Medicare Other | Admitting: Internal Medicine

## 2016-02-15 VITALS — BP 143/83 | HR 71 | Temp 97.3°F | Ht 72.0 in | Wt 226.0 lb

## 2016-02-15 DIAGNOSIS — K219 Gastro-esophageal reflux disease without esophagitis: Secondary | ICD-10-CM | POA: Diagnosis not present

## 2016-02-15 DIAGNOSIS — K5909 Other constipation: Secondary | ICD-10-CM | POA: Diagnosis not present

## 2016-02-15 NOTE — Patient Instructions (Signed)
Continue Miralax daily  Continue Benefiber daily  Office visit in 1 year

## 2016-02-16 NOTE — Progress Notes (Signed)
Primary Care Physician:  Glo Herring., MD Primary Gastroenterologist:  Dr. Gala Romney  Pre-Procedure History & Physical: HPI:  Riley [name] is a 70 y.o. male here for follow-up constipation GERD. GERD only intermittent. Responsive to ranitidine when necessary. Less than 3 episodes weekly. No dysphagia. Constipation well controlled on MiraLAX/Benefiber. No rectal bleeding. History of colonic adenoma; do surveillance examination 2019.  Past Medical History:  Diagnosis Date  . Chronic anticoagulation   . Chronic back pain   . Diabetes mellitus, type 2 (HCC)    no insulin  . Edema   . GERD (gastroesophageal reflux disease)   . Hyperlipidemia   . Hypertension    normal coronary angiography and normal EF in 12/98  . 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 Paroxysmal atrial fibrillation ([place])    12/2005; [name]     Past Surgical History:  Procedure Laterality Date  . APPENDECTOMY  1998  . COLONOSCOPY  Never  . COLONOSCOPY N/A 11/26/2012   [id number] 2 hemorrhoids. Pancolonic diverticulosis. Colonic polyps removed as described above. One hemostasis clip applied. TUBULAR ADENOMA. Surveillance Sept 2019.   . INSERTION OF MESH N/A 01/27/2013   Procedure: INSERTION OF MESH;  Surgeon: Jamesetta So, MD;  Location: [facility];  Service: General;  Laterality: N/A;  . TONSILLECTOMY     as a child  . UMBILICAL HERNIA REPAIR N/A 01/27/2013   Procedure: HERNIA REPAIR UMBILICAL ADULT;  Surgeon: Jamesetta So, MD;  Location: [facility];  Service: General;  Laterality: N/A;    Prior to Admission medications   Medication Sig Start Date End Date Taking? Authorizing Provider  chlorthalidone (HYGROTON) 25 MG tablet Take 0.5 tablets (12.5 mg total) by mouth daily. 12/23/14  Yes Herminio Commons, MD  diltiazem (CARDIZEM) 30 MG tablet Take 1 tablet (30 mg total) by mouth daily as needed (Palpations). 12/23/14  Yes Herminio Commons, MD    doxazosin (CARDURA) 4 MG tablet TAKE (3) TABLETS BY MOUTH ONCE DAILY. 11/29/15  Yes Herminio Commons, MD  furosemide (LASIX) 20 MG tablet Take 20 mg by mouth as needed.   Yes Historical Provider, MD  lisinopril (PRINIVIL,ZESTRIL) 40 MG tablet TAKE ONE TABLET BY MOUTH ONCE DAILY. 11/29/15  Yes Herminio Commons, MD  polyethylene glycol powder (GLYCOLAX/MIRALAX) powder MIX 1 CAPFUL (17 GRAMS) WITH 8OZ OF WATER OR JUICE DAILY. 01/25/15  Yes Mahala Menghini, PA-C  potassium chloride SA (K-DUR,KLOR-CON) 20 MEQ tablet Take 20 mEq by mouth as needed.   Yes Historical Provider, MD  pravastatin (PRAVACHOL) 20 MG tablet Take 1 tablet (20 mg total) by mouth every evening. 01/27/15  Yes Herminio Commons, MD  ranitidine (ZANTAC) 150 MG tablet Take 150 mg by mouth 2 (two) times daily.     Yes Historical Provider, MD  sitaGLIPtan-metformin (JANUMET) 50-1000 MG per tablet Take 1 tablet by mouth 2 (two) times daily with a meal.   Yes Historical Provider, MD  Triamcinolone Acetonide (NASACORT ALLERGY 24HR NA) Place 1 spray into the nose daily as needed (congestion).   Yes Historical Provider, MD  verapamil (CALAN-SR) 180 MG CR tablet TAKE (2) TABLETS BY MOUTH AT BEDTIME. 12/20/15  Yes Herminio Commons, MD  warfarin (COUMADIN) 5 MG tablet TAKE 1 TABLET DAILY, EXCEPT 1 1/2 TABLETS ON TUESDAYS, THURSDAYS AND SATURDAYS. 11/29/15  Yes Herminio Commons, MD  Wheat Dextrin (BENEFIBER) POWD Take 1 scoop by mouth daily.  Yes Historical Provider, MD    Allergies as of 02/15/2016  . (No Known Allergies)    Family History  Problem Relation Age of Onset  . Heart disease Father   . Heart disease Mother   . Allergies      whole family  . Bronchitis Mother   . Colon cancer Neg Hx     Social History   Social History  . Marital status: Married    Spouse name: N/A  . Number of children: N/A  . Years of education: N/A   Occupational History  . 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 Smoking status: Never Smoker  . Smokeless tobacco: Never Used  . Alcohol use No  . Drug use: No  . Sexual activity: Not on file   Other Topics Concern  . Not on file   Social History Narrative  . No narrative on file    Review of Systems: See HPI, otherwise negative ROS  Physical Exam: BP (!) 143/83   Pulse 71   Temp 97.3 F (36.3 C) (Oral)   Ht 6' (1.829 m)   Wt 226 lb (102.5 kg)   BMI 30.65 kg/m  General:   Alert,  Well-developed, well-nourished, pleasant and cooperative in NAD Skin:  Intact without significant lesions or rashes. Neck:  Supple; no masses or thyromegaly. No significant cervical adenopathy. Lungs:  Clear throughout to auscultation.   No wheezes, crackles, or rhonchi. No acute distress. Heart:  Regular rate and rhythm; no murmurs, clicks, rubs,  or gallops. Abdomen: Non-distended, normal bowel sounds.  Soft and nontender without appreciable mass or hepatosplenomegaly.  Pulses:  Normal pulses noted. Extremities:  Without clubbing or edema.  Impression:  Overall, 70 year old gentleman with the occasional GERD well controlled on ranitidine without any alarm symptoms. Constipation well-managed on MiraLAX/Benefiber. History of colonic adenoma; dispense colonoscopy 2019.  Recommendations:  Continue Miralax daily  Continue Benefiber daily  Office visit in 1 year   Notice: This dictation was prepared with Dragon dictation along with smaller phrase technology. Any transcriptional errors that result from this process are unintentional and may not be corrected upon review.

## 2016-02-21 ENCOUNTER — Other Ambulatory Visit: Payer: Self-pay | Admitting: Cardiovascular Disease

## 2016-02-22 ENCOUNTER — Ambulatory Visit (INDEPENDENT_AMBULATORY_CARE_PROVIDER_SITE_OTHER): Payer: Medicare Other | Admitting: Pulmonary Disease

## 2016-02-22 ENCOUNTER — Encounter: Payer: Self-pay | Admitting: Pulmonary Disease

## 2016-02-22 DIAGNOSIS — G4733 Obstructive sleep apnea (adult) (pediatric): Secondary | ICD-10-CM

## 2016-02-22 DIAGNOSIS — I10 Essential (primary) hypertension: Secondary | ICD-10-CM

## 2016-02-22 NOTE — Patient Instructions (Signed)
CPAP is working well and is set at 11 cm CPAP supplies will be renewed for a year  If cough persists for another month, discuss stopping lisinopril with  PCP

## 2016-02-22 NOTE — Addendum Note (Signed)
Addended by: Valerie Salts on: 02/22/2016 03:20 PM   Modules accepted: Orders

## 2016-02-22 NOTE — Progress Notes (Signed)
   Subjective:    Patient ID: IDRISSA MATTUCCI, male    DOB: 04/25/1945, 70 y.o.   MRN: [id number]  HPI  70 yo  never smoker with atrial fibrillation for FU of obstructive sleep apnea.  zem as needed for palpitations to avoid ER visits. Takes Nasonex for nasal stuffiness.  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   02/22/2016  Chief Complaint  Patient presents with  . Sleep Apnea    1 year follow up. Machine has been working well for him. No breathing issues.    He developed a lower respiratory tract infection with cough and was unable to see his CPAP but is now back on it. He still has residual upper airway cough with constant throat clearing Medication review shows lisinopril  Otherwise CPAP has been working okay, he is maintained on 11 cm since 2014 with good results and improvement in his daytime somnolence and fatigue. He is changed to nasal mask and this has really helped his compliance  Download from 2016 shows good control of events on 11 cm with good compliance   09/2012 PSG >>He did not sleep well- TST 128 mins - only 2-3 hrs of sleep -RDI 37/h Started on autoCPAP 5-15  >> changed to 11 cm     Review of Systems neg for any significant sore throat, dysphagia, itching, sneezing, nasal congestion or excess/ purulent secretions, fever, chills, sweats, unintended wt loss, pleuritic or exertional cp, hempoptysis, orthopnea pnd or change in chronic leg swelling. Also denies presyncope, palpitations, heartburn, abdominal pain, nausea, vomiting, diarrhea or change in bowel or urinary habits, dysuria,hematuria, rash, arthralgias, visual complaints, headache, numbness weakness or ataxia.     Objective:   Physical Exam  Gen. Pleasant, well-nourished, in no distress ENT - no lesions, no post nasal drip Neck: No JVD, no thyromegaly, no carotid bruits Lungs: no use of accessory muscles, no dullness to percussion, clear without rales or rhonchi  Cardiovascular:  Rhythm regular, heart sounds  normal, no murmurs or gallops, no peripheral edema Musculoskeletal: No deformities, no cyanosis or clubbing        Assessment & Plan:

## 2016-02-22 NOTE — Assessment & Plan Note (Signed)
CPAP is working well and is set at 11 cm CPAP supplies will be renewed for a year  Weight loss encouraged, compliance with goal of at least 4-6 hrs every night is the expectation. Advised against medications with sedative side effects Cautioned against driving when sleepy - understanding that sleepiness will vary on a day to day basis

## 2016-02-22 NOTE — Assessment & Plan Note (Signed)
Upper airway cough may be related to lisinopril would is more likely due to recent upper respiratory tract infection

## 2016-02-23 ENCOUNTER — Ambulatory Visit (INDEPENDENT_AMBULATORY_CARE_PROVIDER_SITE_OTHER): Payer: Medicare Other | Admitting: *Deleted

## 2016-02-23 DIAGNOSIS — I480 Paroxysmal atrial fibrillation: Secondary | ICD-10-CM | POA: Diagnosis not present

## 2016-02-23 DIAGNOSIS — Z5181 Encounter for therapeutic drug level monitoring: Secondary | ICD-10-CM | POA: Diagnosis not present

## 2016-02-23 DIAGNOSIS — I4891 Unspecified atrial fibrillation: Secondary | ICD-10-CM

## 2016-02-23 DIAGNOSIS — Z7901 Long term (current) use of anticoagulants: Secondary | ICD-10-CM

## 2016-02-23 LAB — POCT INR: INR: 2.6

## 2016-03-17 ENCOUNTER — Other Ambulatory Visit: Payer: Self-pay | Admitting: Gastroenterology

## 2016-03-17 ENCOUNTER — Other Ambulatory Visit: Payer: Self-pay | Admitting: Cardiovascular Disease

## 2016-04-05 ENCOUNTER — Ambulatory Visit (INDEPENDENT_AMBULATORY_CARE_PROVIDER_SITE_OTHER): Payer: Medicare Other | Admitting: *Deleted

## 2016-04-05 DIAGNOSIS — Z5181 Encounter for therapeutic drug level monitoring: Secondary | ICD-10-CM

## 2016-04-05 DIAGNOSIS — I480 Paroxysmal atrial fibrillation: Secondary | ICD-10-CM

## 2016-04-05 LAB — POCT INR: INR: 3

## 2016-05-01 ENCOUNTER — Other Ambulatory Visit: Payer: Self-pay | Admitting: Cardiovascular Disease

## 2016-05-17 ENCOUNTER — Ambulatory Visit (INDEPENDENT_AMBULATORY_CARE_PROVIDER_SITE_OTHER): Payer: Medicare Other | Admitting: *Deleted

## 2016-05-17 DIAGNOSIS — I480 Paroxysmal atrial fibrillation: Secondary | ICD-10-CM | POA: Diagnosis not present

## 2016-05-17 DIAGNOSIS — Z5181 Encounter for therapeutic drug level monitoring: Secondary | ICD-10-CM

## 2016-05-17 LAB — POCT INR: INR: 3

## 2016-06-05 ENCOUNTER — Other Ambulatory Visit: Payer: Self-pay | Admitting: Cardiovascular Disease

## 2016-06-19 ENCOUNTER — Other Ambulatory Visit: Payer: Self-pay | Admitting: Cardiovascular Disease

## 2016-06-28 ENCOUNTER — Ambulatory Visit (INDEPENDENT_AMBULATORY_CARE_PROVIDER_SITE_OTHER): Payer: Medicare Other | Admitting: *Deleted

## 2016-06-28 DIAGNOSIS — I480 Paroxysmal atrial fibrillation: Secondary | ICD-10-CM

## 2016-06-28 DIAGNOSIS — Z5181 Encounter for therapeutic drug level monitoring: Secondary | ICD-10-CM | POA: Diagnosis not present

## 2016-06-28 LAB — POCT INR: INR: 3

## 2016-08-09 ENCOUNTER — Ambulatory Visit (INDEPENDENT_AMBULATORY_CARE_PROVIDER_SITE_OTHER): Payer: Medicare Other | Admitting: *Deleted

## 2016-08-09 DIAGNOSIS — I480 Paroxysmal atrial fibrillation: Secondary | ICD-10-CM

## 2016-08-09 DIAGNOSIS — Z5181 Encounter for therapeutic drug level monitoring: Secondary | ICD-10-CM | POA: Diagnosis not present

## 2016-08-09 LAB — POCT INR: INR: 2.3

## 2016-08-09 MED ORDER — PRAVASTATIN SODIUM 20 MG PO TABS: 20.0000 mg | ORAL_TABLET | Freq: Every evening | ORAL | 3 refills | Status: DC

## 2016-08-14 ENCOUNTER — Other Ambulatory Visit: Payer: Self-pay | Admitting: Cardiovascular Disease

## 2016-08-21 ENCOUNTER — Other Ambulatory Visit: Payer: Self-pay | Admitting: Cardiovascular Disease

## 2016-09-14 ENCOUNTER — Other Ambulatory Visit: Payer: Self-pay | Admitting: Cardiovascular Disease

## 2016-09-20 ENCOUNTER — Ambulatory Visit (INDEPENDENT_AMBULATORY_CARE_PROVIDER_SITE_OTHER): Payer: Medicare Other | Admitting: *Deleted

## 2016-09-20 DIAGNOSIS — Z5181 Encounter for therapeutic drug level monitoring: Secondary | ICD-10-CM | POA: Diagnosis not present

## 2016-09-20 DIAGNOSIS — I480 Paroxysmal atrial fibrillation: Secondary | ICD-10-CM | POA: Diagnosis not present

## 2016-09-20 LAB — POCT INR: INR: 2.5

## 2016-10-02 ENCOUNTER — Other Ambulatory Visit: Payer: Self-pay | Admitting: Gastroenterology

## 2016-10-19 ENCOUNTER — Encounter: Payer: Self-pay | Admitting: *Deleted

## 2016-10-20 ENCOUNTER — Encounter: Payer: Self-pay | Admitting: Adult Health

## 2016-10-20 ENCOUNTER — Ambulatory Visit (INDEPENDENT_AMBULATORY_CARE_PROVIDER_SITE_OTHER): Payer: Medicare Other | Admitting: Adult Health

## 2016-10-20 ENCOUNTER — Encounter: Payer: Self-pay | Admitting: *Deleted

## 2016-10-20 VITALS — BP 158/88 | HR 68 | Ht 73.0 in | Wt 225.0 lb

## 2016-10-20 DIAGNOSIS — I480 Paroxysmal atrial fibrillation: Secondary | ICD-10-CM | POA: Diagnosis not present

## 2016-10-20 DIAGNOSIS — I10 Essential (primary) hypertension: Secondary | ICD-10-CM

## 2016-10-20 DIAGNOSIS — R0789 Other chest pain: Secondary | ICD-10-CM

## 2016-10-20 DIAGNOSIS — E7800 Pure hypercholesterolemia, unspecified: Secondary | ICD-10-CM | POA: Diagnosis not present

## 2016-10-20 NOTE — Progress Notes (Signed)
Cardiology Office Note   Date:  10/20/2016   ID:  Riley [name], DOB 01/06/1946, MRN [id number]  PCP:  Redmond School, MD  Cardiologist:  Bronson Ing  Chief Complaint  Patient presents with  . Atrial Fibrillation    PAF  . Hypertension      History of Present Illness: Riley [name] is a 71 y.o. male who presents for ongoing assessment and management of paroxysmal atrial fibrillation, on Coumadin therapy followed by our Coumadin clinic, other history includes hypertension, hyperlipidemia, and sleep apnea on CPAP. 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 He  was last seen in the office on 02/14/2016 and is here for ongoing annual follow-up  He is complaining of intermittent chest burning usually first thing in the morning. He uses a CPAP at night due to obstructive sleep apnea and is stop using humidifier as it makes his nose run and he has to blow his nose a lot the following morning. Since he stopped using him a fire he is been noticing a lot of pressure in his face with pain in his chest radiating down to both arms. Once he stopped using CPAP for a few days his symptoms were relieved.  He is medically compliant. He has not had any lab work completed by his primary care physician in greater than one year. He is consistent concerning PT/INR checks with our Coumadin clinic.  Past Medical History:  Diagnosis Date  . Chronic anticoagulation   . Chronic back pain   . Diabetes mellitus, type 2 (HCC)    no insulin  . Edema   . GERD (gastroesophageal reflux disease)   . Hyperlipidemia   . Hypertension    normal coronary angiography and normal EF in 12/98  . 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 Paroxysmal atrial fibrillation ([employer])    12/2005; [name]     Past Surgical History:  Procedure Laterality Date  . APPENDECTOMY  1998  . COLONOSCOPY  Never  . COLONOSCOPY N/A  11/26/2012   CZY:SAYTK 2 hemorrhoids. Pancolonic diverticulosis. Colonic polyps removed as described above. One hemostasis clip applied. TUBULAR ADENOMA. Surveillance Sept 2019.   . INSERTION OF MESH N/A 01/27/2013   Procedure: INSERTION OF MESH;  Surgeon: Jamesetta So, MD;  Location: [facility];  Service: General;  Laterality: N/A;  . TONSILLECTOMY     as a child  . UMBILICAL HERNIA REPAIR N/A 01/27/2013   Procedure: HERNIA REPAIR UMBILICAL ADULT;  Surgeon: Jamesetta So, MD;  Location: [facility];  Service: General;  Laterality: N/A;     Current Outpatient Prescriptions  Medication Sig Dispense Refill  . chlorthalidone (HYGROTON) 25 MG tablet TAKE (1/2) TABLET BY MOUTH ONCE DAILY. 45 tablet 3  . diltiazem (CARDIZEM) 30 MG tablet Take 1 tablet (30 mg total) by mouth daily as needed (Palpations). 30 tablet 11  . doxazosin (CARDURA) 4 MG tablet TAKE (3) TABLETS BY MOUTH ONCE DAILY. 90 tablet 6  . furosemide (LASIX) 20 MG tablet Take 20 mg by mouth as needed.    Marland Kitchen lisinopril (PRINIVIL,ZESTRIL) 40 MG tablet TAKE ONE TABLET BY MOUTH ONCE DAILY. 90 tablet 3  . polyethylene glycol powder (GLYCOLAX/MIRALAX) powder MIX 1 CAPFUL (17 GRAMS) WITH 8OZ OF WATER OR JUICE DAILY. 527 g 3  . potassium chloride SA (K-DUR,KLOR-CON) 20 MEQ tablet Take 20 mEq by mouth as needed.    . pravastatin (  PRAVACHOL) 20 MG tablet Take 1 tablet (20 mg total) by mouth every evening. 90 tablet 3  . ranitidine (ZANTAC) 150 MG tablet Take 150 mg by mouth 2 (two) times daily.      . sitaGLIPtan-metformin (JANUMET) 50-1000 MG per tablet Take 1 tablet by mouth 2 (two) times daily with a meal.    . Triamcinolone Acetonide (NASACORT ALLERGY 24HR NA) Place 1 spray into the nose daily as needed (congestion).    . verapamil (CALAN-SR) 180 MG CR tablet TAKE (2) TABLETS BY MOUTH AT BEDTIME. 60 tablet 6  . warfarin (COUMADIN) 5 MG tablet TAKE 1 TABLET DAILY, EXCEPT 1 1/2 TABLETS ON TUESDAYS, THURSDAYS AND SATURDAYS. 45 tablet 6  . Wheat  Dextrin (BENEFIBER) POWD Take 1 scoop by mouth daily.      No current facility-administered medications for this visit.     Allergies:   Patient has no known allergies.    Social History:  The patient  reports that he has never smoked. He has never used smokeless tobacco. He reports that he does not drink alcohol or use drugs.   Family History:  The patient's family history includes Allergies in his unknown relative; Bronchitis in his mother; Heart disease in his father and mother.    ROS: All other systems are reviewed and negative. Unless otherwise mentioned in H&P    PHYSICAL EXAM: VS:  BP (!) 158/88   Pulse 68   Ht 6\' 1"  (1.854 m)   Wt 225 lb (102.1 kg)   SpO2 96% Comment: on room air  BMI 29.69 kg/m  , BMI Body mass index is 29.69 kg/m. GEN: Well nourished, well developed, in no acute distress  HEENT: normal  Neck: no JVD, carotid bruits, or masses Cardiac: IRRR; no murmurs, rubs, or gallops,no edema  Respiratory:  clear to auscultation bilaterally, normal work of breathing GI: soft, nontender, nondistended, + BS MS: no deformity or atrophy Significant bruising on his lower legs bilaterally. Skin: warm and dry, no rash Neuro:  Strength and sensation are intact Psych: euthymic mood, full affect   EKG:  Sinus rhythm with PVCs, incomplete right bundle branch block with left anterior fascicular block. Heart rate of 72 bpm. Nonspecific ST-T wave abnormalities.    Lipid Panel    Component Value Date/Time   CHOL 98 06/21/2009 1858   TRIG 93 06/21/2009 1858   TRIG 92 and 07/30/2006   HDL 37 (L) 06/21/2009 1858   CHOLHDL 2.6 Ratio 06/21/2009 1858   VLDL 19 06/21/2009 1858   LDLCALC 42 06/21/2009 1858   LDLCALC 48 07/30/2006      Wt Readings from Last 3 Encounters:  10/20/16 225 lb (102.1 kg)  02/22/16 232 lb 6.4 oz (105.4 kg)  02/15/16 226 lb (102.5 kg)      Other studies Reviewed: Left ventricle: The cavity size was normal. Wall thickness was increased in  a pattern of moderate LVH. Systolic function was normal. The estimated ejection fraction was in the range of 60% to 65%. Wall motion was normal; there were no regional wall motion abnormalities. The study is not technically sufficient to allow evaluation of LV diastolic function. Atrial flutter with variable block noted during the study. No previous exam for comparison. - Mitral valve: Trivial regurgitation. - Left atrium: The atrium was mildly dilated. - Right ventricle: The cavity size was mildly dilated with prominent apical trabeculation. The moderator band was prominent. Systolic function was normal. - Tricuspid valve: Trivial regurgitation. - Pulmonary arteries: Systolic pressure could not  be accurately estimated. - Inferior vena cava: Not well visualized. - Pericardium, extracardiac: There was no pericardial effusion.  ASSESSMENT AND PLAN:  1. Paroxysmal atrial fibrillation: Currently in sinus rhythm with PVCs. He remains on Coumadin therapy and is followed by our Coumadin clinic. I'm good order a CBC for evaluation for anemia on chronic anticoagulation. Heart rate is well controlled currently on verapamil. He has not had to take any additional doses of diltiazem except for once or twice over the last year.  2. Chest pain: Appears atypical and associated with use of CPAP without humidifying mist. If he uses humidifying mist with the CPAP he does not have discomfort in his chest. He describes it as a burning feeling down his throat into his chest abdomen and down both arms. It is usually in the morning. The patient has stopped using the CPAP temporarily and states that discomfort in his chest is gone away but he continues to have generalized fatigue. I'm good order a GXT stress test for diagnostic prognostic purposes. I'm good order a BMET, TSH, lipids and LFTs.  3. Hypertension: Blood pressure is slightly elevated today he states that it is normally much lower at  home but he is also not worn his CPAP for couple of days. We'll follow  4. Obstructive sleep apnea: He is been having issues with the CPAP. If he uses humidifier he often has sinus congestion and has to blow his nose, if he does not he feels some burning in his chest abdomen and both arms. He is to follow with Dr.Alva in October. I've asked him to discuss these issues with him.  5. Diabetes: He states his blood glucoses did fine throughout the year. I'll go ahead and order hemoglobin A1c all of these labs will be sent to primary care.  Current medicines are reviewed at length with the patient today.    Labs/ tests ordered today include: Stress GXT Phill Myron. [place] Pugh, ANP, AACC   10/20/2016 3:46 PM    [facility] [address] [place], [place] [address] Phone: [phone]; Fax: [phone]

## 2016-10-20 NOTE — Patient Instructions (Addendum)
Medication Instructions:  Your physician recommends that you continue on your current medications as directed. Please refer to the Current Medication list given to you today.   Labwork: NONE   Testing/Procedures: Your physician has requested that you have an exercise tolerance test. For further information please visit [email or web address]. Please also follow instruction sheet, as given.   Follow-Up: Your physician wants you to follow-up in: 6 Months.  You will receive a reminder letter in the mail two months in advance. If you don't receive a letter, please call our office to schedule the follow-up appointment.   Any Other Special Instructions Will Be Listed Below (If Applicable).     If you need a refill on your cardiac medications before your next appointment, please call your pharmacy.  Thank you male

## 2016-11-01 ENCOUNTER — Ambulatory Visit (INDEPENDENT_AMBULATORY_CARE_PROVIDER_SITE_OTHER): Payer: Medicare Other | Admitting: *Deleted

## 2016-11-01 ENCOUNTER — Ambulatory Visit (HOSPITAL_COMMUNITY): Admission: RE | Admit: 2016-11-01 | Payer: Medicare Other | Source: Ambulatory Visit

## 2016-11-01 ENCOUNTER — Telehealth: Payer: Self-pay

## 2016-11-01 DIAGNOSIS — I480 Paroxysmal atrial fibrillation: Secondary | ICD-10-CM

## 2016-11-01 DIAGNOSIS — R079 Chest pain, unspecified: Secondary | ICD-10-CM

## 2016-11-01 DIAGNOSIS — Z5181 Encounter for therapeutic drug level monitoring: Secondary | ICD-10-CM

## 2016-11-01 LAB — MAGNESIUM: Magnesium: 1.7 mg/dL (ref 1.5–2.5)

## 2016-11-01 LAB — CBC WITH DIFFERENTIAL/PLATELET
BASOS ABS: 60 {cells}/uL (ref 0–200)
Basophils Relative: 1 %
EOS ABS: 60 {cells}/uL (ref 15–500)
Eosinophils Relative: 1 %
HCT: 43.6 % (ref 38.5–50.0)
Hemoglobin: 14.4 g/dL (ref 13.2–17.1)
LYMPHS PCT: 24 %
Lymphs Abs: 1440 cells/uL (ref 850–3900)
MCH: 30.2 pg (ref 27.0–33.0)
MCHC: 33 g/dL (ref 32.0–36.0)
MCV: 91.4 fL (ref 80.0–100.0)
MONOS PCT: 12 %
MPV: 10.6 fL (ref 7.5–12.5)
Monocytes Absolute: 720 cells/uL (ref 200–950)
Neutro Abs: 3720 cells/uL (ref 1500–7800)
Neutrophils Relative %: 62 %
PLATELETS: 184 10*3/uL (ref 140–400)
RBC: 4.77 MIL/uL (ref 4.20–5.80)
RDW: 14.2 % (ref 11.0–15.0)
WBC: 6 10*3/uL (ref 3.8–10.8)

## 2016-11-01 LAB — LIPID PANEL
CHOLESTEROL: 133 mg/dL (ref <200)
HDL: 40 mg/dL — ABNORMAL LOW (ref >40)
LDL Cholesterol: 68 mg/dL (ref <100)
TRIGLYCERIDES: 125 mg/dL (ref <150)
Total CHOL/HDL Ratio: 3.3 Ratio (ref <5.0)
VLDL: 25 mg/dL (ref <30)

## 2016-11-01 LAB — COMPREHENSIVE METABOLIC PANEL
ALT: 18 U/L (ref 9–46)
AST: 14 U/L (ref 10–35)
Albumin: 4.4 g/dL (ref 3.6–5.1)
Alkaline Phosphatase: 45 U/L (ref 40–115)
BUN: 27 mg/dL — ABNORMAL HIGH (ref 7–25)
CHLORIDE: 103 mmol/L (ref 98–110)
CO2: 26 mmol/L (ref 20–32)
Calcium: 9.2 mg/dL (ref 8.6–10.3)
Creat: 1.17 mg/dL (ref 0.70–1.25)
GLUCOSE: 114 mg/dL — AB (ref 65–99)
Potassium: 3.9 mmol/L (ref 3.5–5.3)
Sodium: 141 mmol/L (ref 135–146)
TOTAL PROTEIN: 6.4 g/dL (ref 6.1–8.1)
Total Bilirubin: 0.6 mg/dL (ref 0.2–1.2)

## 2016-11-01 LAB — POCT INR: INR: 2.4

## 2016-11-01 NOTE — Telephone Encounter (Signed)
-----   Message from Orinda Kenner sent at 11/01/2016  9:49 AM EDT ----- Regarding: Order Selinda Eon changed this patient to a lexiscan myoview. Will you put the order in and print out an instruction sheet for me please?  Thanks, [employer]

## 2016-11-02 ENCOUNTER — Telehealth: Payer: Self-pay | Admitting: *Deleted

## 2016-11-02 LAB — TSH: TSH: 1.59 mIU/L (ref 0.40–4.50)

## 2016-11-02 LAB — HEMOGLOBIN A1C
HEMOGLOBIN A1C: 6 % — AB (ref <5.7)
MEAN PLASMA GLUCOSE: 126 mg/dL

## 2016-11-02 MED ORDER — MAGNESIUM OXIDE -MG SUPPLEMENT 200 MG PO TABS: 200.0000 mg | ORAL_TABLET | Freq: Every day | ORAL | 11 refills | Status: DC

## 2016-11-02 NOTE — Telephone Encounter (Signed)
-----   Message from Lendon Colonel, NP sent at 11/02/2016  7:07 AM EDT ----- Labs reviewed. Hgb A1C is still elevated but better than previous. Magnesium is 1.7. Will need to be 2.0 for cardiac patients. Please begin magnesium 200 mg daily.

## 2016-11-09 ENCOUNTER — Encounter (HOSPITAL_BASED_OUTPATIENT_CLINIC_OR_DEPARTMENT_OTHER)
Admission: RE | Admit: 2016-11-09 | Discharge: 2016-11-09 | Disposition: A | Discharge status: Home / Self Care | Payer: Medicare Other | Source: Ambulatory Visit | Attending: Physician Assistant | Admitting: Physician Assistant

## 2016-11-09 ENCOUNTER — Ambulatory Visit (HOSPITAL_COMMUNITY)
Admission: RE | Admit: 2016-11-09 | Discharge: 2016-11-09 | Disposition: A | Discharge status: Home / Self Care | Payer: Medicare Other | Source: Ambulatory Visit | Attending: Adult Health | Admitting: Adult Health

## 2016-11-09 ENCOUNTER — Encounter (HOSPITAL_COMMUNITY): Payer: Self-pay

## 2016-11-09 DIAGNOSIS — E7800 Pure hypercholesterolemia, unspecified: Secondary | ICD-10-CM | POA: Diagnosis present

## 2016-11-09 DIAGNOSIS — R079 Chest pain, unspecified: Secondary | ICD-10-CM | POA: Diagnosis not present

## 2016-11-09 DIAGNOSIS — I480 Paroxysmal atrial fibrillation: Secondary | ICD-10-CM | POA: Insufficient documentation

## 2016-11-09 LAB — NM MYOCAR MULTI W/SPECT W/WALL MOTION / EF
CHL CUP NUCLEAR SDS: 0
CHL CUP NUCLEAR SRS: 1
CHL CUP RESTING HR STRESS: 61 {beats}/min
CSEPPHR: 82 {beats}/min
LV dias vol: 163 mL (ref 62–150)
LV sys vol: 79 mL
RATE: 0.39
SSS: 1
TID: 1.09

## 2016-11-09 MED ORDER — REGADENOSON 0.4 MG/5ML IV SOLN
INTRAVENOUS | Status: AC
  Administered 2016-11-09: 0.4 mg via INTRAVENOUS
  Filled 2016-11-09: qty 5

## 2016-11-09 MED ORDER — SODIUM CHLORIDE 0.9% FLUSH
INTRAVENOUS | Status: AC
  Administered 2016-11-09: 10 mL via INTRAVENOUS
  Filled 2016-11-09: qty 10

## 2016-11-09 MED ORDER — TECHNETIUM TC 99M TETROFOSMIN IV KIT
30.0000 | PACK | Freq: Once | INTRAVENOUS | Status: AC | PRN
  Administered 2016-11-09: 31 via INTRAVENOUS

## 2016-11-09 MED ORDER — TECHNETIUM TC 99M TETROFOSMIN IV KIT
10.0000 | PACK | Freq: Once | INTRAVENOUS | Status: AC | PRN
  Administered 2016-11-09: 10 via INTRAVENOUS

## 2016-11-23 ENCOUNTER — Other Ambulatory Visit: Payer: Self-pay | Admitting: Cardiovascular Disease

## 2016-11-29 ENCOUNTER — Telehealth: Payer: Self-pay | Admitting: *Deleted

## 2016-11-29 NOTE — Telephone Encounter (Signed)
Appt moved from 10/10 to 61/84 due to pt conflict.

## 2016-11-29 NOTE — Telephone Encounter (Signed)
Pt's wife is having knee surgery on the 8th, they're not sure if he will be able to come to his apt on the 10th.

## 2016-12-18 ENCOUNTER — Ambulatory Visit (INDEPENDENT_AMBULATORY_CARE_PROVIDER_SITE_OTHER): Payer: Medicare Other | Admitting: *Deleted

## 2016-12-18 DIAGNOSIS — I480 Paroxysmal atrial fibrillation: Secondary | ICD-10-CM

## 2016-12-18 DIAGNOSIS — Z5181 Encounter for therapeutic drug level monitoring: Secondary | ICD-10-CM | POA: Diagnosis not present

## 2016-12-18 LAB — POCT INR: INR: 4.2

## 2017-01-12 ENCOUNTER — Ambulatory Visit (INDEPENDENT_AMBULATORY_CARE_PROVIDER_SITE_OTHER): Payer: Medicare Other | Admitting: *Deleted

## 2017-01-12 DIAGNOSIS — Z5181 Encounter for therapeutic drug level monitoring: Secondary | ICD-10-CM

## 2017-01-12 DIAGNOSIS — I480 Paroxysmal atrial fibrillation: Secondary | ICD-10-CM | POA: Diagnosis not present

## 2017-01-12 LAB — POCT INR: INR: 2.7

## 2017-01-18 ENCOUNTER — Encounter: Payer: Self-pay | Admitting: Internal Medicine

## 2017-02-15 ENCOUNTER — Ambulatory Visit: Payer: Medicare Other | Admitting: Adult Health

## 2017-02-16 ENCOUNTER — Ambulatory Visit (INDEPENDENT_AMBULATORY_CARE_PROVIDER_SITE_OTHER): Payer: Medicare Other | Admitting: *Deleted

## 2017-02-16 DIAGNOSIS — I480 Paroxysmal atrial fibrillation: Secondary | ICD-10-CM | POA: Diagnosis not present

## 2017-02-16 DIAGNOSIS — Z5181 Encounter for therapeutic drug level monitoring: Secondary | ICD-10-CM

## 2017-02-16 LAB — POCT INR: INR: 3

## 2017-02-19 ENCOUNTER — Other Ambulatory Visit: Payer: Self-pay | Admitting: Cardiovascular Disease

## 2017-03-12 ENCOUNTER — Other Ambulatory Visit: Payer: Self-pay | Admitting: Cardiovascular Disease

## 2017-03-13 ENCOUNTER — Encounter: Payer: Self-pay | Admitting: Adult Health

## 2017-03-14 ENCOUNTER — Ambulatory Visit: Payer: Medicare Other | Admitting: Adult Health

## 2017-03-14 ENCOUNTER — Encounter: Payer: Self-pay | Admitting: Adult Health

## 2017-03-14 DIAGNOSIS — G4733 Obstructive sleep apnea (adult) (pediatric): Secondary | ICD-10-CM | POA: Diagnosis not present

## 2017-03-14 NOTE — Patient Instructions (Signed)
Continue on CPAP At bedtime   Wear for at least 6hrs each night.  Work on healthy weight .  Do not drive if sleepy.  Follow up Dr. Elsworth Soho  In 1 year and As needed

## 2017-03-14 NOTE — Assessment & Plan Note (Signed)
Compensated on CPAP   Plan  Patient Instructions  Continue on CPAP At bedtime   Wear for at least 6hrs each night.  Work on healthy weight .  Do not drive if sleepy.  Follow up Dr. Elsworth Soho  In 1 year and As needed

## 2017-03-14 NOTE — Assessment & Plan Note (Signed)
Wt loss  

## 2017-03-14 NOTE — Progress Notes (Signed)
@Patient  ID: Riley [name], male    DOB: 1946-01-08, 71 y.o.   MRN: [id number]  Chief Complaint  Patient presents with  . Follow-up    OSA    Referring provider: Redmond School, MD  HPI: 72 yo male followed for OSA  Has A Fib   TEST  2014 PSG >>He did not sleep well- TST 128 mins - only 2-3 hrs of sleep -RDI 37/h Started on autoCPAP 5-15  >> changed to 11 cm  03/14/2017 Follow up : OSA  Pt presents for 1 year follow up for Sleep Apnea . He says he is doing well on CPAP . Feels rested w/ no significant daytime sleepiness . Sleeps great , does not go without it.  Download shows excellent compliance with avg usage with 6 hr . AHI 3.2.  Discussed healthy weight .     No Known Allergies  Immunization History  Administered Date(s) Administered  . Influenza Whole 12/06/2011  . Influenza, High Dose Seasonal PF 12/03/2016  . Influenza-Unspecified 01/15/2014  . Pneumococcal Polysaccharide-23 05/01/2012    Past Medical History:  Diagnosis Date  . Chronic anticoagulation   . Chronic back pain   . Diabetes mellitus, type 2 (HCC)    no insulin  . Edema   . GERD (gastroesophageal reflux disease)   . Hyperlipidemia   . Hypertension    normal coronary angiography and normal EF in 12/98  . 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 Paroxysmal atrial fibrillation ([place])    12/2005; [name]     Tobacco History: Social History   Tobacco Use  Smoking Status Never Smoker  Smokeless Tobacco Never Used   Counseling given: Not Answered   Outpatient Encounter Medications as of 03/14/2017  Medication Sig  . chlorthalidone (HYGROTON) 25 MG tablet TAKE (1/2) TABLET BY MOUTH ONCE DAILY.  Marland Kitchen diltiazem (CARDIZEM) 30 MG tablet Take 1 tablet (30 mg total) by mouth daily as needed (Palpations).  . doxazosin (CARDURA) 4 MG tablet TAKE (3) TABLETS BY MOUTH ONCE DAILY.  . furosemide (LASIX) 20 MG tablet Take 20 mg by mouth as  needed.  Marland Kitchen lisinopril (PRINIVIL,ZESTRIL) 40 MG tablet TAKE ONE TABLET BY MOUTH ONCE DAILY.  . Magnesium Oxide 200 MG TABS Take 1 tablet (200 mg total) by mouth daily.  . polyethylene glycol powder (GLYCOLAX/MIRALAX) powder MIX 1 CAPFUL (17 GRAMS) WITH 8OZ OF WATER OR JUICE DAILY.  Marland Kitchen potassium chloride SA (K-DUR,KLOR-CON) 20 MEQ tablet Take 20 mEq by mouth as needed.  . pravastatin (PRAVACHOL) 20 MG tablet Take 1 tablet (20 mg total) by mouth every evening.  . ranitidine (ZANTAC) 150 MG tablet Take 150 mg by mouth 2 (two) times daily.    . sitaGLIPtan-metformin (JANUMET) 50-1000 MG per tablet Take 1 tablet by mouth 2 (two) times daily with a meal.  . Triamcinolone Acetonide (NASACORT ALLERGY 24HR NA) Place 1 spray into the nose daily as needed (congestion).  . verapamil (CALAN-SR) 180 MG CR tablet TAKE (2) TABLETS BY MOUTH AT BEDTIME.  Marland Kitchen warfarin (COUMADIN) 5 MG tablet TAKE 1 TABLET DAILY, EXCEPT 1 1/2 TABLETS ON TUESDAYS, THURSDAYS AND SATURDAYS.  Marland Kitchen Wheat Dextrin (BENEFIBER) POWD Take 1 scoop by mouth daily.   . [DISCONTINUED] lisinopril (PRINIVIL,ZESTRIL) 40 MG tablet TAKE ONE TABLET BY MOUTH ONCE DAILY.   No facility-administered encounter medications on file as of 03/14/2017.      Review of Systems  Constitutional:  No  weight loss, night sweats,  Fevers, chills, fatigue, or  lassitude.  HEENT:   No headaches,  Difficulty swallowing,  Tooth/dental problems, or  Sore throat,                No sneezing, itching, ear ache, nasal congestion, post nasal drip,   CV:  No chest pain,  Orthopnea, PND, swelling in lower extremities, anasarca, dizziness, palpitations, syncope.   GI  No heartburn, indigestion, abdominal pain, nausea, vomiting, diarrhea, change in bowel habits, loss of appetite, bloody stools.   Resp: No shortness of breath with exertion or at rest.  No excess mucus, no productive cough,  No non-productive cough,  No coughing up of blood.  No change in color of mucus.  No wheezing.   No chest wall deformity  Skin: no rash or lesions.  GU: no dysuria, change in color of urine, no urgency or frequency.  No flank pain, no hematuria   MS:  No joint pain or swelling.  No decreased range of motion.  No back pain.    Physical Exam  BP 124/64 (BP Location: Left Arm, Cuff Size: Normal)   Pulse 82   Ht 6\' 1"  (1.854 m)   Wt 231 lb 6.4 oz (105 kg)   SpO2 97%   BMI 30.53 kg/m   GEN: A/Ox3; pleasant , NAD, obese     HEENT:  [place]/AT,  EACs-clear, TMs-wnl, NOSE-clear, THROAT-clear, no lesions, no postnasal drip or exudate noted.  Class 2-3 MP airway   NECK:  Supple w/ fair ROM; no JVD; normal carotid impulses w/o bruits; no thyromegaly or nodules palpated; no lymphadenopathy.    RESP  Clear  P & A; w/o, wheezes/ rales/ or rhonchi. no accessory muscle use, no dullness to percussion  CARD:  RRR, no m/r/g, no peripheral edema, pulses intact, no cyanosis or clubbing.  GI:   Soft & nt; nml bowel sounds; no organomegaly or masses detected.   Musco: Warm bil, no deformities or joint swelling noted.   Neuro: alert, no focal deficits noted.    Skin: Warm, no lesions or rashes    Lab Results:  CBC  BNP No results found for: BNP  ProBNP  Imaging: No results found.   Assessment & Plan:   OSA (obstructive sleep apnea) Compensated on CPAP   Plan  Patient Instructions  Continue on CPAP At bedtime   Wear for at least 6hrs each night.  Work on healthy weight .  Do not drive if sleepy.  Follow up Dr. Elsworth Soho  In 1 year and As needed        Morbid obesity [facility]) Wt loss      Rexene Edison, NP 03/14/2017

## 2017-03-19 ENCOUNTER — Other Ambulatory Visit: Payer: Self-pay | Admitting: Cardiovascular Disease

## 2017-03-19 ENCOUNTER — Ambulatory Visit (INDEPENDENT_AMBULATORY_CARE_PROVIDER_SITE_OTHER): Payer: Medicare Other | Admitting: *Deleted

## 2017-03-19 ENCOUNTER — Telehealth: Payer: Self-pay | Admitting: *Deleted

## 2017-03-19 DIAGNOSIS — I480 Paroxysmal atrial fibrillation: Secondary | ICD-10-CM | POA: Diagnosis not present

## 2017-03-19 DIAGNOSIS — Z5181 Encounter for therapeutic drug level monitoring: Secondary | ICD-10-CM

## 2017-03-19 LAB — POCT INR: INR: 3.5

## 2017-03-19 NOTE — Telephone Encounter (Signed)
Patient was placed on muscle relaxer (Methocarbamol 500mg  TID) / tg

## 2017-03-19 NOTE — Patient Instructions (Signed)
Hold coumadin tonight then decrease dose to 1 tablet daily except 1 1/2 tablets Tuesdays and Saturdays.  Recheck in 3 weeks.

## 2017-03-19 NOTE — Telephone Encounter (Signed)
Spoke with wife.  Informed her that Methocarbamol should not interfere with coumadin.  He is taking as needed now not 3 x daily.

## 2017-03-20 ENCOUNTER — Ambulatory Visit: Payer: Medicare Other | Admitting: Internal Medicine

## 2017-03-20 ENCOUNTER — Encounter: Payer: Self-pay | Admitting: Internal Medicine

## 2017-03-20 VITALS — BP 187/89 | HR 64 | Temp 97.1°F | Ht 73.0 in | Wt 233.4 lb

## 2017-03-20 DIAGNOSIS — Z86010 Personal history of colonic polyps: Secondary | ICD-10-CM | POA: Diagnosis not present

## 2017-03-20 DIAGNOSIS — K219 Gastro-esophageal reflux disease without esophagitis: Secondary | ICD-10-CM

## 2017-03-20 DIAGNOSIS — K5900 Constipation, unspecified: Secondary | ICD-10-CM | POA: Diagnosis not present

## 2017-03-20 NOTE — Patient Instructions (Signed)
GERD information provided  Anatacids as needed for GERD  Take Benefiber 1 heaping tablespoon twice daily  Use Miralax only as needed for constipation  Plan to loose 15 pounds between now and next office visit   Office visit in September of this year to set up a colonoscopy

## 2017-03-20 NOTE — Progress Notes (Signed)
Primary Care Physician:  Redmond School, MD Primary Gastroenterologist:  Dr. Gala Romney  Pre-Procedure History & Physical: HPI:  Riley [name] is a 72 y.o. male here for follow-up of GERD and constipation. States that MiraLAX and Benefiber became too much when he started on a chronic oral magnesium supplement.  He stopped the Benefiber. Sometimes doesn't feel he evacuates entirely. Has reflux symptoms only 2-3 times a month. No dysphagia. Takes antacids OTC agents on a when necessary basis. History of colonic adenoma; due for his opioids last colonoscopy September this year. He's gained some weight over the holidays currently weighs 233 pounds.  Past Medical History:  Diagnosis Date  . Chronic anticoagulation   . Chronic back pain   . Diabetes mellitus, type 2 (HCC)    no insulin  . Edema   . GERD (gastroesophageal reflux disease)   . Hyperlipidemia   . Hypertension    normal coronary angiography and normal EF in 12/98  . 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 Paroxysmal atrial fibrillation ([place])    12/2005; [name]     Past Surgical History:  Procedure Laterality Date  . APPENDECTOMY  1998  . COLONOSCOPY  Never  . COLONOSCOPY N/A 11/26/2012   PIR:JJOAC 2 hemorrhoids. Pancolonic diverticulosis. Colonic polyps removed as described above. One hemostasis clip applied. TUBULAR ADENOMA. Surveillance Sept 2019.   . INSERTION OF MESH N/A 01/27/2013   Procedure: INSERTION OF MESH;  Surgeon: Jamesetta So, MD;  Location: [facility];  Service: General;  Laterality: N/A;  . TONSILLECTOMY     as a child  . UMBILICAL HERNIA REPAIR N/A 01/27/2013   Procedure: HERNIA REPAIR UMBILICAL ADULT;  Surgeon: Jamesetta So, MD;  Location: [facility];  Service: General;  Laterality: N/A;    Prior to Admission medications   Medication Sig Start Date End Date Taking? Authorizing Provider  chlorthalidone (HYGROTON) 25 MG tablet TAKE (1/2) TABLET  BY MOUTH ONCE DAILY. 03/17/16  Yes Herminio Commons, MD  chlorthalidone (HYGROTON) 25 MG tablet TAKE (1/2) TABLET BY MOUTH ONCE DAILY. 03/19/17  Yes Herminio Commons, MD  diltiazem (CARDIZEM) 30 MG tablet Take 1 tablet (30 mg total) by mouth daily as needed (Palpations). 12/23/14  Yes Herminio Commons, MD  doxazosin (CARDURA) 4 MG tablet TAKE (3) TABLETS BY MOUTH ONCE DAILY. 11/24/16  Yes Herminio Commons, MD  furosemide (LASIX) 20 MG tablet Take 20 mg by mouth as needed.   Yes [provider]  lisinopril (PRINIVIL,ZESTRIL) 40 MG tablet TAKE ONE TABLET BY MOUTH ONCE DAILY. 11/29/15  Yes Herminio Commons, MD  Magnesium Oxide 200 MG TABS Take 1 tablet (200 mg total) by mouth daily. 11/02/16  Yes Lendon Colonel, NP  methocarbamol (ROBAXIN) 500 MG tablet Take 500 mg by mouth every 8 (eight) hours as needed for muscle spasms.   Yes [provider]  polyethylene glycol powder (GLYCOLAX/MIRALAX) powder MIX 1 CAPFUL (17 GRAMS) WITH 8OZ OF WATER OR JUICE DAILY. 10/03/16  Yes Annitta Needs, NP  potassium chloride SA (K-DUR,KLOR-CON) 20 MEQ tablet Take 20 mEq by mouth as needed.   Yes [provider]  pravastatin (PRAVACHOL) 20 MG tablet Take 1 tablet (20 mg total) by mouth every evening. 08/09/16  Yes Herminio Commons, MD  ranitidine (ZANTAC) 150 MG tablet Take 150 mg by mouth 2 (two) times daily.     Yes [provider]  sitaGLIPtan-metformin (JANUMET) 50-1000 MG per tablet Take 1 tablet by mouth 2 (two) times daily with a meal.   Yes [provider]  Triamcinolone Acetonide (NASACORT ALLERGY 24HR NA) Place 1 spray into the nose daily as needed (congestion).   Yes [provider]  verapamil (CALAN-SR) 180 MG CR tablet TAKE (2) TABLETS BY MOUTH AT BEDTIME. 03/12/17  Yes Herminio Commons, MD  warfarin (COUMADIN) 5 MG tablet TAKE 1 TABLET DAILY, EXCEPT 1 1/2 TABLETS ON TUESDAYS, [place]. Patient taking differently: TAKE  1 TABLET DAILY, EXCEPT 1 1/2 TABLETS ON TUESDAYS, THURSDAYS AND SATURDAYS. Takes 1 tablet daily except 1 1/2 Tues and Saturday 08/14/16  Yes Herminio Commons, MD  Wheat Dextrin (BENEFIBER) POWD Take 1 scoop by mouth daily.     [provider]    Allergies as of 03/20/2017  . (No Known Allergies)    Family History  Problem Relation Age of Onset  . Heart disease Father   . Heart disease Mother   . Bronchitis Mother   . Allergies Unknown        whole family  . Colon cancer Neg Hx     Social History   Socioeconomic History  . Marital status: Married    Spouse name: Not on file  . Number of children: Not on file  . Years of education: Not on file  . Highest education level: Not on file  Social Needs  . Financial resource strain: Not on file  . Food insecurity - worry: Not on file  . Food insecurity - inability: Not on file  . Transportation needs - medical: Not on file  . Transportation needs - non-medical: Not on file  Occupational History  . Occupation: Retired    Fish farm manager: [employer]    Comment: Company secretary  Tobacco Use  . Smoking status: Never Smoker  . Smokeless tobacco: Never Used  Substance and Sexual Activity  . Alcohol use: No    Alcohol/week: 0.0 oz  . Drug use: No  . Sexual activity: Not on file  Other Topics Concern  . Not on file  Social History Narrative  . Not on file    Review of Systems: See HPI, otherwise negative ROS  Physical Exam: BP (!) 187/89   Pulse 64   Temp (!) 97.1 F (36.2 C) (Oral)   Ht 6\' 1"  (1.854 m)   Wt 233 lb 6.4 oz (105.9 kg)   BMI 30.79 kg/m  General:   Alert,   pleasant and cooperative in NAD Detailed examination deferred  Impression:   Pleasant 72 year old gentleman with chronic intermittent GERD without any alarm symptoms. Symptoms are fairly infrequent. Managed well with the when necessary use of OTC agents.  Chronic constipation. Over shooting endpoint of treatment  recently with  additional oral magnesium on top of Benefiber and MiraLAX. He is leaving off Benefiber but not evacuating very well although she is having regular BMs. I think as long as he's  taking magnesium,  he may be better off taking Benefiber every day with MiraLAX being consumed when necessary.  History of colonic adenoma.   Recommendations:   GERD information provided  Anatacids as needed for GERD  Take Benefiber 1 heaping tablespoon twice daily  Use Miralax only as needed for constipation  Plan to loose 15 pounds between now and next office visit   Office visit in September of this year to set up a colonoscopy        Notice: This dictation  was prepared with Dragon dictation along with smaller phrase technology. Any transcriptional errors that result from this process are unintentional and may not be corrected upon review.

## 2017-03-22 ENCOUNTER — Other Ambulatory Visit: Payer: Self-pay | Admitting: Cardiovascular Disease

## 2017-04-09 ENCOUNTER — Ambulatory Visit (INDEPENDENT_AMBULATORY_CARE_PROVIDER_SITE_OTHER): Payer: Medicare Other | Admitting: *Deleted

## 2017-04-09 DIAGNOSIS — Z5181 Encounter for therapeutic drug level monitoring: Secondary | ICD-10-CM

## 2017-04-09 DIAGNOSIS — I480 Paroxysmal atrial fibrillation: Secondary | ICD-10-CM | POA: Diagnosis not present

## 2017-04-09 LAB — POCT INR: INR: 3.3

## 2017-04-09 NOTE — Patient Instructions (Signed)
Hold coumadin tonight then resume 1 tablet daily except 1 1/2 tablets Tuesdays and Saturdays.  Recheck in 3 weeks.

## 2017-04-16 ENCOUNTER — Other Ambulatory Visit: Payer: Self-pay | Admitting: Gastroenterology

## 2017-04-19 ENCOUNTER — Ambulatory Visit: Payer: Medicare Other | Admitting: Cardiovascular Disease

## 2017-04-19 ENCOUNTER — Encounter: Payer: Self-pay | Admitting: Cardiovascular Disease

## 2017-04-19 VITALS — BP 158/68 | HR 62 | Ht 73.0 in | Wt 232.0 lb

## 2017-04-19 DIAGNOSIS — I25118 Atherosclerotic heart disease of native coronary artery with other forms of angina pectoris: Secondary | ICD-10-CM | POA: Diagnosis not present

## 2017-04-19 DIAGNOSIS — I10 Essential (primary) hypertension: Secondary | ICD-10-CM

## 2017-04-19 DIAGNOSIS — Z9989 Dependence on other enabling machines and devices: Secondary | ICD-10-CM | POA: Diagnosis not present

## 2017-04-19 DIAGNOSIS — Z79899 Other long term (current) drug therapy: Secondary | ICD-10-CM | POA: Diagnosis not present

## 2017-04-19 DIAGNOSIS — E7800 Pure hypercholesterolemia, unspecified: Secondary | ICD-10-CM

## 2017-04-19 DIAGNOSIS — I481 Persistent atrial fibrillation: Secondary | ICD-10-CM

## 2017-04-19 DIAGNOSIS — G4733 Obstructive sleep apnea (adult) (pediatric): Secondary | ICD-10-CM

## 2017-04-19 DIAGNOSIS — I4811 Longstanding persistent atrial fibrillation: Secondary | ICD-10-CM

## 2017-04-19 MED ORDER — CHLORTHALIDONE 25 MG PO TABS: 25.0000 mg | ORAL_TABLET | Freq: Every day | ORAL | 3 refills | Status: DC

## 2017-04-19 NOTE — Patient Instructions (Addendum)
Your physician wants you to follow-up in:  1 year with Dr Virgina Jock will receive a reminder letter in the mail two months in advance. If you don't receive a letter, please call our office to schedule the follow-up appointment.     INCREASE Chlorthalidone to 25 mg daily    If you need a refill on your cardiac medications before your next appointment, please call your pharmacy.    Get lab work : BMET in 5 days    No tests ordered today      Thank you for choosing [facility] !

## 2017-04-19 NOTE — Progress Notes (Signed)
SUBJECTIVE: The patient presents for follow up of paroxysmal atrial fibrillation for which he takes warfarin, hypertension, hyperlipidemia, and sleep apnea for which he is on CPAP.  He had been having some chest pain last year and underwent a low risk nuclear stress test on 11/09/16.  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  He currently denies chest pain, shortness of breath, orthopnea, and leg swelling.  He seldom has palpitations.  He said he is felt very well ever since starting CPAP.  This improved his breathing.   Review of Systems: As per "subjective", otherwise negative.  No Known Allergies  Current Outpatient Medications  Medication Sig Dispense Refill  . chlorthalidone (HYGROTON) 25 MG tablet TAKE (1/2) TABLET BY MOUTH ONCE DAILY. 45 tablet 3  . diltiazem (CARDIZEM) 30 MG tablet Take 1 tablet (30 mg total) by mouth daily as needed (Palpations). 30 tablet 11  . doxazosin (CARDURA) 4 MG tablet TAKE (3) TABLETS BY MOUTH ONCE DAILY. 90 tablet 0  . furosemide (LASIX) 20 MG tablet Take 20 mg by mouth as needed.    Marland Kitchen lisinopril (PRINIVIL,ZESTRIL) 40 MG tablet TAKE ONE TABLET BY MOUTH ONCE DAILY. 90 tablet 3  . Magnesium Oxide 200 MG TABS Take 1 tablet (200 mg total) by mouth daily. 30 each 11  . methocarbamol (ROBAXIN) 500 MG tablet Take 500 mg by mouth every 8 (eight) hours as needed for muscle spasms.    . polyethylene glycol powder (GLYCOLAX/MIRALAX) powder MIX 1 CAPFUL (17 GRAMS) WITH 8OZ OF WATER OR JUICE DAILY. 527 g 3  . potassium chloride SA (K-DUR,KLOR-CON) 20 MEQ tablet Take 20 mEq by mouth as needed.    . pravastatin (PRAVACHOL) 20 MG tablet Take 1 tablet (20 mg total) by mouth every evening. 90 tablet 3  . ranitidine (ZANTAC) 150 MG tablet Take 150 mg by mouth 2 (two) times daily.      . sitaGLIPtan-metformin (JANUMET) 50-1000 MG per tablet Take 1 tablet by mouth 2  (two) times daily with a meal.    . Triamcinolone Acetonide (NASACORT ALLERGY 24HR NA) Place 1 spray into the nose daily as needed (congestion).    . verapamil (CALAN-SR) 180 MG CR tablet TAKE (2) TABLETS BY MOUTH AT BEDTIME. 60 tablet 11  . warfarin (COUMADIN) 5 MG tablet TAKE 1 TABLET DAILY, EXCEPT 1 1/2 TABLETS ON TUESDAYS, THURSDAYS AND SATURDAYS. (Patient taking differently: TAKE 1 TABLET DAILY, EXCEPT 1 1/2 TABLETS ON TUESDAYS, THURSDAYS AND SATURDAYS. Takes 1 tablet daily except 1 1/2 Tues and Saturday) 45 tablet 6  . Wheat Dextrin (BENEFIBER) POWD Take 1 scoop by mouth daily.      No current facility-administered medications for this visit.     Past Medical History:  Diagnosis Date  . Chronic anticoagulation   . Chronic back pain   . Diabetes mellitus, type 2 (HCC)    no insulin  . Edema   . GERD (gastroesophageal reflux disease)   . Hyperlipidemia   . Hypertension    normal coronary angiography and normal EF in 12/98  . 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 Paroxysmal atrial fibrillation ([place])    12/2005; [name]     Past Surgical History:  Procedure Laterality Date  . APPENDECTOMY  1998  . COLONOSCOPY  Never  . COLONOSCOPY N/A 11/26/2012   MCN:OBSJG 2 hemorrhoids.  Pancolonic diverticulosis. Colonic polyps removed as described above. One hemostasis clip applied. TUBULAR ADENOMA. Surveillance Sept 2019.   . INSERTION OF MESH N/A 01/27/2013   Procedure: INSERTION OF MESH;  Surgeon: Jamesetta So, MD;  Location: [facility];  Service: General;  Laterality: N/A;  . TONSILLECTOMY     as a child  . UMBILICAL HERNIA REPAIR N/A 01/27/2013   Procedure: HERNIA REPAIR UMBILICAL ADULT;  Surgeon: Jamesetta So, MD;  Location: [facility];  Service: General;  Laterality: N/A;    Social History   Socioeconomic History  . Marital status: Married    Spouse name: Not on file  . Number of children: Not on file  . Years of  education: Not on file  . Highest education level: Not on file  Social Needs  . Financial resource strain: Not on file  . Food insecurity - worry: Not on file  . Food insecurity - inability: Not on file  . Transportation needs - medical: Not on file  . Transportation needs - non-medical: Not on file  Occupational History  . Occupation: Retired    Fish farm manager: [employer]    Comment: Company secretary  Tobacco Use  . Smoking status: Never Smoker  . Smokeless tobacco: Never Used  Substance and Sexual Activity  . Alcohol use: No    Alcohol/week: 0.0 oz  . Drug use: No  . Sexual activity: Not on file  Other Topics Concern  . Not on file  Social History Narrative  . Not on file     Vitals:   04/19/17 1115  BP: (!) 158/68  Pulse: 62  SpO2: 97%  Weight: 232 lb (105.2 kg)  Height: 6\' 1"  (1.854 m)    Wt Readings from Last 3 Encounters:  04/19/17 232 lb (105.2 kg)  03/20/17 233 lb 6.4 oz (105.9 kg)  03/14/17 231 lb 6.4 oz (105 kg)     PHYSICAL EXAM General: NAD HEENT: Normal. Neck: No JVD, no thyromegaly. Lungs: Clear to auscultation bilaterally with normal respiratory effort. CV: Regular rate and irregular rhythm, normal S1/S2, no S3, no murmur. No pretibial or periankle edema.  No carotid bruit.   Abdomen: Soft, nontender, no distention.  Neurologic: Alert and oriented.  Psych: Normal affect. Skin: Normal. Musculoskeletal: No gross deformities.    ECG: Most recent ECG reviewed.   Labs: Lab Results  Component Value Date/Time   K 3.9 11/01/2016 08:49 AM   BUN 27 (H) 11/01/2016 08:49 AM   CREATININE 1.17 11/01/2016 08:49 AM   ALT 18 11/01/2016 08:49 AM   TSH 1.59 11/01/2016 08:49 AM   HGB 14.4 11/01/2016 08:49 AM     Lipids: Lab Results  Component Value Date/Time   LDLCALC 68 11/01/2016 08:49 AM   LDLCALC 48 07/30/2006   CHOL 133 11/01/2016 08:49 AM   TRIG 125 11/01/2016 08:49 AM   TRIG 92 and 07/30/2006   HDL 40 (L) 11/01/2016 08:49 AM        ASSESSMENT AND PLAN:  1.  Long-standing persistent atrial fibrillation: Symptomatically stable. He is tolerating warfarin without bleeding complications. I previously discussed alternative forms of anticoagulation with target specific oral anticoagulants, but he preferred to stay on warfarin. Continue verapamil and prn diltiazem.  2. Essential HTN:  Elevated.  I will increase chlorthalidone to 25 mg daily and check a basic metabolic panel within several days.  3. Hyperlipidemia: On pravastatin.   4. Sleep apnea: Using CPAP.   5.  Presumed coronary disease: Symptomatically stable.  Nuclear stress test  was low risk as detailed above.  Continue statin therapy.  No aspirin as he is on warfarin.   Disposition: Follow up 1 year   Kate Sable, M.D., F.A.C.C.

## 2017-04-23 ENCOUNTER — Other Ambulatory Visit: Payer: Self-pay | Admitting: Cardiovascular Disease

## 2017-04-24 ENCOUNTER — Other Ambulatory Visit (HOSPITAL_COMMUNITY)
Admission: RE | Admit: 2017-04-24 | Discharge: 2017-04-24 | Disposition: A | Discharge status: Home / Self Care | Payer: Medicare Other | Source: Ambulatory Visit | Attending: Cardiovascular Disease | Admitting: Cardiovascular Disease

## 2017-04-24 DIAGNOSIS — Z79899 Other long term (current) drug therapy: Secondary | ICD-10-CM | POA: Insufficient documentation

## 2017-04-24 LAB — BASIC METABOLIC PANEL
Anion gap: 11 (ref 5–15)
BUN: 23 mg/dL — AB (ref 6–20)
CALCIUM: 9.5 mg/dL (ref 8.9–10.3)
CO2: 26 mmol/L (ref 22–32)
CREATININE: 1.08 mg/dL (ref 0.61–1.24)
Chloride: 100 mmol/L — ABNORMAL LOW (ref 101–111)
GFR calc Af Amer: >60 mL/min (ref >60)
GFR calc non Af Amer: >60 mL/min (ref >60)
Glucose, Bld: 141 mg/dL — ABNORMAL HIGH (ref 65–99)
Potassium: 3.5 mmol/L (ref 3.5–5.1)
SODIUM: 137 mmol/L (ref 135–145)

## 2017-04-30 ENCOUNTER — Ambulatory Visit (INDEPENDENT_AMBULATORY_CARE_PROVIDER_SITE_OTHER): Payer: Medicare Other | Admitting: *Deleted

## 2017-04-30 DIAGNOSIS — I480 Paroxysmal atrial fibrillation: Secondary | ICD-10-CM | POA: Diagnosis not present

## 2017-04-30 DIAGNOSIS — Z5181 Encounter for therapeutic drug level monitoring: Secondary | ICD-10-CM | POA: Diagnosis not present

## 2017-04-30 LAB — POCT INR: INR: 2.7

## 2017-04-30 NOTE — Patient Instructions (Signed)
Continue coumadin 1 tablet daily except 1 1/2 tablets Tuesdays and Saturdays.   Recheck in 4 weeks.  

## 2017-05-07 ENCOUNTER — Other Ambulatory Visit: Payer: Self-pay | Admitting: Cardiovascular Disease

## 2017-05-18 ENCOUNTER — Other Ambulatory Visit: Payer: Self-pay | Admitting: Cardiovascular Disease

## 2017-06-06 ENCOUNTER — Ambulatory Visit (INDEPENDENT_AMBULATORY_CARE_PROVIDER_SITE_OTHER): Payer: Medicare Other | Admitting: *Deleted

## 2017-06-06 DIAGNOSIS — Z5181 Encounter for therapeutic drug level monitoring: Secondary | ICD-10-CM

## 2017-06-06 DIAGNOSIS — I480 Paroxysmal atrial fibrillation: Secondary | ICD-10-CM | POA: Diagnosis not present

## 2017-06-06 LAB — POCT INR: INR: 2.7

## 2017-06-06 NOTE — Patient Instructions (Signed)
Continue coumadin 1 tablet daily except 1 1/2 tablets Tuesdays and Saturdays.   Recheck in 4 weeks.  

## 2017-07-04 ENCOUNTER — Ambulatory Visit (INDEPENDENT_AMBULATORY_CARE_PROVIDER_SITE_OTHER): Payer: Medicare Other | Admitting: *Deleted

## 2017-07-04 DIAGNOSIS — Z5181 Encounter for therapeutic drug level monitoring: Secondary | ICD-10-CM | POA: Diagnosis not present

## 2017-07-04 DIAGNOSIS — I480 Paroxysmal atrial fibrillation: Secondary | ICD-10-CM

## 2017-07-04 LAB — POCT INR: INR: 2.3

## 2017-07-04 NOTE — Patient Instructions (Signed)
Continue coumadin 1 tablet daily except 1 1/2 tablets Tuesdays and Saturdays.   Recheck in 6 weeks.  

## 2017-07-16 ENCOUNTER — Other Ambulatory Visit: Payer: Self-pay | Admitting: Cardiovascular Disease

## 2017-08-01 DIAGNOSIS — E1121 Type 2 diabetes mellitus with diabetic nephropathy: Secondary | ICD-10-CM | POA: Insufficient documentation

## 2017-08-01 DIAGNOSIS — I2510 Atherosclerotic heart disease of native coronary artery without angina pectoris: Secondary | ICD-10-CM | POA: Insufficient documentation

## 2017-08-01 DIAGNOSIS — N182 Chronic kidney disease, stage 2 (mild): Secondary | ICD-10-CM | POA: Insufficient documentation

## 2017-08-01 DIAGNOSIS — I4719 Other supraventricular tachycardia: Secondary | ICD-10-CM | POA: Insufficient documentation

## 2017-08-09 ENCOUNTER — Other Ambulatory Visit (HOSPITAL_COMMUNITY): Payer: Self-pay | Admitting: Medical

## 2017-08-09 DIAGNOSIS — N183 Chronic kidney disease, stage 3 unspecified: Secondary | ICD-10-CM

## 2017-08-15 ENCOUNTER — Ambulatory Visit (INDEPENDENT_AMBULATORY_CARE_PROVIDER_SITE_OTHER): Payer: Medicare Other | Admitting: *Deleted

## 2017-08-15 DIAGNOSIS — I480 Paroxysmal atrial fibrillation: Secondary | ICD-10-CM

## 2017-08-15 DIAGNOSIS — Z5181 Encounter for therapeutic drug level monitoring: Secondary | ICD-10-CM

## 2017-08-15 LAB — POCT INR: INR: 2 (ref 2.0–3.0)

## 2017-08-15 NOTE — Patient Instructions (Signed)
Continue coumadin 1 tablet daily except 1 1/2 tablets Tuesdays and Saturdays.   Recheck in 6 weeks.  

## 2017-08-22 ENCOUNTER — Ambulatory Visit (HOSPITAL_COMMUNITY)
Admission: RE | Admit: 2017-08-22 | Discharge: 2017-08-22 | Disposition: A | Discharge status: Home / Self Care | Payer: Medicare Other | Source: Ambulatory Visit | Attending: Medical | Admitting: Medical

## 2017-08-22 DIAGNOSIS — N183 Chronic kidney disease, stage 3 unspecified: Secondary | ICD-10-CM

## 2017-08-22 DIAGNOSIS — N281 Cyst of kidney, acquired: Secondary | ICD-10-CM | POA: Diagnosis not present

## 2017-09-11 IMAGING — NM NM MYOCAR MULTI W/SPECT W/WALL MOTION & EF
2 series · 12 of 12 positions shown · non-contrast
Comparison: none

[Series 1: rest · 6.51mm/px · 6 of 64 frames shown]
[frame 6/64]
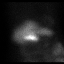
[frame 16/64]
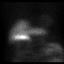
[frame 27/64]
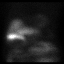
[frame 38/64]
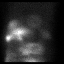
[frame 48/64]
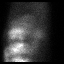
[frame 59/64]
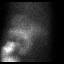

[Series 2: stress gated · 6.51mm/px · 6 of 64 frames shown]
[frame 6/64]
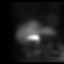
[frame 16/64]
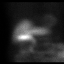
[frame 27/64]
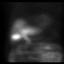
[frame 38/64]
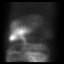
[frame 48/64]
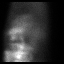
[frame 59/64]
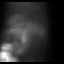

[12 of 12 positions shown; findings below may reference images not displayed]

Canned report from images found in remote index.

Refer to host system for actual result text.

## 2017-09-26 ENCOUNTER — Ambulatory Visit (INDEPENDENT_AMBULATORY_CARE_PROVIDER_SITE_OTHER): Payer: Medicare Other | Admitting: *Deleted

## 2017-09-26 DIAGNOSIS — Z5181 Encounter for therapeutic drug level monitoring: Secondary | ICD-10-CM

## 2017-09-26 DIAGNOSIS — I480 Paroxysmal atrial fibrillation: Secondary | ICD-10-CM

## 2017-09-26 LAB — POCT INR: INR: 2.3 (ref 2.0–3.0)

## 2017-09-26 NOTE — Patient Instructions (Signed)
Continue coumadin 1 tablet daily except 1 1/2 tablets Tuesdays and Saturdays.   Recheck in 6 weeks.  

## 2017-10-15 ENCOUNTER — Encounter: Payer: Self-pay | Admitting: Internal Medicine

## 2017-10-15 ENCOUNTER — Other Ambulatory Visit: Payer: Self-pay | Admitting: Cardiovascular Disease

## 2017-10-25 ENCOUNTER — Other Ambulatory Visit: Payer: Self-pay | Admitting: Cardiovascular Disease

## 2017-11-07 ENCOUNTER — Ambulatory Visit (INDEPENDENT_AMBULATORY_CARE_PROVIDER_SITE_OTHER): Payer: Medicare Other | Admitting: *Deleted

## 2017-11-07 DIAGNOSIS — I480 Paroxysmal atrial fibrillation: Secondary | ICD-10-CM | POA: Diagnosis not present

## 2017-11-07 DIAGNOSIS — Z5181 Encounter for therapeutic drug level monitoring: Secondary | ICD-10-CM | POA: Diagnosis not present

## 2017-11-07 LAB — POCT INR: INR: 2.2 (ref 2.0–3.0)

## 2017-11-07 NOTE — Patient Instructions (Signed)
Continue coumadin 1 tablet daily except 1 1/2 tablets Tuesdays and Saturdays.   Recheck in 6 weeks.  

## 2017-12-19 ENCOUNTER — Ambulatory Visit (INDEPENDENT_AMBULATORY_CARE_PROVIDER_SITE_OTHER): Payer: Medicare Other | Admitting: *Deleted

## 2017-12-19 DIAGNOSIS — Z5181 Encounter for therapeutic drug level monitoring: Secondary | ICD-10-CM

## 2017-12-19 DIAGNOSIS — I480 Paroxysmal atrial fibrillation: Secondary | ICD-10-CM

## 2017-12-19 LAB — POCT INR: INR: 2.3 (ref 2.0–3.0)

## 2017-12-19 NOTE — Patient Instructions (Signed)
Continue coumadin 1 tablet daily except 1 1/2 tablets Tuesdays and Saturdays.   Recheck in 6 weeks.  

## 2017-12-20 ENCOUNTER — Encounter: Payer: Self-pay | Admitting: *Deleted

## 2017-12-20 ENCOUNTER — Ambulatory Visit: Payer: Medicare Other | Admitting: Gastroenterology

## 2017-12-20 ENCOUNTER — Encounter: Payer: Self-pay | Admitting: Gastroenterology

## 2017-12-20 ENCOUNTER — Telehealth: Payer: Self-pay | Admitting: Gastroenterology

## 2017-12-20 VITALS — BP 131/72 | HR 69 | Temp 96.7°F | Ht 73.0 in | Wt 229.6 lb

## 2017-12-20 DIAGNOSIS — K5900 Constipation, unspecified: Secondary | ICD-10-CM | POA: Diagnosis not present

## 2017-12-20 DIAGNOSIS — Z86010 Personal history of colonic polyps: Secondary | ICD-10-CM | POA: Diagnosis not present

## 2017-12-20 DIAGNOSIS — K5909 Other constipation: Secondary | ICD-10-CM

## 2017-12-20 DIAGNOSIS — K219 Gastro-esophageal reflux disease without esophagitis: Secondary | ICD-10-CM | POA: Diagnosis not present

## 2017-12-20 NOTE — Telephone Encounter (Signed)
Patient being scheduled for surveillance colonoscopy for history of adenomatous colon polyps. Please advise if he needs Lovenox bridge.

## 2017-12-20 NOTE — Assessment & Plan Note (Signed)
Due for surveillance colonoscopy for history of adenomatous colon polyps.  Will begin Dulcolax 10 mg daily 2 days prior to bowel prep to with cleansing.  He will hold Coumadin 4 days prior to procedure.  I have discussed the risks, alternatives, benefits with regards to but not limited to the risk of reaction to medication, bleeding, infection, perforation and the patient is agreeable to proceed. Written consent to be obtained.

## 2017-12-20 NOTE — Progress Notes (Signed)
CC'D TO PCP °

## 2017-12-20 NOTE — Patient Instructions (Addendum)
1. Consider adding a 1/2 capful of miralax every other day to your current Benefiber, Miralax regimen to soften stools. 2. If you need to switch from generic zantac at any point in future, consider Pepcid complete (famotidine) per label instructions.  3. Colonoscopy as scheduled. See separate instructions.

## 2017-12-20 NOTE — Telephone Encounter (Signed)
CHADS2 Score = 2.  OK to hold coumadin 4 days before procedure.  Pt does not need Lovenox bridge.  Resume coumadin after procedure.

## 2017-12-20 NOTE — Assessment & Plan Note (Signed)
Still with some intermittent hard stools.  Advised to consider adding back a half a capful of MiraLAX every other day alternating with two thirds of capful every other day.  Continue Benefiber 1.5 teaspoon every other day.

## 2017-12-20 NOTE — Progress Notes (Signed)
Primary Care Physician: Redmond School, MD  Primary Gastroenterologist:  Garfield Cornea, MD   Chief Complaint  Patient presents with  . Consult    TCS. due for 5 yr  . Constipation    still having hard stool  . Hemorrhoids    using prep H 3 times per week    HPI: Riley [name] is a 72 y.o. male here to schedule surveillance colonoscopy.  He has a history of adenomatous colon polyps, last colonoscopy 2014.  History of GERD and constipation.  Last seen in January.  At that time was having difficulty with bowels after recent oral magnesium started on top of Benefiber and MiraLAX, overshooting endpoint of treatment.  He was advised to take Benefiber every other day, MiraLAX as needed.  Presents today stating he is taking Benefiber 1-1/2 teaspoons every other day alternating with MiraLAX two thirds capful every other day.  Sometimes still has to strain for bowel movement.  Sometimes doing hard.  Other times no difficulties.  Has a bowel movement every other day.  Uses Preparation H about 3 times a week for hemorrhoids.  Denies blood in stool. Heartburn well controlled on zantac bid.  Patient states he discussed with his pharmacist regarding his ranitidine and recent concerns for carcinogens.  He was advised that he can continue taking it.    Current Outpatient Medications  Medication Sig Dispense Refill  . chlorthalidone (HYGROTON) 25 MG tablet Take 1 tablet (25 mg total) by mouth daily. 90 tablet 3  . diltiazem (CARDIZEM) 30 MG tablet Take 1 tablet (30 mg total) by mouth daily as needed (Palpations). 30 tablet 11  . doxazosin (CARDURA) 4 MG tablet TAKE (3) TABLETS BY MOUTH ONCE DAILY. 90 tablet 0  . furosemide (LASIX) 20 MG tablet Take 20 mg by mouth as needed.    . Hydrocortisone (PREPARATION H EX) Apply topically. 3 times per week    . lisinopril (PRINIVIL,ZESTRIL) 40 MG tablet TAKE ONE TABLET BY MOUTH ONCE DAILY. 90 tablet 3  . lisinopril (PRINIVIL,ZESTRIL) 40 MG tablet TAKE ONE  TABLET BY MOUTH ONCE DAILY. 90 tablet 3  . Magnesium Oxide 200 MG TABS Take 1 tablet (200 mg total) by mouth daily. 30 each 11  . methocarbamol (ROBAXIN) 500 MG tablet Take 500 mg by mouth every 8 (eight) hours as needed for muscle spasms.    . polyethylene glycol powder (GLYCOLAX/MIRALAX) powder MIX 1 CAPFUL (17 GRAMS) WITH 8OZ OF WATER OR JUICE DAILY. (Patient taking differently: 1 tsp on days not taking benefiber) 527 g 3  . potassium chloride SA (K-DUR,KLOR-CON) 20 MEQ tablet Take 20 mEq by mouth as needed.    . pravastatin (PRAVACHOL) 20 MG tablet TAKE (1) TABLET BY MOUTH ONCE DAILY. 90 tablet 3  . ranitidine (ZANTAC) 150 MG tablet Take 150 mg by mouth 2 (two) times daily.      . sitaGLIPtan-metformin (JANUMET) 50-1000 MG per tablet Take 1 tablet by mouth 2 (two) times daily with a meal.    . Triamcinolone Acetonide (NASACORT ALLERGY 24HR NA) Place 1 spray into the nose daily as needed (congestion).    . verapamil (CALAN-SR) 180 MG CR tablet TAKE (2) TABLETS BY MOUTH AT BEDTIME. 60 tablet 11  . warfarin (COUMADIN) 5 MG tablet TAKE 1 TABLET DAILY, EXCEPT 1 1/2 TABLETS ON TUESDAYS, AND SATURDAYS. 45 tablet 0  . Wheat Dextrin (BENEFIBER) POWD Take by mouth every other day. 1 tsp every other day     No current  facility-administered medications for this visit.     Allergies as of 12/20/2017  . (No Known Allergies)   Past Medical History:  Diagnosis Date  . Chronic anticoagulation   . Chronic back pain   . Diabetes mellitus, type 2 (HCC)    no insulin  . Edema   . GERD (gastroesophageal reflux disease)   . Hyperlipidemia   . Hypertension    normal coronary angiography and normal EF in 12/98  . 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 Paroxysmal atrial fibrillation ([place])    12/2005; [name]    Past Surgical History:  Procedure Laterality Date  . APPENDECTOMY  1998  . COLONOSCOPY N/A 11/26/2012   RXY:VOPFY 2  hemorrhoids. Pancolonic diverticulosis. Colonic polyps removed as described above. One hemostasis clip applied. TUBULAR ADENOMA. Surveillance Sept 2019.   . INSERTION OF MESH N/A 01/27/2013   Procedure: INSERTION OF MESH;  Surgeon: Jamesetta So, MD;  Location: [facility];  Service: General;  Laterality: N/A;  . TONSILLECTOMY     as a child  . UMBILICAL HERNIA REPAIR N/A 01/27/2013   Procedure: HERNIA REPAIR UMBILICAL ADULT;  Surgeon: Jamesetta So, MD;  Location: [facility];  Service: General;  Laterality: N/A;   Family History  Problem Relation Age of Onset  . Heart disease Father   . Heart disease Mother   . Bronchitis Mother   . Allergies Unknown        whole family  . Colon cancer Neg Hx    Social History   Tobacco Use  . Smoking status: Never Smoker  . Smokeless tobacco: Never Used  Substance Use Topics  . Alcohol use: No    Alcohol/week: 0.0 standard drinks  . Drug use: No    ROS:  General: Negative for anorexia, weight loss, fever, chills, fatigue, weakness. ENT: Negative for hoarseness, difficulty swallowing , nasal congestion. CV: Negative for chest pain, angina, palpitations, dyspnea on exertion, peripheral edema.  Respiratory: Negative for dyspnea at rest, dyspnea on exertion, cough, sputum, wheezing.  GI: See history of present illness. GU:  Negative for dysuria, hematuria, urinary incontinence, urinary frequency, nocturnal urination.  Endo: Negative for unusual weight change.    Physical Examination:   BP 131/72   Pulse 69   Temp (!) 96.7 F (35.9 C) (Oral)   Ht 6\' 1"  (1.854 m)   Wt 229 lb 9.6 oz (104.1 kg)   BMI 30.29 kg/m   General: Well-nourished, well-developed in no acute distress.  Eyes: No icterus. Mouth: Oropharyngeal mucosa moist and pink , no lesions erythema or exudate. Lungs: Clear to auscultation bilaterally.  Heart: Regular rate and rhythm, no murmurs rubs or gallops.  Abdomen: Bowel sounds are normal, nontender, nondistended, no  hepatosplenomegaly or masses, no abdominal bruits or hernia , no rebound or guarding.   Extremities: No lower extremity edema. No clubbing or deformities. Neuro: Alert and oriented x 4   Skin: Warm and dry, no jaundice.   Psych: Alert and cooperative, normal mood and affect.  Labs:  Lab Results  Component Value Date   CREATININE 1.08 04/24/2017   BUN 23 (H) 04/24/2017   NA 137 04/24/2017   K 3.5 04/24/2017   CL 100 (L) 04/24/2017   CO2 26 04/24/2017   Lab Results  Component Value Date   ALT 18 11/01/2016   AST 14 11/01/2016   ALKPHOS 45 11/01/2016   BILITOT 0.6 11/01/2016  Lab Results  Component Value Date   INR 2.3 12/19/2017   INR 2.2 11/07/2017   INR 2.3 09/26/2017   Lab Results  Component Value Date   WBC 6.0 11/01/2016   HGB 14.4 11/01/2016   HCT 43.6 11/01/2016   MCV 91.4 11/01/2016   PLT 184 11/01/2016     Imaging Studies: No results found.

## 2017-12-20 NOTE — Assessment & Plan Note (Signed)
Has been well managed on ranitidine 150 mg twice daily.  He is already discussed potential carcinogen concerns pharmacist.  If patient decides to switch from the future, consider Pepcid Complete as per instructions on the label.

## 2017-12-24 ENCOUNTER — Other Ambulatory Visit: Payer: Self-pay | Admitting: Cardiovascular Disease

## 2017-12-31 ENCOUNTER — Other Ambulatory Visit: Payer: Self-pay | Admitting: Cardiovascular Disease

## 2018-01-01 ENCOUNTER — Telehealth: Payer: Self-pay | Admitting: Cardiovascular Disease

## 2018-01-01 ENCOUNTER — Other Ambulatory Visit: Payer: Self-pay

## 2018-01-01 MED ORDER — DILTIAZEM HCL 30 MG PO TABS: 30.0000 mg | ORAL_TABLET | Freq: Every day | ORAL | 11 refills | Status: AC | PRN

## 2018-01-01 NOTE — Telephone Encounter (Signed)
Returned pt call. His wife stated that he recently went to see Dr. Lowanda Foster, and per recent labs that show his kidney function has decreased, Dr. Lowanda Foster wants to stop the chlorthalidone and keep lasix prn. Pt will not stop chlorthalidone until Dr. Bronson Ing tells them it is safe from a heart stand point to do so. Please advise.

## 2018-01-01 NOTE — Telephone Encounter (Signed)
Please give pt a call concerning his meds. His kidney Dr wants to hold some of his meds and wants to speak with Dr. Bronson Ing before he does anything.

## 2018-01-02 NOTE — Telephone Encounter (Signed)
That would be okay so long as blood pressure remains carefully monitored and I would recommend a nephrologist to adjust antihypertensive therapy given the cessation of chlorthalidone.

## 2018-01-03 NOTE — Telephone Encounter (Signed)
Called pt., no answer. Left message for pt to return call.  

## 2018-01-03 NOTE — Telephone Encounter (Signed)
LM for wife that it is ok to stop chlorthalidone and to have nephrology follow his BP adjustments, also for pt to monitor BP's carefully

## 2018-01-04 NOTE — Telephone Encounter (Signed)
PT's wife made aware. She voiced understanding.

## 2018-01-30 ENCOUNTER — Ambulatory Visit (INDEPENDENT_AMBULATORY_CARE_PROVIDER_SITE_OTHER): Payer: Medicare Other | Admitting: *Deleted

## 2018-01-30 DIAGNOSIS — Z5181 Encounter for therapeutic drug level monitoring: Secondary | ICD-10-CM | POA: Diagnosis not present

## 2018-01-30 DIAGNOSIS — I480 Paroxysmal atrial fibrillation: Secondary | ICD-10-CM

## 2018-01-30 LAB — POCT INR: INR: 2 (ref 2.0–3.0)

## 2018-01-30 NOTE — Patient Instructions (Signed)
Take coumadin 1 1/2 tablets tonight then resume 1 tablet daily except 1 1/2 tablets Tuesdays and Saturdays. Scheduled for colonoscopy on 12/4.  Will hold coumadin 4 days before procedure.  Take last dose of coumadin 11/29 and restart night of procedure if ok with Dr Gala Romney.   Recheck in 3 weeks.

## 2018-02-06 ENCOUNTER — Ambulatory Visit (HOSPITAL_COMMUNITY)
Admission: RE | Admit: 2018-02-06 | Discharge: 2018-02-06 | Disposition: A | Discharge status: Home / Self Care | Payer: Medicare Other | Source: Ambulatory Visit | Attending: Internal Medicine | Admitting: Internal Medicine

## 2018-02-06 ENCOUNTER — Other Ambulatory Visit: Payer: Self-pay

## 2018-02-06 ENCOUNTER — Encounter (HOSPITAL_COMMUNITY)
Admission: RE | Disposition: A | Discharge status: Home / Self Care | Payer: Self-pay | Source: Ambulatory Visit | Attending: Internal Medicine

## 2018-02-06 ENCOUNTER — Encounter (HOSPITAL_COMMUNITY): Payer: Self-pay | Admitting: *Deleted

## 2018-02-06 DIAGNOSIS — I10 Essential (primary) hypertension: Secondary | ICD-10-CM | POA: Diagnosis not present

## 2018-02-06 DIAGNOSIS — G8929 Other chronic pain: Secondary | ICD-10-CM | POA: Insufficient documentation

## 2018-02-06 DIAGNOSIS — Z7901 Long term (current) use of anticoagulants: Secondary | ICD-10-CM | POA: Diagnosis not present

## 2018-02-06 DIAGNOSIS — G4730 Sleep apnea, unspecified: Secondary | ICD-10-CM | POA: Diagnosis not present

## 2018-02-06 DIAGNOSIS — Z8249 Family history of ischemic heart disease and other diseases of the circulatory system: Secondary | ICD-10-CM | POA: Diagnosis not present

## 2018-02-06 DIAGNOSIS — K5909 Other constipation: Secondary | ICD-10-CM

## 2018-02-06 DIAGNOSIS — Z7984 Long term (current) use of oral hypoglycemic drugs: Secondary | ICD-10-CM | POA: Diagnosis not present

## 2018-02-06 DIAGNOSIS — E785 Hyperlipidemia, unspecified: Secondary | ICD-10-CM | POA: Insufficient documentation

## 2018-02-06 DIAGNOSIS — D120 Benign neoplasm of cecum: Secondary | ICD-10-CM | POA: Diagnosis not present

## 2018-02-06 DIAGNOSIS — Z86010 Personal history of colonic polyps: Secondary | ICD-10-CM | POA: Insufficient documentation

## 2018-02-06 DIAGNOSIS — K5730 Diverticulosis of large intestine without perforation or abscess without bleeding: Secondary | ICD-10-CM | POA: Diagnosis not present

## 2018-02-06 DIAGNOSIS — Z1211 Encounter for screening for malignant neoplasm of colon: Secondary | ICD-10-CM | POA: Insufficient documentation

## 2018-02-06 DIAGNOSIS — Z79899 Other long term (current) drug therapy: Secondary | ICD-10-CM | POA: Insufficient documentation

## 2018-02-06 DIAGNOSIS — I480 Paroxysmal atrial fibrillation: Secondary | ICD-10-CM | POA: Insufficient documentation

## 2018-02-06 DIAGNOSIS — E119 Type 2 diabetes mellitus without complications: Secondary | ICD-10-CM | POA: Insufficient documentation

## 2018-02-06 DIAGNOSIS — K219 Gastro-esophageal reflux disease without esophagitis: Secondary | ICD-10-CM | POA: Diagnosis not present

## 2018-02-06 HISTORY — PX: COLONOSCOPY: SHX5424

## 2018-02-06 HISTORY — PX: POLYPECTOMY: SHX5525

## 2018-02-06 LAB — GLUCOSE, CAPILLARY: Glucose-Capillary: 117 mg/dL — ABNORMAL HIGH (ref 70–99)

## 2018-02-06 SURGERY — COLONOSCOPY
Anesthesia: Moderate Sedation

## 2018-02-06 MED ORDER — ONDANSETRON HCL 4 MG/2ML IJ SOLN
INTRAMUSCULAR | Status: DC | PRN
  Administered 2018-02-06: 4 mg via INTRAVENOUS

## 2018-02-06 MED ORDER — SODIUM CHLORIDE 0.9 % IV SOLN
INTRAVENOUS | Status: DC
  Administered 2018-02-06: 11:00:00 via INTRAVENOUS

## 2018-02-06 MED ORDER — MIDAZOLAM HCL 5 MG/5ML IJ SOLN
INTRAMUSCULAR | Status: AC
  Filled 2018-02-06: qty 10

## 2018-02-06 MED ORDER — STERILE WATER FOR IRRIGATION IR SOLN
Status: DC | PRN
  Administered 2018-02-06: 1.5 mL

## 2018-02-06 MED ORDER — MEPERIDINE HCL 100 MG/ML IJ SOLN
INTRAMUSCULAR | Status: DC | PRN
  Administered 2018-02-06: 25 mg

## 2018-02-06 MED ORDER — ONDANSETRON HCL 4 MG/2ML IJ SOLN
INTRAMUSCULAR | Status: AC
  Filled 2018-02-06: qty 2

## 2018-02-06 MED ORDER — MIDAZOLAM HCL 5 MG/5ML IJ SOLN
INTRAMUSCULAR | Status: DC | PRN
  Administered 2018-02-06 (×2): 1 mg via INTRAVENOUS
  Administered 2018-02-06: 2 mg via INTRAVENOUS
  Administered 2018-02-06: 1 mg via INTRAVENOUS

## 2018-02-06 MED ORDER — MEPERIDINE HCL 50 MG/ML IJ SOLN
INTRAMUSCULAR | Status: AC
  Filled 2018-02-06: qty 1

## 2018-02-06 NOTE — Discharge Instructions (Signed)
Colonoscopy Discharge Instructions  Read the instructions outlined below and refer to this sheet in the next few weeks. These discharge instructions provide you with general information on caring for yourself after you leave the hospital. Your doctor may also give you specific instructions. While your treatment has been planned according to the most current medical practices available, unavoidable complications occasionally occur. If you have any problems or questions after discharge, call Dr. Gala Romney at [phone]. ACTIVITY  You may resume your regular activity, but move at a slower pace for the next 24 hours.   Take frequent rest periods for the next 24 hours.   Walking will help get rid of the air and reduce the bloated feeling in your belly (abdomen).   No driving for 24 hours (because of the medicine (anesthesia) used during the test).    Do not sign any important legal documents or operate any machinery for 24 hours (because of the anesthesia used during the test).  NUTRITION  Drink plenty of fluids.   You may resume your normal diet as instructed by your doctor.   Begin with a light meal and progress to your normal diet. Heavy or fried foods are harder to digest and may make you feel sick to your stomach (nauseated).   Avoid alcoholic beverages for 24 hours or as instructed.  MEDICATIONS  You may resume your normal medications unless your doctor tells you otherwise.  WHAT YOU CAN EXPECT TODAY  Some feelings of bloating in the abdomen.   Passage of more gas than usual.   Spotting of blood in your stool or on the toilet paper.  IF YOU HAD POLYPS REMOVED DURING THE COLONOSCOPY:  No aspirin products for 7 days or as instructed.   No alcohol for 7 days or as instructed.   Eat a soft diet for the next 24 hours.  FINDING OUT THE RESULTS OF YOUR TEST Not all test results are available during your visit. If your test results are not back during the visit, make an appointment  with your caregiver to find out the results. Do not assume everything is normal if you have not heard from your caregiver or the medical facility. It is important for you to follow up on all of your test results.  SEEK IMMEDIATE MEDICAL ATTENTION IF:  You have more than a spotting of blood in your stool.   Your belly is swollen (abdominal distention).   You are nauseated or vomiting.   You have a temperature over 101.   You have abdominal pain or discomfort that is severe or gets worse throughout the day.    Diverticulosis and polyp information provided  Further recommendations to follow pending review of pathology report  Resume Coumadin today     Diverticulosis Diverticulosis is a condition that develops when small pouches (diverticula) form in the wall of the large intestine (colon). The colon is where water is absorbed and stool is formed. The pouches form when the inside layer of the colon pushes through weak spots in the outer layers of the colon. You may have a few pouches or many of them. What are the causes? The cause of this condition is not known. What increases the risk? The following factors may make you more likely to develop this condition:  Being older than age 9. Your risk for this condition increases with age. Diverticulosis is rare among people younger than age 46. By age 24, many people have it.  Eating a low-fiber diet.  Having  frequent constipation.  Being overweight.  Not getting enough exercise.  Smoking.  Taking over-the-counter pain medicines, like aspirin and ibuprofen.  Having a family history of diverticulosis.  What are the signs or symptoms? In most people, there are no symptoms of this condition. If you do have symptoms, they may include:  Bloating.  Cramps in the abdomen.  Constipation or diarrhea.  Pain in the lower left side of the abdomen.  How is this diagnosed? This condition is most often diagnosed during an exam for  other colon problems. Because diverticulosis usually has no symptoms, it often cannot be diagnosed independently. This condition may be diagnosed by:  Using a flexible scope to examine the colon (colonoscopy).  Taking an X-ray of the colon after dye has been put into the colon (barium enema).  Doing a CT scan.  How is this treated? You may not need treatment for this condition if you have never developed an infection related to diverticulosis. If you have had an infection before, treatment may include:  Eating a high-fiber diet. This may include eating more fruits, vegetables, and grains.  Taking a fiber supplement.  Taking a live bacteria supplement (probiotic).  Taking medicine to relax your colon.  Taking antibiotic medicines.  Follow these instructions at home:  Drink 6-8 glasses of water or more each day to prevent constipation.  Try not to strain when you have a bowel movement.  If you have had an infection before: ? Eat more fiber as directed by your health care provider or your diet and nutrition specialist (dietitian). ? Take a fiber supplement or probiotic, if your health care provider approves.  Take over-the-counter and prescription medicines only as told by your health care provider.  If you were prescribed an antibiotic, take it as told by your health care provider. Do not stop taking the antibiotic even if you start to feel better.  Keep all follow-up visits as told by your health care provider. This is important. Contact a health care provider if:  You have pain in your abdomen.  You have bloating.  You have cramps.  You have not had a bowel movement in 3 days. Get help right away if:  Your pain gets worse.  Your bloating becomes very bad.  You have a fever or chills, and your symptoms suddenly get worse.  You vomit.  You have bowel movements that are bloody or black.  You have bleeding from your rectum. Summary  Diverticulosis is a  condition that develops when small pouches (diverticula) form in the wall of the large intestine (colon).  You may have a few pouches or many of them.  This condition is most often diagnosed during an exam for other colon problems.  If you have had an infection related to diverticulosis, treatment may include increasing the fiber in your diet, taking supplements, or taking medicines. This information is not intended to replace advice given to you by your health care provider. Make sure you discuss any questions you have with your health care provider. Document Released: 11/18/2003 Document Revised: 01/10/2016 Document Reviewed: 01/10/2016 Elsevier Interactive Patient Education  2017 [place].    Colon Polyps Polyps are tissue growths inside the body. Polyps can grow in many places, including the large intestine (colon). A polyp may be a round bump or a mushroom-shaped growth. You could have one polyp or several. Most colon polyps are noncancerous (benign). However, some colon polyps can become cancerous over time. What are the causes? The exact  cause of colon polyps is not known. What increases the risk? This condition is more likely to develop in people who:  Have a family history of colon cancer or colon polyps.  Are older than 17 or older than 45 if they are African American.  Have inflammatory bowel disease, such as ulcerative colitis or Crohn disease.  Are overweight.  Smoke cigarettes.  Do not get enough exercise.  Drink too much alcohol.  Eat a diet that is: ? High in fat and red meat. ? Low in fiber.  Had childhood cancer that was treated with abdominal radiation.  What are the signs or symptoms? Most polyps do not cause symptoms. If you have symptoms, they may include:  Blood coming from your rectum when having a bowel movement.  Blood in your stool.The stool may look dark red or black.  A change in bowel habits, such as constipation or diarrhea.  How  is this diagnosed? This condition is diagnosed with a colonoscopy. This is a procedure that uses a lighted, flexible scope to look at the inside of your colon. How is this treated? Treatment for this condition involves removing any polyps that are found. Those polyps will then be tested for cancer. If cancer is found, your health care provider will talk to you about options for colon cancer treatment. Follow these instructions at home: Diet  Eat plenty of fiber, such as fruits, vegetables, and whole grains.  Eat foods that are high in calcium and vitamin D, such as milk, cheese, yogurt, eggs, liver, fish, and broccoli.  Limit foods high in fat, red meats, and processed meats, such as hot dogs, sausage, bacon, and lunch meats.  Maintain a healthy weight, or lose weight if recommended by your health care provider. General instructions  Do not smoke cigarettes.  Do not drink alcohol excessively.  Keep all follow-up visits as told by your health care provider. This is important. This includes keeping regularly scheduled colonoscopies. Talk to your health care provider about when you need a colonoscopy.  Exercise every day or as told by your health care provider. Contact a health care provider if:  You have new or worsening bleeding during a bowel movement.  You have new or increased blood in your stool.  You have a change in bowel habits.  You unexpectedly lose weight. This information is not intended to replace advice given to you by your health care provider. Make sure you discuss any questions you have with your health care provider. Document Released: 11/17/2003 Document Revised: 07/29/2015 Document Reviewed: 01/11/2015 Elsevier Interactive Patient Education  [employer].

## 2018-02-06 NOTE — H&P (Addendum)
@LOGO @   Primary Care Physician:  Redmond School, MD Primary Gastroenterologist:  Dr. Gala Romney  Pre-Procedure History & Physical: HPI:  Riley [name] is a 72 y.o. male here for surveillance colonoscopy.  History of colonic adenomas 2014.  Coumadin stopped on 1129 per plan.  Past Medical History:  Diagnosis Date  . Chronic anticoagulation   . Chronic back pain   . Diabetes mellitus, type 2 (HCC)    no insulin  . Edema   . GERD (gastroesophageal reflux disease)   . Hyperlipidemia   . Hypertension    normal coronary angiography and normal EF in 12/98  . 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 Paroxysmal atrial fibrillation ([place])    12/2005; [name]     Past Surgical History:  Procedure Laterality Date  . APPENDECTOMY  1998  . COLONOSCOPY N/A 11/26/2012   AGT:XMIWO 2 hemorrhoids. Pancolonic diverticulosis. Colonic polyps removed as described above. One hemostasis clip applied. TUBULAR ADENOMA. Surveillance Sept 2019.   . INSERTION OF MESH N/A 01/27/2013   Procedure: INSERTION OF MESH;  Surgeon: Jamesetta So, MD;  Location: [facility];  Service: General;  Laterality: N/A;  . TONSILLECTOMY     as a child  . UMBILICAL HERNIA REPAIR N/A 01/27/2013   Procedure: HERNIA REPAIR UMBILICAL ADULT;  Surgeon: Jamesetta So, MD;  Location: [facility];  Service: General;  Laterality: N/A;    Prior to Admission medications   Medication Sig Start Date End Date Taking? Authorizing Provider  acetaminophen (TYLENOL) 500 MG tablet Take 500 mg by mouth every 6 (six) hours as needed (for pain.).   Yes [provider]  chlorthalidone (HYGROTON) 25 MG tablet Take 25 mg by mouth daily as needed (for fluid retention).   Yes [provider]  DM-APAP-CPM (CORICIDIN HBP PO) Take 1 tablet by mouth daily as needed (for cold/sinus issues.).    Yes [provider]  doxazosin (CARDURA) 4 MG tablet TAKE (3) TABLETS BY MOUTH ONCE  DAILY. Patient taking differently: Take 12 mg by mouth every evening.  12/24/17  Yes Herminio Commons, MD  Hydrocortisone (PREPARATION H EX) Place 1 application rectally daily as needed (irritation/discomfort).    Yes [provider]  lisinopril (PRINIVIL,ZESTRIL) 40 MG tablet TAKE ONE TABLET BY MOUTH ONCE DAILY. Patient taking differently: Take 40 mg by mouth daily.  11/29/15  Yes Herminio Commons, MD  Magnesium Oxide 200 MG TABS Take 1 tablet (200 mg total) by mouth daily. 11/02/16  Yes Lendon Colonel, NP  polyethylene glycol powder (GLYCOLAX/MIRALAX) powder MIX 1 CAPFUL (17 GRAMS) WITH 8OZ OF WATER OR JUICE DAILY. Patient taking differently: Take 17 g by mouth daily as needed (for regularity/constipation).  10/03/16  Yes Annitta Needs, NP  pravastatin (PRAVACHOL) 20 MG tablet TAKE (1) TABLET BY MOUTH ONCE DAILY. Patient taking differently: Take 20 mg by mouth every evening.  07/16/17  Yes Herminio Commons, MD  ranitidine (ZANTAC) 150 MG tablet Take 150 mg by mouth 2 (two) times daily.     Yes [provider]  sitaGLIPtan-metformin (JANUMET) 50-1000 MG per tablet Take 1 tablet by mouth 2 (two) times daily with a meal.   Yes [provider]  Triamcinolone Acetonide (NASACORT ALLERGY 24HR NA) Place 1 spray into the nose daily as needed (congestion).   Yes [provider]  verapamil (CALAN-SR) 180 MG CR tablet TAKE (2) TABLETS BY MOUTH AT BEDTIME.  Patient taking differently: Take 360 mg by mouth at bedtime.  03/12/17  Yes Herminio Commons, MD  warfarin (COUMADIN) 5 MG tablet TAKE 1 TABLET DAILY, EXCEPT 1 1/2 TABLETS ON TUESDAYS, AND SATURDAYS. Patient taking differently: Take 5-7.5 mg by mouth See admin instructions. Take 1 tablet daily except 1 1/2 tablets on Tuesdays and Saturdays 12/31/17  Yes Herminio Commons, MD  Wheat Dextrin (BENEFIBER) POWD Take 1 Dose by mouth daily as needed (for regularity/constipation).    Yes [provider]   diltiazem (CARDIZEM) 30 MG tablet Take 1 tablet (30 mg total) by mouth daily as needed (Palpations). 01/01/18   Herminio Commons, MD    Allergies as of 12/20/2017  . (No Known Allergies)    Family History  Problem Relation Age of Onset  . Heart disease Father   . Heart disease Mother   . Bronchitis Mother   . Allergies Unknown        whole family  . Colon cancer Neg Hx     Social History   Socioeconomic History  . Marital status: Married    Spouse name: Not on file  . Number of children: Not on file  . Years of education: Not on file  . Highest education level: Not on file  Occupational History  . 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 Financial resource strain: Not on file  . Food insecurity:    Worry: Not on file    Inability: Not on file  . Transportation needs:    Medical: Not on file    Non-medical: Not on file  Tobacco Use  . Smoking status: Never Smoker  . Smokeless tobacco: Never Used  Substance and Sexual Activity  . Alcohol use: No    Alcohol/week: 0.0 standard drinks  . Drug use: No  . Sexual activity: Not on file  Lifestyle  . Physical activity:    Days per week: Not on file    Minutes per session: Not on file  . Stress: Not on file  Relationships  . Social connections:    Talks on phone: Not on file    Gets together: Not on file    Attends religious service: Not on file    Active member of club or organization: Not on file    Attends meetings of clubs or organizations: Not on file    Relationship status: Not on file  . Intimate partner violence:    Fear of current or ex partner: Not on file    Emotionally abused: Not on file    Physically abused: Not on file    Forced sexual activity: Not on file  Other Topics Concern  . Not on file  Social History Narrative  . Not on file    Review of Systems: See HPI, otherwise negative ROS  Physical Exam: There were no vitals  taken for this visit. General:   Alert,  Well-developed, well-nourished, pleasant and cooperative in NAD Lungs:  Clear throughout to auscultation.   No wheezes, crackles, or rhonchi. No acute distress. Heart:  Regular rate and rhythm; no murmurs, clicks, rubs,  or gallops. Abdomen: Non-distended, normal bowel sounds.  Soft and nontender without appreciable mass or hepatosplenomegaly.  Pulses:  Normal pulses noted. Extremities:  Without clubbing or edema.  Impression/Plan: Pleasant 72 year old gentleman here for surveillance colonoscopy.  History of colonic adenoma.  Coumadin stopped 5 days ago.  The risks, benefits, limitations, alternatives  and imponderables have been reviewed with the patient. Questions have been answered. All parties are agreeable.      Notice: This dictation was prepared with Dragon dictation along with smaller phrase technology. Any transcriptional errors that result from this process are unintentional and may not be corrected upon review.

## 2018-02-07 NOTE — Op Note (Signed)
[address] [facility] Patient Name: Riley [name] Procedure Date: 02/06/2018 11:11 AM MRN: [id number] Date of Birth: [birth year] Attending MD: Norvel Richards , MD CSN: [id number] Age: 72 Admit Type: Outpatient Procedure:                Colonoscopy Indications:              High risk colon cancer surveillance: Personal                            history of colonic polyps Providers:                Norvel Richards, MD, Lurline Del, RN, Gerome Sam, RN, Randa Spike, Technician Referring MD:              Medicines:                Meperidine 50 mg IV, Midazolam 5 mg IV, Ondansetron                            4 mg IV Complications:            No immediate complications. Estimated Blood Loss:     Estimated blood loss was minimal. Procedure:                Pre-Anesthesia Assessment:                           - Prior to the procedure, a History and Physical                            was performed, and patient medications and                            allergies were reviewed. The patient's tolerance of                            previous anesthesia was also reviewed. The risks                            and benefits of the procedure and the sedation                            options and risks were discussed with the patient.                            All questions were answered, and informed consent                            was obtained. Prior Anticoagulants: The patient                            last took Coumadin (warfarin) 4 days prior to the  procedure. ASA Grade Assessment: III - A patient                            with severe systemic disease. After reviewing the                            risks and benefits, the patient was deemed in                            satisfactory condition to undergo the procedure.                           After obtaining informed consent, the colonoscope                            was passed  under direct vision. Throughout the                            procedure, the patient's blood pressure, pulse, and                            oxygen saturations were monitored continuously. The                            CF-HQ190L ([id number]) scope was introduced through                            the anus and advanced to the the cecum, identified                            by appendiceal orifice and ileocecal valve. The                            colonoscopy was performed without difficulty. The                            patient tolerated the procedure well. The quality                            of the bowel preparation was adequate. The                            ileocecal valve, appendiceal orifice, and rectum                            were photographed. The ileocecal valve, appendiceal                            orifice, and rectum were photographed. The entire                            colon was well visualized. The colonoscopy was  performed without difficulty. The patient tolerated                            the procedure well. Scope In: 11:33:03 AM Scope Out: 11:47:45 AM Scope Withdrawal Time: 0 hours 9 minutes 19 seconds  Total Procedure Duration: 0 hours 14 minutes 42 seconds  Findings:      The perianal and digital rectal examinations were normal.      Two sessile polyps were found in the cecum. The polyps were 3 to 5 mm in       size. These polyps were removed with a cold snare. Resection and       retrieval were complete. Estimated blood loss was minimal.      Multiple medium-mouthed diverticula were found in the entire colon.      The exam was otherwise without abnormality on direct and retroflexion       views. Impression:               - Two 3 to 5 mm polyps in the cecum, removed with a                            cold snare. Resected and retrieved.                           - Diverticulosis in the entire examined colon.                            - The examination was otherwise normal on direct                            and retroflexion views. Moderate Sedation:      Moderate (conscious) sedation was administered by the endoscopy nurse       and supervised by the endoscopist. The following parameters were       monitored: oxygen saturation, heart rate, blood pressure, respiratory       rate, EKG, adequacy of pulmonary ventilation, and response to care.       Total physician intraservice time was 20 minutes. Recommendation:           - Patient has a contact number available for                            emergencies. The signs and symptoms of potential                            delayed complications were discussed with the                            patient. Return to normal activities tomorrow.                            Written discharge instructions were provided to the                            patient.                           -  Resume previous diet.                           - Continue present medications.                           - Repeat colonoscopy date to be determined after                            pending pathology results are reviewed for                            surveillance based on pathology results.                           - Return to GI office (date not yet determined). Procedure Code(s):        --- Professional ---                           [id number], Colonoscopy, flexible; with removal of                            tumor(s), polyp(s), or other lesion(s) by snare                            technique                           G0500, Moderate sedation services provided by the                            same physician or other qualified health care                            professional performing a gastrointestinal                            endoscopic service that sedation supports,                            requiring the presence of an independent trained                            observer to assist  in the monitoring of the                            patient's level of consciousness and physiological                            status; initial 15 minutes of intra-service time;                            patient age 9 years or older (additional time 43  be reported with [phone], as appropriate) Diagnosis Code(s):        --- Professional ---                           Z86.010, Personal history of colonic polyps                           D12.0, Benign neoplasm of cecum                           K57.30, Diverticulosis of large intestine without                            perforation or abscess without bleeding CPT copyright 2018 American Medical Association. All rights reserved. The codes documented in this report are preliminary and upon coder review may  be revised to meet current compliance requirements. Cristopher Estimable. Devlynn Knoff, MD Norvel Richards, MD 02/06/2018 11:53:30 AM This report has been signed electronically. Number of Addenda: 0

## 2018-02-10 ENCOUNTER — Encounter: Payer: Self-pay | Admitting: Internal Medicine

## 2018-02-11 ENCOUNTER — Other Ambulatory Visit: Payer: Self-pay | Admitting: Cardiovascular Disease

## 2018-02-12 ENCOUNTER — Encounter (HOSPITAL_COMMUNITY): Payer: Self-pay | Admitting: Internal Medicine

## 2018-02-18 ENCOUNTER — Other Ambulatory Visit: Payer: Self-pay | Admitting: Cardiovascular Disease

## 2018-02-20 ENCOUNTER — Ambulatory Visit (INDEPENDENT_AMBULATORY_CARE_PROVIDER_SITE_OTHER): Payer: Medicare Other | Admitting: *Deleted

## 2018-02-20 DIAGNOSIS — Z5181 Encounter for therapeutic drug level monitoring: Secondary | ICD-10-CM | POA: Diagnosis not present

## 2018-02-20 DIAGNOSIS — I480 Paroxysmal atrial fibrillation: Secondary | ICD-10-CM | POA: Diagnosis not present

## 2018-02-20 LAB — POCT INR: INR: 2.3 (ref 2.0–3.0)

## 2018-02-20 NOTE — Patient Instructions (Signed)
Continue coumadin 1 tablet daily except 1 1/2 tablets Tuesdays and Saturdays.   Recheck in 4 weeks.

## 2018-03-05 ENCOUNTER — Encounter: Payer: Self-pay | Admitting: *Deleted

## 2018-03-18 ENCOUNTER — Ambulatory Visit: Payer: Medicare Other | Admitting: Pulmonary Disease

## 2018-03-18 ENCOUNTER — Other Ambulatory Visit: Payer: Self-pay | Admitting: Cardiovascular Disease

## 2018-03-18 ENCOUNTER — Encounter: Payer: Self-pay | Admitting: Pulmonary Disease

## 2018-03-18 DIAGNOSIS — I480 Paroxysmal atrial fibrillation: Secondary | ICD-10-CM

## 2018-03-18 DIAGNOSIS — G4733 Obstructive sleep apnea (adult) (pediatric): Secondary | ICD-10-CM | POA: Diagnosis not present

## 2018-03-18 NOTE — Assessment & Plan Note (Signed)
Controlled. He has worked the night shift for many years

## 2018-03-18 NOTE — Progress Notes (Signed)
   Subjective:    Patient ID: Riley [name], male    DOB: 01-Nov-1945, 73 y.o.   MRN: [id number]  HPI   73 yo man with AFib followed for OSA   Chief Complaint  Patient presents with  . Follow-up    F/U for CPAP. 86yr. States his breathing has been ok since his last visit.    He had sinus issues for a while and used a full facemask but this seems to have improved and he has settled down with a nasal mask now.  No problems with pressure or dryness. Reports good compliance. This is objectively confirmed by download which shows compliance more than 5.5 hours per night with no missed nights with residual AHI of 3/hour on CPAP 11 cm  He is to work nights for 30 years and still has trouble sleeping continues.  Nights and goes to bed really late.  He takes a nap in the afternoons  Atrial fibrillation is paroxysmal  Significant tests/ events reviewed  2014 PSG >>He did not sleep well- TST 128 mins - only 2-3 hrs of sleep -RDI 37/h Started on autoCPAP 5-15>> changed to 11 cm  Past Medical History:  Diagnosis Date  . Chronic anticoagulation   . Chronic back pain   . Diabetes mellitus, type 2 (HCC)    no insulin  . Edema   . GERD (gastroesophageal reflux disease)   . Hyperlipidemia   . Hypertension    normal coronary angiography and normal EF in 12/98  . 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 Paroxysmal atrial fibrillation ([place])    12/2005; [name]      Review of Systems neg for any significant sore throat, dysphagia, itching, sneezing, nasal congestion or excess/ purulent secretions, fever, chills, sweats, unintended wt loss, pleuritic or exertional cp, hempoptysis, orthopnea pnd or change in chronic leg swelling. Also denies presyncope, palpitations, heartburn, abdominal pain, nausea, vomiting, diarrhea or change in bowel or urinary habits, dysuria,hematuria, rash, arthralgias, visual complaints, headache, numbness  weakness or ataxia.     Objective:   Physical Exam   Gen. Pleasant, elderly, well-nourished, in no distress ENT - no thrush, no pallor/icterus,no post nasal drip Neck: No JVD, no thyromegaly, no carotid bruits Lungs: no use of accessory muscles, no dullness to percussion, clear without rales or rhonchi  Cardiovascular: Rhythm irregular, heart sounds  normal, no murmurs or gallops, no peripheral edema Musculoskeletal: No deformities, no cyanosis or clubbing         Assessment & Plan:

## 2018-03-18 NOTE — Assessment & Plan Note (Signed)
Continue CPAP 11 cm with nasal mask this is working well and he has good compliance.    Weight loss encouraged, compliance with goal of at least 4-6 hrs every night is the expectation. Advised against medications with sedative side effects Cautioned against driving when sleepy - understanding that sleepiness will vary on a day to day basis

## 2018-03-18 NOTE — Patient Instructions (Signed)
CPAP supplies will be renewed  

## 2018-03-27 ENCOUNTER — Ambulatory Visit (INDEPENDENT_AMBULATORY_CARE_PROVIDER_SITE_OTHER): Payer: Medicare Other | Admitting: Pharmacist

## 2018-03-27 DIAGNOSIS — I480 Paroxysmal atrial fibrillation: Secondary | ICD-10-CM | POA: Diagnosis not present

## 2018-03-27 DIAGNOSIS — Z5181 Encounter for therapeutic drug level monitoring: Secondary | ICD-10-CM | POA: Diagnosis not present

## 2018-03-27 LAB — POCT INR: INR: 1.9 — AB (ref 2.0–3.0)

## 2018-03-27 NOTE — Patient Instructions (Signed)
Description   Take 1 1/2 tablets tonight then continue coumadin 1 tablet daily except 1 1/2 tablets Tuesdays and Saturdays.   Recheck in 4 weeks.

## 2018-04-30 ENCOUNTER — Other Ambulatory Visit (HOSPITAL_COMMUNITY): Payer: Self-pay | Admitting: Physician Assistant

## 2018-04-30 ENCOUNTER — Ambulatory Visit (HOSPITAL_COMMUNITY)
Admission: RE | Admit: 2018-04-30 | Discharge: 2018-04-30 | Disposition: A | Discharge status: Home / Self Care | Payer: Medicare Other | Source: Ambulatory Visit | Attending: Physician Assistant | Admitting: Physician Assistant

## 2018-04-30 DIAGNOSIS — M25561 Pain in right knee: Secondary | ICD-10-CM | POA: Insufficient documentation

## 2018-04-30 DIAGNOSIS — M79604 Pain in right leg: Secondary | ICD-10-CM | POA: Insufficient documentation

## 2018-05-01 ENCOUNTER — Ambulatory Visit: Payer: Medicare Other | Admitting: Cardiovascular Disease

## 2018-05-01 ENCOUNTER — Ambulatory Visit (INDEPENDENT_AMBULATORY_CARE_PROVIDER_SITE_OTHER): Payer: Medicare Other | Admitting: *Deleted

## 2018-05-01 ENCOUNTER — Encounter: Payer: Self-pay | Admitting: *Deleted

## 2018-05-01 VITALS — BP 160/78 | HR 59 | Ht 72.0 in | Wt 223.4 lb

## 2018-05-01 DIAGNOSIS — I10 Essential (primary) hypertension: Secondary | ICD-10-CM

## 2018-05-01 DIAGNOSIS — I25118 Atherosclerotic heart disease of native coronary artery with other forms of angina pectoris: Secondary | ICD-10-CM

## 2018-05-01 DIAGNOSIS — I480 Paroxysmal atrial fibrillation: Secondary | ICD-10-CM

## 2018-05-01 DIAGNOSIS — I4811 Longstanding persistent atrial fibrillation: Secondary | ICD-10-CM | POA: Diagnosis not present

## 2018-05-01 DIAGNOSIS — Z5181 Encounter for therapeutic drug level monitoring: Secondary | ICD-10-CM

## 2018-05-01 DIAGNOSIS — Z9989 Dependence on other enabling machines and devices: Secondary | ICD-10-CM

## 2018-05-01 DIAGNOSIS — E785 Hyperlipidemia, unspecified: Secondary | ICD-10-CM

## 2018-05-01 DIAGNOSIS — G4733 Obstructive sleep apnea (adult) (pediatric): Secondary | ICD-10-CM

## 2018-05-01 LAB — POCT INR: INR: 2.2 (ref 2.0–3.0)

## 2018-05-01 NOTE — Patient Instructions (Signed)
Continue coumadin 1 tablet daily except 1 1/2 tablets Tuesdays and Saturdays.   Recheck in 4 weeks.

## 2018-05-01 NOTE — Patient Instructions (Signed)

## 2018-05-01 NOTE — Progress Notes (Signed)
SUBJECTIVE: The patient presents for follow-up of persistent atrial fibrillation.  He is anticoagulated with warfarin.  He also has hypertension, hyperlipidemia, and sleep apnea for which she is on CPAP.  He underwent a low risk nuclear stress test on 11/09/2016. 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  ECG performed in the office today which I ordered and personally interpreted demonstrates normal sinus rhythm with right bundle branch block, left anterior fascicular block, and PVCs with baseline artifact.  He is here with his wife.  He recently had some right leg pain for which he had x-rays completed.  I reviewed them.  Right knee x-ray showed degenerative changes as did the lumbar spinal region.  He also had minimal right hip degenerative changes.  He began taking chlorthalidone every other day because when he stopped taking it for 2 weeks he developed bilateral leg swelling.  He had his blood pressure checked at other providers offices over the past 2 weeks and BP runs in the 120/50-60 range.  He denies chest pain and shortness of breath.  He has occasional palpitations.  He exercises lifting weights and rides on a recumbent bicycle.  Likes to stay active.   Review of Systems: As per "subjective", otherwise negative.  No Known Allergies  Current Outpatient Medications  Medication Sig Dispense Refill  . acetaminophen (TYLENOL) 500 MG tablet Take 500 mg by mouth every 6 (six) hours as needed (for pain.).    Marland Kitchen chlorthalidone (HYGROTON) 25 MG tablet Take 25 mg by mouth every other day.     . diltiazem (CARDIZEM) 30 MG tablet Take 1 tablet (30 mg total) by mouth daily as needed (Palpations). 30 tablet 11  . DM-APAP-CPM (CORICIDIN HBP PO) Take 1 tablet by mouth daily as needed (for cold/sinus issues.).     Marland Kitchen doxazosin (CARDURA) 4 MG tablet TAKE (3) TABLETS BY MOUTH ONCE DAILY.  (Patient taking differently: Take 12 mg by mouth every evening. ) 90 tablet 11  . Hydrocortisone (PREPARATION H EX) Place 1 application rectally daily as needed (irritation/discomfort).     Marland Kitchen lisinopril (PRINIVIL,ZESTRIL) 40 MG tablet Take 1 tablet (40 mg total) by mouth daily. 90 tablet 3  . Magnesium Oxide 200 MG TABS Take 1 tablet (200 mg total) by mouth daily. 30 each 11  . polyethylene glycol powder (GLYCOLAX/MIRALAX) powder MIX 1 CAPFUL (17 GRAMS) WITH 8OZ OF WATER OR JUICE DAILY. (Patient taking differently: Take 17 g by mouth daily as needed (for regularity/constipation). ) 527 g 3  . pravastatin (PRAVACHOL) 20 MG tablet TAKE (1) TABLET BY MOUTH ONCE DAILY. (Patient taking differently: Take 20 mg by mouth every evening. ) 90 tablet 3  . ranitidine (ZANTAC) 150 MG tablet Take 150 mg by mouth 2 (two) times daily.      . sitaGLIPtan-metformin (JANUMET) 50-1000 MG per tablet Take 1 tablet by mouth 2 (two) times daily with a meal.    . Triamcinolone Acetonide (NASACORT ALLERGY 24HR NA) Place 1 spray into the nose daily as needed (congestion).    . verapamil (CALAN-SR) 180 MG CR tablet TAKE (2) TABLETS BY MOUTH AT BEDTIME. 180 tablet 3  . warfarin (COUMADIN) 5 MG tablet TAKE 1 TABLET DAILY, EXCEPT 1 1/2 TABLETS ON TUESDAYS, AND SATURDAYS. 45 tablet 6  . Wheat Dextrin (BENEFIBER) POWD Take 1 Dose by mouth daily as needed (for regularity/constipation).      No current facility-administered medications for this  visit.     Past Medical History:  Diagnosis Date  . Chronic anticoagulation   . Chronic back pain   . Diabetes mellitus, type 2 (HCC)    no insulin  . Edema   . GERD (gastroesophageal reflux disease)   . Hyperlipidemia   . Hypertension    normal coronary angiography and normal EF in 12/98  . 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 Paroxysmal atrial fibrillation ([place])    12/2005; [name]     Past Surgical History:   Procedure Laterality Date  . APPENDECTOMY  1998  . COLONOSCOPY N/A 11/26/2012   PJK:DTOIZ 2 hemorrhoids. Pancolonic diverticulosis. Colonic polyps removed as described above. One hemostasis clip applied. TUBULAR ADENOMA. Surveillance Sept 2019.   Marland Kitchen COLONOSCOPY N/A 02/06/2018   Procedure: COLONOSCOPY;  Surgeon: Daneil Dolin, MD;  Location: [facility];  Service: Endoscopy;  Laterality: N/A;  12:00pm  . INSERTION OF MESH N/A 01/27/2013   Procedure: INSERTION OF MESH;  Surgeon: Jamesetta So, MD;  Location: [facility];  Service: General;  Laterality: N/A;  . POLYPECTOMY  02/06/2018   Procedure: POLYPECTOMY;  Surgeon: Daneil Dolin, MD;  Location: [facility];  Service: Endoscopy;;  (colon)  . TONSILLECTOMY     as a child  . UMBILICAL HERNIA REPAIR N/A 01/27/2013   Procedure: HERNIA REPAIR UMBILICAL ADULT;  Surgeon: Jamesetta So, MD;  Location: [facility];  Service: General;  Laterality: N/A;    Social History   Socioeconomic History  . Marital status: Married    Spouse name: Not on file  . Number of children: Not on file  . Years of education: Not on file  . Highest education level: Not on file  Occupational History  . 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 Financial resource strain: Not on file  . Food insecurity:    Worry: Not on file    Inability: Not on file  . Transportation needs:    Medical: Not on file    Non-medical: Not on file  Tobacco Use  . Smoking status: Never Smoker  . Smokeless tobacco: Never Used  Substance and Sexual Activity  . Alcohol use: No    Alcohol/week: 0.0 standard drinks  . Drug use: No  . Sexual activity: Not on file  Lifestyle  . Physical activity:    Days per week: Not on file    Minutes per session: Not on file  . Stress: Not on file  Relationships  . Social connections:    Talks on phone: Not on file    Gets together: Not on file    Attends religious service: Not on file      Active member of club or organization: Not on file    Attends meetings of clubs or organizations: Not on file    Relationship status: Not on file  . Intimate partner violence:    Fear of current or ex partner: Not on file    Emotionally abused: Not on file    Physically abused: Not on file    Forced sexual activity: Not on file  Other Topics Concern  . Not on file  Social History Narrative  . Not on file     Vitals:   05/01/18 1058  BP: (!) 160/78  Pulse: (!) 59  SpO2: 95%  Weight: 223 lb 6.4  oz (101.3 kg)  Height: 6' (1.829 m)    Wt Readings from Last 3 Encounters:  05/01/18 223 lb 6.4 oz (101.3 kg)  03/18/18 231 lb 12.8 oz (105.1 kg)  02/06/18 220 lb (99.8 kg)     PHYSICAL EXAM General: NAD HEENT: Normal. Neck: No JVD, no thyromegaly. Lungs: Clear to auscultation bilaterally with normal respiratory effort. CV: Regular rate and rhythm, normal S1/S2, no S3/S4, no murmur. No pretibial or periankle edema.  No carotid bruit.   Abdomen: Soft, nontender, no distention.  Neurologic: Alert and oriented.  Psych: Normal affect. Skin: Normal. Musculoskeletal: No gross deformities.    ECG: Reviewed above under Subjective   Labs: Lab Results  Component Value Date/Time   K 3.5 04/24/2017 11:58 AM   BUN 23 (H) 04/24/2017 11:58 AM   CREATININE 1.08 04/24/2017 11:58 AM   CREATININE 1.17 11/01/2016 08:49 AM   ALT 18 11/01/2016 08:49 AM   TSH 1.59 11/01/2016 08:49 AM   HGB 14.4 11/01/2016 08:49 AM     Lipids: Lab Results  Component Value Date/Time   LDLCALC 68 11/01/2016 08:49 AM   LDLCALC 48 07/30/2006   CHOL 133 11/01/2016 08:49 AM   TRIG 125 11/01/2016 08:49 AM   TRIG 92 and 07/30/2006   HDL 40 (L) 11/01/2016 08:49 AM       ASSESSMENT AND PLAN:  1.  Long-standing persistent atrial fibrillation: Symptomatically stable. He is tolerating warfarin without bleeding complications. I previously discussed alternative forms of anticoagulation with target specific  oral anticoagulants, but he preferred to stay on warfarin.Continue verapamil and prn diltiazem.  2. Essential HTN:BP is elevated today but has been running normal at other providers offices in the 120/50-60 range.  No changes to therapy.  3. Hyperlipidemia: On pravastatin.   4. Sleep apnea: Using CPAP.   5.  Presumed coronary disease: Symptomatically stable.  Nuclear stress test was low risk as detailed above.  Continue statin therapy.  No aspirin as he is on warfarin.    Disposition: Follow up 1 year   Kate Sable, M.D., F.A.C.C.

## 2018-05-28 ENCOUNTER — Telehealth: Payer: Self-pay | Admitting: Pharmacist

## 2018-05-28 NOTE — Telephone Encounter (Signed)

## 2018-05-29 ENCOUNTER — Ambulatory Visit (INDEPENDENT_AMBULATORY_CARE_PROVIDER_SITE_OTHER): Payer: Medicare Other | Admitting: *Deleted

## 2018-05-29 DIAGNOSIS — I480 Paroxysmal atrial fibrillation: Secondary | ICD-10-CM

## 2018-05-29 DIAGNOSIS — Z5181 Encounter for therapeutic drug level monitoring: Secondary | ICD-10-CM | POA: Diagnosis not present

## 2018-05-29 LAB — POCT INR: INR: 2.7 (ref 2.0–3.0)

## 2018-05-29 NOTE — Patient Instructions (Addendum)
Continue coumadin 1 tablet daily except 1 1/2 tablets Tuesdays and Saturdays.   Recheck in 6 weeks.  

## 2018-07-09 ENCOUNTER — Telehealth: Payer: Self-pay | Admitting: *Deleted

## 2018-07-09 NOTE — Telephone Encounter (Signed)

## 2018-07-10 ENCOUNTER — Other Ambulatory Visit: Payer: Self-pay

## 2018-07-10 ENCOUNTER — Ambulatory Visit (INDEPENDENT_AMBULATORY_CARE_PROVIDER_SITE_OTHER): Payer: Medicare Other | Admitting: *Deleted

## 2018-07-10 DIAGNOSIS — I480 Paroxysmal atrial fibrillation: Secondary | ICD-10-CM

## 2018-07-10 DIAGNOSIS — Z5181 Encounter for therapeutic drug level monitoring: Secondary | ICD-10-CM

## 2018-07-10 LAB — POCT INR: INR: 2.7 (ref 2.0–3.0)

## 2018-07-10 NOTE — Patient Instructions (Signed)
Continue coumadin 1 tablet daily except 1 1/2 tablets Tuesdays and Saturdays.   Recheck in 6 weeks.

## 2018-08-20 ENCOUNTER — Telehealth: Payer: Self-pay | Admitting: *Deleted

## 2018-08-20 NOTE — Telephone Encounter (Signed)

## 2018-08-21 ENCOUNTER — Ambulatory Visit (INDEPENDENT_AMBULATORY_CARE_PROVIDER_SITE_OTHER): Payer: Medicare Other | Admitting: *Deleted

## 2018-08-21 DIAGNOSIS — Z5181 Encounter for therapeutic drug level monitoring: Secondary | ICD-10-CM | POA: Diagnosis not present

## 2018-08-21 DIAGNOSIS — I480 Paroxysmal atrial fibrillation: Secondary | ICD-10-CM

## 2018-08-21 LAB — POCT INR: INR: 2.7 (ref 2.0–3.0)

## 2018-08-21 NOTE — Patient Instructions (Signed)
Continue coumadin 1 tablet daily except 1 1/2 tablets Tuesdays and Saturdays.   Recheck in 6 weeks.

## 2018-09-23 ENCOUNTER — Other Ambulatory Visit: Payer: Self-pay | Admitting: Cardiovascular Disease

## 2018-09-25 ENCOUNTER — Telehealth: Payer: Self-pay | Admitting: Cardiovascular Disease

## 2018-09-25 ENCOUNTER — Other Ambulatory Visit: Payer: Self-pay | Admitting: Cardiovascular Disease

## 2018-09-25 MED ORDER — CHLORTHALIDONE 25 MG PO TABS: ORAL_TABLET | ORAL | 3 refills | Status: DC

## 2018-09-25 NOTE — Telephone Encounter (Signed)
Refill on Chlorthalidone sent to [facility]

## 2018-09-25 NOTE — Telephone Encounter (Signed)
Refill complete 

## 2018-09-30 ENCOUNTER — Other Ambulatory Visit: Payer: Self-pay | Admitting: Cardiovascular Disease

## 2018-10-02 ENCOUNTER — Other Ambulatory Visit: Payer: Self-pay

## 2018-10-02 ENCOUNTER — Ambulatory Visit (INDEPENDENT_AMBULATORY_CARE_PROVIDER_SITE_OTHER): Payer: Medicare Other | Admitting: *Deleted

## 2018-10-02 DIAGNOSIS — Z5181 Encounter for therapeutic drug level monitoring: Secondary | ICD-10-CM | POA: Diagnosis not present

## 2018-10-02 DIAGNOSIS — I480 Paroxysmal atrial fibrillation: Secondary | ICD-10-CM

## 2018-10-02 LAB — POCT INR: INR: 2.2 (ref 2.0–3.0)

## 2018-10-02 NOTE — Patient Instructions (Signed)
Continue coumadin 1 tablet daily except 1 1/2 tablets Tuesdays and Saturdays.   Recheck in 6 weeks.

## 2018-10-08 ENCOUNTER — Telehealth: Payer: Self-pay | Admitting: Internal Medicine

## 2018-10-08 NOTE — Telephone Encounter (Signed)
Patient needs ov 10/09/18. Last seen since 02/2018.

## 2018-10-08 NOTE — Telephone Encounter (Signed)
Please call patient at [phone] He is having left side pain and constipation. I offered appt but the wife would rather speak with the nurse first.

## 2018-10-08 NOTE — Telephone Encounter (Signed)
Spoke with pt and spouse. He's been having some lt abdominal pain near his navel off and on for a couple weeks. Pt states that he is alternating Benefiber and Miralax. He isn't taking them on a daily basis. Pt has a bowel movement qod with straining. No diarrhea to report. He is also burping more and when asked what medication he is taking for Jerrye Bushy, pt stated Pepcid bid. Pt isn't sure if he needs to add something else to help his bowels move without straining and if that would help the abdominal pain. Pt also mention some nausea that comes and goes. Pt isn't taking otc or prescription meds for nausea.

## 2018-10-09 ENCOUNTER — Other Ambulatory Visit: Payer: Self-pay

## 2018-10-09 ENCOUNTER — Encounter: Payer: Self-pay | Admitting: Nurse Practitioner

## 2018-10-09 ENCOUNTER — Ambulatory Visit (INDEPENDENT_AMBULATORY_CARE_PROVIDER_SITE_OTHER): Payer: Medicare Other | Admitting: Nurse Practitioner

## 2018-10-09 VITALS — BP 157/93 | HR 64 | Temp 97.7°F | Ht 72.0 in | Wt 222.6 lb

## 2018-10-09 DIAGNOSIS — R1030 Lower abdominal pain, unspecified: Secondary | ICD-10-CM | POA: Diagnosis not present

## 2018-10-09 DIAGNOSIS — K5900 Constipation, unspecified: Secondary | ICD-10-CM | POA: Diagnosis not present

## 2018-10-09 DIAGNOSIS — R109 Unspecified abdominal pain: Secondary | ICD-10-CM | POA: Insufficient documentation

## 2018-10-09 NOTE — Assessment & Plan Note (Signed)
Left lower quadrant abdominal pain radiating around to the lower abdomen in the setting of chronic ongoing constipation despite attempts to manage with over-the-counter MiraLAX and Benefiber.  At this point we will start him on Linzess, as per above.  I feel his abdominal discomfort is likely due to his constipation.  Once his constipation is better treated we can reevaluate his abdominal pain and if no improvement plan for further evaluation.  His colonoscopy is up-to-date in 2019 with no overt concerns.  Recommend he follow-up in 2 months.

## 2018-10-09 NOTE — Telephone Encounter (Signed)
Spoke with pts spouse she is going to call back to see if pt can do the 11 AM with [facility]. Visit can be a telephone visit if wanted per LSL.

## 2018-10-09 NOTE — Patient Instructions (Signed)
Your health issues we discussed today were:   Constipation and abdominal pain: 1. I am giving you samples of Linzess 72 mcg.  Take this once a day, on an empty stomach, for sing in the morning 2. As we discussed, you may experience diarrhea.  It should resolve on its own in about 5 days or less 3. If diarrhea is severe or if it lasts longer than 7 days, let us know 4. Regardless, call us in 1 to 2 weeks and let us know if it is helping your bowel movements.  We can make further recommendations after that 5. Call us if you have any severe worsening symptoms  Overall I recommend:  1. Continue your other current medications 2. Return for follow-up in 2 months 3. Call us if you have any questions or concerns.   Because of recent events of COVID-19 ("Coronavirus"), follow CDC recommendations:  1. Wash your hand frequently 2. Avoid touching your face 3. Stay away from people who are sick 4. If you have symptoms such as fever, cough, shortness of breath then call your healthcare provider for further guidance 5. If you are sick, STAY AT HOME unless otherwise directed by your healthcare provider. 6. Follow directions from state and national officials regarding staying safe   At [facility] we value your feedback. You may receive a survey about your visit today. Please share your experience as we strive to create trusting relationships with our patients to provide genuine, compassionate, quality care.  We appreciate your understanding and patience as we review any laboratory studies, imaging, and other diagnostic tests that are ordered as we care for you. Our office policy is 5 business days for review of these results, and any emergent or urgent results are addressed in a timely manner for your best interest. If you do not hear from our office in 1 week, please contact us.   We also encourage the use of MyChart, which contains your medical information for your review as well. If  you are not enrolled in this feature, an access code is on this after visit summary for your convenience. Thank you for allowing [place] to be involved in your care.  It was great to see you today!  I hope you have a great summer!!

## 2018-10-09 NOTE — Progress Notes (Signed)
Referring Provider: Redmond School, MD Primary Care Physician:  Redmond School, MD Primary GI:  Dr. Gala Romney  Chief Complaint  Patient presents with   Abdominal Pain    left side pain x monday. comes/goes since.   Constipation    straining, hard stool, was alternating miralax and benefiber    HPI:   Riley [name] is a 73 y.o. male who presents for constipation and abdominal pain.  Patient called our office yesterday noting left abdominal pain intermittent for couple weeks, bowel movement every other day with straining despite Benefiber and MiraLAX alternating and not taking on a daily basis.  Also noted more frequent belching and started taking Pepcid twice daily.  Also intermittent nausea.  It was noted he has not been seen since 02/2018 and recommended an office visit.  Patient was last seen in our office 12/20/2017 for history of adenomatous colon polyp, GERD, constipation.  At that time he was noted he was due for surveillance colonoscopy.  History of GERD and constipation.  He had recently tried oral magnesium citrate on top of Benefiber and MiraLAX was overshot the endpoint and was advised to take Benefiber every other day and MiraLAX as needed.  At his last visit he noted he was taking Benefiber 1-1/2 teaspoons every other day alternating with MiraLAX two thirds capful every other day and still has intermittent straining and hard stools but no other difficulties.  Bowel movement every other day.  Preparation H 3 times a week for hemorrhoids.  Denies hematochezia.  Heartburn well controlled on Zantac.  Recommended adding half capful MiraLAX every other day to current Benefiber, MiraLAX regimen.  Consider switching to Pepcid off Zantac due to recent national recall.  Colonoscopy for surveillance purposes.  Colonoscopy was completed 02/06/2018 which found two 3 to 5 mm polyps in the cecum, diverticulosis in the entire examined colon, otherwise normal.  Surgical pathology, polyps to be  tubular adenoma.  Recommended no further colonoscopy unless symptoms develop.  Today he states he's doing ok overall. On Monday began with left-sided/LLQ pain that radiated to his lower abdomen. Has some mild relief around Monday evening. Had a recurrence Tuesday morning. Has the urge to have a bowel movement when his pain occurs. Has a bwoel movement every 2-3 days, small stools, hard, straining; will eventually "break loose" when he'll pass a hard stool and progress to soft/diarrhea. Some mild nausea, no vomiting. Denies hematochezia, melena, fever, chills, unintentional weight loss. Last night he felt warm (submjectively) but didn't take temperature. Temperature today in the office was normal. Denies URI or flu-like symptoms. Denies loss of sense of taste or smell. Denies chest pain, dyspnea, dizziness, lightheadedness, syncope, near syncope. Denies any other upper or lower GI symptoms.  Current bowel regimen is Miralax 3/4 dose in the evening on day; Benefiber 2 tsp the next day; he alternates these. Drinks plenty of fluids. Has never taken Rx constipation medication.  Past Medical History:  Diagnosis Date   Chronic anticoagulation    Chronic back pain    Diabetes mellitus, type 2 (HCC)    no insulin   Edema    GERD (gastroesophageal reflux disease)    Hyperlipidemia    Hypertension    normal coronary angiography and normal EF in 12/98   Nodule of left lung    Granuloma in left upper lobe   Paroxysmal atrial fibrillation ([place])    12/2005; sinus bradycardia secondary to medications   Sleep apnea     Past Surgical History:  Procedure Laterality Date   APPENDECTOMY  1998   COLONOSCOPY N/A 11/26/2012   [id number] 2 hemorrhoids. Pancolonic diverticulosis. Colonic polyps removed as described above. One hemostasis clip applied. TUBULAR ADENOMA. Surveillance Sept 2019.    COLONOSCOPY N/A 02/06/2018   Procedure: COLONOSCOPY;  Surgeon: Daneil Dolin, MD;  [facility];   Service: Endoscopy;  Laterality: N/A;  12:00pm   INSERTION OF MESH N/A 01/27/2013   Procedure: INSERTION OF MESH;  Surgeon: Jamesetta So, MD;  Location: [facility];  Service: General;  Laterality: N/A;   POLYPECTOMY  02/06/2018   Procedure: POLYPECTOMY;  Surgeon: Daneil Dolin, MD;  [facility];  Service: Endoscopy;;  (colon)   TONSILLECTOMY     as a child   UMBILICAL HERNIA REPAIR N/A 01/27/2013   Procedure: HERNIA REPAIR UMBILICAL ADULT;  Surgeon: Jamesetta So, MD;  Location: [facility];  Service: General;  Laterality: N/A;    Current Outpatient Medications  Medication Sig Dispense Refill   acetaminophen (TYLENOL) 500 MG tablet Take 500 mg by mouth every 6 (six) hours as needed (for pain.).     chlorthalidone (HYGROTON) 25 MG tablet TAKE (1) TABLET BY MOUTH ONCE DAILY. 90 tablet 3   diltiazem (CARDIZEM) 30 MG tablet Take 1 tablet (30 mg total) by mouth daily as needed (Palpations). 30 tablet 11   DM-APAP-CPM (CORICIDIN HBP PO) Take 1 tablet by mouth daily as needed (for cold/sinus issues.).      doxazosin (CARDURA) 4 MG tablet TAKE (3) TABLETS BY MOUTH ONCE DAILY. (Patient taking differently: Take 12 mg by mouth every evening. ) 90 tablet 11   Hydrocortisone (PREPARATION H EX) Place 1 application rectally daily as needed (irritation/discomfort).      lisinopril (PRINIVIL,ZESTRIL) 40 MG tablet Take 1 tablet (40 mg total) by mouth daily. 90 tablet 3   Magnesium Oxide 200 MG TABS Take 1 tablet (200 mg total) by mouth daily. 30 each 11   polyethylene glycol powder (GLYCOLAX/MIRALAX) powder MIX 1 CAPFUL (17 GRAMS) WITH 8OZ OF WATER OR JUICE DAILY. (Patient taking differently: Take 17 g by mouth daily as needed (for regularity/constipation). ) 527 g 3   pravastatin (PRAVACHOL) 20 MG tablet Take 1 tablet (20 mg total) by mouth every evening. 90 tablet 3   ranitidine (ZANTAC) 150 MG tablet Take 150 mg by mouth 2 (two) times daily.       sitaGLIPtan-metformin (JANUMET)  50-1000 MG per tablet Take 1 tablet by mouth 2 (two) times daily with a meal.     Triamcinolone Acetonide (NASACORT ALLERGY 24HR NA) Place 1 spray into the nose daily as needed (congestion).     verapamil (CALAN-SR) 180 MG CR tablet TAKE (2) TABLETS BY MOUTH AT BEDTIME. 180 tablet 3   warfarin (COUMADIN) 5 MG tablet TAKE 1 TABLET DAILY, EXCEPT 1 1/2 TABLETS ON TUESDAYS, AND SATURDAYS. 45 tablet 0   Wheat Dextrin (BENEFIBER) POWD Take 1 Dose by mouth daily as needed (for regularity/constipation).      No current facility-administered medications for this visit.     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  Social Designer, fashion/clothing strain: Not on file   Food insecurity    Worry: Not on file    Inability: Not on file   Transportation needs    Medical: Not on file    Non-medical: Not on file  Tobacco Use   Smoking status: Never Smoker   Smokeless tobacco: Never Used  Substance and Sexual Activity   Alcohol use: No    Alcohol/week: 0.0 standard drinks   Drug use: No   Sexual activity: Not on file  Lifestyle   Physical activity    Days per week: Not on file    Minutes per session: Not on file   Stress: Not on file  Relationships   Social connections    Talks on phone: Not on file    Gets together: Not on file    Attends religious service: Not on file    Active member of club or organization: Not on file    Attends meetings of clubs or  organizations: Not on file    Relationship status: Not on file  Other Topics Concern   Not on file  Social History Narrative   Not on file    Review of Systems: General: Negative for anorexia, weight loss, fever, chills, fatigue, weakness. ENT: Negative for hoarseness, difficulty swallowing. CV: Negative for chest pain, angina, palpitations, peripheral edema.  Respiratory: Negative for dyspnea at rest, cough, sputum, wheezing.  GI: See history of present illness. Endo: Negative for unusual weight change.  Heme: Negative for bruising or bleeding. Allergy: Negative for rash or hives.   Physical Exam: BP (!) 157/93    Pulse 64    Temp 97.7 F (36.5 C) (Oral)    Ht 6' (1.829 m)    Wt 222 lb 9.6 oz (101 kg)    BMI 30.19 kg/m  General:   Alert and oriented. Pleasant and cooperative. Well-nourished and well-developed.  Eyes:  Without icterus, sclera clear and conjunctiva pink.  Ears:  Normal auditory acuity. Cardiovascular:  S1, S2 present without murmurs appreciated. Extremities without clubbing or edema. Respiratory:  Clear to auscultation bilaterally. No wheezes, rales, or rhonchi. No distress.  Gastrointestinal:  +BS, soft, non-tender and non-distended. No HSM noted. No guarding or rebound. No masses appreciated.  Rectal:  Deferred  Musculoskalatal:  Symmetrical without gross deformities. Skin:  Intact without significant lesions or rashes. Neurologic:  Alert and oriented x4;  grossly normal neurologically. Psych:  Alert and cooperative. Normal mood and affect. Heme/Lymph/Immune: No excessive bruising noted.    10/09/2018 11:21 AM   Disclaimer: This note was dictated with voice recognition software. Similar sounding words can inadvertently be transcribed and may not be corrected upon review.

## 2018-10-09 NOTE — Assessment & Plan Note (Signed)
The patient describes worsening constipation.  This is associated with left lower quadrant abdominal pain radiating around to the lower abdomen.  He will go 2 to 3 days without a bowel movement and typically has small, hard stools that require straining.  Occasionally he will "cleanout" which starts with a hard stool followed by loose stools resulting in a very large bowel movement.  He has been on a Benefiber MiraLAX regimen and has made several titrations to this.  At this point given his ongoing symptoms despite best attempts at over-the-counter regimen I will start him on Linzess 72 mcg to see if this helps his constipation better.  We will provide samples and request a progress report.  Further recommendations depending on clinical response to Linzess.  Follow-up in 2 months.

## 2018-10-09 NOTE — Telephone Encounter (Signed)
Pt's spouse called back and pt is going to come in at 11:00 to see EG.

## 2018-10-10 NOTE — Progress Notes (Signed)
cc'd to pcp 

## 2018-10-11 ENCOUNTER — Telehealth: Payer: Self-pay | Admitting: Internal Medicine

## 2018-10-11 DIAGNOSIS — K5900 Constipation, unspecified: Secondary | ICD-10-CM

## 2018-10-11 NOTE — Telephone Encounter (Signed)
Pt would like Linzess 72 mcg sent to his pharmacy. Samples were given at apt last week.

## 2018-10-11 NOTE — Telephone Encounter (Signed)
[facility]- PLEASE SENT PATIENT A PRESCRIPTION OF LINZESS, TAKING THE SAMPLE GIVEN LAST WEEK AT APPOINTMENT

## 2018-10-14 ENCOUNTER — Telehealth: Payer: Self-pay | Admitting: Internal Medicine

## 2018-10-14 DIAGNOSIS — K5900 Constipation, unspecified: Secondary | ICD-10-CM

## 2018-10-14 MED ORDER — LINACLOTIDE 72 MCG PO CAPS: 72.0000 ug | ORAL_CAPSULE | Freq: Every day | ORAL | 5 refills | Status: DC

## 2018-10-14 MED ORDER — LINACLOTIDE 72 MCG PO CAPS: 72.0000 ug | ORAL_CAPSULE | Freq: Every day | ORAL | 3 refills | Status: DC

## 2018-10-14 NOTE — Telephone Encounter (Signed)
Rx sent to pharmacy per patient request. 

## 2018-10-14 NOTE — Telephone Encounter (Signed)
Pt would like Linzess 72 mcg sent to his pharmacy. Samples were given at apt last week.

## 2018-10-14 NOTE — Addendum Note (Signed)
Addended by: Annitta Needs on: 10/14/2018 04:40 PM   Modules accepted: Orders

## 2018-10-14 NOTE — Telephone Encounter (Signed)
Routing to refill box  

## 2018-10-14 NOTE — Addendum Note (Signed)
Addended by: Gordy Levan, Arienne Gartin A on: 10/14/2018 12:19 PM   Modules accepted: Orders

## 2018-10-14 NOTE — Telephone Encounter (Signed)
Forwarding to [facility]. 

## 2018-10-15 NOTE — Telephone Encounter (Signed)
Left Vm that Rx was sent in and to call if questions.

## 2018-11-05 ENCOUNTER — Telehealth: Payer: Self-pay | Admitting: Pharmacist

## 2018-11-05 NOTE — Telephone Encounter (Signed)
Called pt to discuss changing to Berger therapy due to better safety and efficacy data, especially in the setting of COVID pandemic to help reduce in-office visits. Spoke with patient's wife, she thinks $47/month copay will be cost prohibitive. She will double check with pt and will call us if he would like to change to Eliquis.

## 2018-11-13 ENCOUNTER — Ambulatory Visit (INDEPENDENT_AMBULATORY_CARE_PROVIDER_SITE_OTHER): Payer: Medicare Other | Admitting: *Deleted

## 2018-11-13 ENCOUNTER — Other Ambulatory Visit: Payer: Self-pay

## 2018-11-13 DIAGNOSIS — I480 Paroxysmal atrial fibrillation: Secondary | ICD-10-CM | POA: Diagnosis not present

## 2018-11-13 DIAGNOSIS — Z5181 Encounter for therapeutic drug level monitoring: Secondary | ICD-10-CM | POA: Diagnosis not present

## 2018-11-13 LAB — POCT INR: INR: 2.5 (ref 2.0–3.0)

## 2018-11-13 NOTE — Patient Instructions (Signed)
Continue coumadin 1 tablet daily except 1 1/2 tablets Tuesdays and Saturdays.   Recheck in 6 weeks.  

## 2018-11-18 ENCOUNTER — Other Ambulatory Visit: Payer: Self-pay | Admitting: Cardiovascular Disease

## 2018-12-11 ENCOUNTER — Other Ambulatory Visit: Payer: Self-pay

## 2018-12-11 ENCOUNTER — Ambulatory Visit: Payer: Medicare Other | Admitting: Nurse Practitioner

## 2018-12-11 ENCOUNTER — Encounter: Payer: Self-pay | Admitting: Nurse Practitioner

## 2018-12-11 VITALS — BP 153/75 | HR 55 | Temp 96.2°F | Ht 72.0 in | Wt 222.4 lb

## 2018-12-11 DIAGNOSIS — R1030 Lower abdominal pain, unspecified: Secondary | ICD-10-CM

## 2018-12-11 DIAGNOSIS — K5900 Constipation, unspecified: Secondary | ICD-10-CM

## 2018-12-11 DIAGNOSIS — K219 Gastro-esophageal reflux disease without esophagitis: Secondary | ICD-10-CM

## 2018-12-11 NOTE — Progress Notes (Signed)
Referring Provider: Redmond School, MD Primary Care Physician:  Redmond School, MD Primary GI:  Dr. Gala Romney  Chief Complaint  Patient presents with  . Constipation    "slightly" but better    HPI:   Riley [name] is a 73 y.o. male who presents for follow-up on constipation.  Patient was last seen in our office 10/09/2018 for the same as well as lower abdominal pain.  Previous history of adenomatous colon polyp, colonoscopy up-to-date December 2019 with recommended no further colonoscopy unless symptoms develop.  Typically having a bowel movement every 2 to 3 days with associated lower abdominal pain, small hard stools and straining.  When he does eventually "break loose" he does have soft stools/diarrhea.  Associated nausea.  Currently on three-quarter dose MiraLAX in the evening every other day alternated with Benefiber 2 teaspoons every other day.  Pended Linzess 72 mcg, notify us with progress report in 1 to 2 weeks, follow-up in 2 months.  Today he states he's doing well overall. Was on Zantac and was switched to famotadine which caused a lot of GI upset, has since stopped and no ongoing GERD symptoms. Constipation is significantly improved on Linzess 72 mcg and a half dose of MiraLAX daily. Was having abdominal discomfort on full MiraLAX and fiber; has stopped fiber. Has a bowel movement about every other day, not constipated other than intermittently when stool starts hard and then flows well after that. Denies abdominal pain, N/V, hematochezia, melena, fever, chills, unintentional weight loss. Denies URI or flu-like symptoms. Denies loss of sense of taste or smell. Denies chest pain, dyspnea, dizziness, lightheadedness, syncope, near syncope. Denies any other upper or lower GI symptoms.  Past Medical History:  Diagnosis Date  . Chronic anticoagulation   . Chronic back pain   . Diabetes mellitus, type 2 (HCC)    no insulin  . Edema   . GERD (gastroesophageal reflux disease)   .  Hyperlipidemia   . Hypertension    normal coronary angiography and normal EF in 12/98  . 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 Paroxysmal atrial fibrillation ([place])    12/2005; [name]     Past Surgical History:  Procedure Laterality Date  . APPENDECTOMY  1998  . COLONOSCOPY N/A 11/26/2012   [id number] hemorrhoids. Pancolonic diverticulosis. Colonic polyps removed as described above. One hemostasis clip applied. TUBULAR ADENOMA. Surveillance Sept 2019.   Marland Kitchen COLONOSCOPY N/A 02/06/2018   Procedure: COLONOSCOPY;  Surgeon: Daneil Dolin, MD;  [facility];  Service: Endoscopy;  Laterality: N/A;  12:00pm  . INSERTION OF MESH N/A 01/27/2013   Procedure: INSERTION OF MESH;  Surgeon: Jamesetta So, MD;  Location: [facility];  Service: General;  Laterality: N/A;  . POLYPECTOMY  02/06/2018   Procedure: POLYPECTOMY;  Surgeon: Daneil Dolin, MD;  [facility];  Service: Endoscopy;;  (colon)  . TONSILLECTOMY     as a child  . UMBILICAL HERNIA REPAIR N/A 01/27/2013   Procedure: HERNIA REPAIR UMBILICAL ADULT;  Surgeon: Jamesetta So, MD;  Location: [facility];  Service: General;  Laterality: N/A;    Current Outpatient Medications  Medication Sig Dispense Refill  . acetaminophen (TYLENOL) 500 MG tablet Take 500 mg by mouth every 6 (six) hours as needed (for pain.).    Marland Kitchen calcium carbonate (TUMS - DOSED IN MG ELEMENTAL CALCIUM) 500 MG chewable tablet Chew 1 tablet by  mouth as needed for indigestion or heartburn.    . chlorthalidone (HYGROTON) 25 MG tablet TAKE (1) TABLET BY MOUTH ONCE DAILY. (Patient taking differently: every other day. TAKE (1) TABLET BY MOUTH ONCE DAILY.) 90 tablet 3  . diltiazem (CARDIZEM) 30 MG tablet Take 1 tablet (30 mg total) by mouth daily as needed (Palpations). 30 tablet 11  . DM-APAP-CPM (CORICIDIN HBP PO) Take 1 tablet by mouth daily as needed (for cold/sinus issues.).     Marland Kitchen doxazosin  (CARDURA) 4 MG tablet TAKE (3) TABLETS BY MOUTH ONCE DAILY. 90 tablet 0  . glimepiride (AMARYL) 1 MG tablet Take 1 tablet by mouth daily.    . Hydrocortisone (PREPARATION H EX) Place 1 application rectally daily as needed (irritation/discomfort).     Marland Kitchen linaclotide (LINZESS) 72 MCG capsule Take 1 capsule (72 mcg total) by mouth daily before breakfast. 90 capsule 3  . lisinopril (PRINIVIL,ZESTRIL) 40 MG tablet Take 1 tablet (40 mg total) by mouth daily. 90 tablet 3  . Magnesium Oxide 200 MG TABS Take 1 tablet (200 mg total) by mouth daily. 30 each 11  . metFORMIN (GLUCOPHAGE) 1000 MG tablet Take 1 tablet by mouth 2 (two) times daily.    . polyethylene glycol powder (GLYCOLAX/MIRALAX) powder MIX 1 CAPFUL (17 GRAMS) WITH 8OZ OF WATER OR JUICE DAILY. (Patient taking differently: Take 8.5 g by mouth daily. ) 527 g 3  . pravastatin (PRAVACHOL) 20 MG tablet Take 1 tablet (20 mg total) by mouth every evening. 90 tablet 3  . Triamcinolone Acetonide (NASACORT ALLERGY 24HR NA) Place 1 spray into the nose daily as needed (congestion).    . verapamil (CALAN-SR) 180 MG CR tablet TAKE (2) TABLETS BY MOUTH AT BEDTIME. 180 tablet 3  . warfarin (COUMADIN) 5 MG tablet TAKE 1 TABLET DAILY, EXCEPT 1 1/2 TABLETS ON TUESDAYS, AND SATURDAYS. 45 tablet 0   No current facility-administered medications for this visit.     Allergies as of 12/11/2018  . (No Known Allergies)    Family History  Problem Relation Age of Onset  . Heart disease Father   . Heart disease Mother   . Bronchitis Mother   . Allergies Other        whole family  . Colon cancer Neg Hx     Social History   Socioeconomic History  . Marital status: Married    Spouse name: Not on file  . Number of children: Not on file  . Years of education: Not on file  . Highest education level: Not on file  Occupational History  . 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 Financial resource  strain: Not on file  . Food insecurity    Worry: Not on file    Inability: Not on file  . Transportation needs    Medical: Not on file    Non-medical: Not on file  Tobacco Use  . Smoking status: Never Smoker  . Smokeless tobacco: Never Used  Substance and Sexual Activity  . Alcohol use: No    Alcohol/week: 0.0 standard drinks  . Drug use: No  . Sexual activity: Not on file  Lifestyle  . Physical activity    Days per week: Not on file    Minutes per session: Not on file  . Stress: Not on file  Relationships  . Social Herbalist on phone: Not on file    Gets together: Not on file  Attends religious service: Not on file    Active member of club or organization: Not on file    Attends meetings of clubs or organizations: Not on file    Relationship status: Not on file  Other Topics Concern  . Not on file  Social History Narrative  . Not on file    Review of Systems: General: Negative for anorexia, weight loss, fever, chills, fatigue, weakness. ENT: Negative for hoarseness, difficulty swallowing. CV: Negative for chest pain, angina, palpitations, peripheral edema.  Respiratory: Negative for dyspnea at rest, cough, sputum, wheezing.  GI: See history of present illness. Endo: Negative for unusual weight change.  Heme: Negative for bruising or bleeding. Allergy: Negative for rash or hives.   Physical Exam: BP (!) 153/75   Pulse (!) 55   Temp (!) 96.2 F (35.7 C) (Temporal)   Ht 6' (1.829 m)   Wt 222 lb 6.4 oz (100.9 kg)   BMI 30.16 kg/m  General:   Alert and oriented. Pleasant and cooperative. Well-nourished and well-developed.  Eyes:  Without icterus, sclera clear and conjunctiva pink.  Ears:  Normal auditory acuity. Cardiovascular:  S1, S2 present without murmurs appreciated. Extremities without clubbing or edema. Respiratory:  Clear to auscultation bilaterally. No wheezes, rales, or rhonchi. No distress.  Gastrointestinal:  +BS, soft, non-tender and  non-distended. No HSM noted. No guarding or rebound. No masses appreciated.  Rectal:  Deferred  Musculoskalatal:  Symmetrical without gross deformities. Skin:  Intact without significant lesions or rashes. Neurologic:  Alert and oriented x4;  grossly normal neurologically. Psych:  Alert and cooperative. Normal mood and affect. Heme/Lymph/Immune: No excessive bruising noted.    12/11/2018 11:59 AM   Disclaimer: This note was dictated with voice recognition software. Similar sounding words can inadvertently be transcribed and may not be corrected upon review.

## 2018-12-11 NOTE — Assessment & Plan Note (Signed)
Constipation significantly improved on Linzess 72 mcg along with half a dose of MiraLAX every evening.  He does still have some mild constipation symptoms with initial hard stools followed by regular stools and eventually loose stools.  Feels he is 80% of the way.  At this point I will give him samples of Linzess 145 mcg.  Recommend he hold MiraLAX for now.  Call in 1 to 2 weeks and let us know if the higher dose Linzess is working any better.  If so we can send in a new prescription.  If not we could always go back to Linzess 72 mcg and MiraLAX.  Return for follow-up in 3 months.

## 2018-12-11 NOTE — Patient Instructions (Signed)
Your health issues we discussed today were:   Constipation with abdominal pain: 1. I am giving you samples of Linzess 145 mcg.  Take this once a day, on an empty stomach 2. While starting the higher dose of Linzess, stop taking MiraLAX for now 3. Call us in 1 to 2 weeks and let us know if it is helping your bowel movements any better 4. Let us know if any worsening or severe symptoms  GERD (reflux/heartburn): 1. You can continue to hold off on famotidine. 2. Let us know if you have any worsening or recurrent reflux symptoms and we can make recommendations for medications to treat this  Overall I recommend:  1. Continue your other current medications 2. Follow-up in 3 months 3. Call us if you have any questions or concerns.   Because of recent events of COVID-19 ("Coronavirus"), follow CDC recommendations:  1. Wash your hand frequently 2. Avoid touching your face 3. Stay away from people who are sick 4. If you have symptoms such as fever, cough, shortness of breath then call your healthcare provider for further guidance 5. If you are sick, STAY AT HOME unless otherwise directed by your healthcare provider. 6. Follow directions from state and national officials regarding staying safe   At [facility] we value your feedback. You may receive a survey about your visit today. Please share your experience as we strive to create trusting relationships with our patients to provide genuine, compassionate, quality care.  We appreciate your understanding and patience as we review any laboratory studies, imaging, and other diagnostic tests that are ordered as we care for you. Our office policy is 5 business days for review of these results, and any emergent or urgent results are addressed in a timely manner for your best interest. If you do not hear from our office in 1 week, please contact us.   We also encourage the use of MyChart, which contains your medical information for your  review as well. If you are not enrolled in this feature, an access code is on this after visit summary for your convenience. Thank you for allowing [place] to be involved in your care.  It was great to see you today!  I hope you have a great Fall!!

## 2018-12-11 NOTE — Assessment & Plan Note (Signed)
Abdominal pain significantly improved with control of constipation.  Does have occasional/rare abdominal discomfort with very mild associated constipation as per above.  We will make changes to his constipation regimen as per above which should help his abdominal pain.  Continue to monitor notify of any severe worsening symptoms.  Follow-up in 3 months.

## 2018-12-11 NOTE — Assessment & Plan Note (Signed)
GERD symptoms previously managed on Zantac which was pulled from the market.  He was started on Pepcid which caused GI upset.  He is cold [place] quit this and is not having any further GERD symptoms.  Recommend he continue to monitor and notify us of any recurrent GERD symptoms and we can make recommendations for treatment.  Follow-up in 3 months.

## 2018-12-16 ENCOUNTER — Other Ambulatory Visit: Payer: Self-pay | Admitting: Cardiovascular Disease

## 2018-12-18 ENCOUNTER — Telehealth: Payer: Self-pay | Admitting: Internal Medicine

## 2018-12-18 NOTE — Telephone Encounter (Signed)
Lets have him pick up samples of Linzess 290 mcg once daily. Let's give him 3 boxes. Please call with a progress report in 1-2 weeks to see if this is any better.

## 2018-12-18 NOTE — Telephone Encounter (Signed)
Spoke with pts spouse. Pt was given two samples of Linzess 145 mcg. Pt is feeling better with the abdominal pain. Pt d/c his Miralax and Benefiber as directed. Pt has taken 5 Linzess pills over the 5 days. Pt isn't having bowel movements daily. Pt's bowel movements are qod and he is having to strain to get it out. Please advise.

## 2018-12-18 NOTE — Telephone Encounter (Signed)
Pt's wife called to say that the patient was taking the Linzess samples to see how they would work and if he needed a prescription. She said patient patient wasn't feeling much better, but it had helped some. Please advise. [phone]

## 2018-12-18 NOTE — Telephone Encounter (Signed)
Noted. Spoke with pt's spouse. She is going to pick up samples of Linzess 290 mcg. Pt will call back with a pr in 1-2 weeks.

## 2018-12-25 ENCOUNTER — Ambulatory Visit (INDEPENDENT_AMBULATORY_CARE_PROVIDER_SITE_OTHER): Payer: Medicare Other | Admitting: *Deleted

## 2018-12-25 ENCOUNTER — Other Ambulatory Visit: Payer: Self-pay

## 2018-12-25 DIAGNOSIS — I480 Paroxysmal atrial fibrillation: Secondary | ICD-10-CM

## 2018-12-25 DIAGNOSIS — Z5181 Encounter for therapeutic drug level monitoring: Secondary | ICD-10-CM | POA: Diagnosis not present

## 2018-12-25 LAB — POCT INR: INR: 1.9 — AB (ref 2.0–3.0)

## 2018-12-25 NOTE — Patient Instructions (Signed)
Take 1 1/2 tablets tonight then resume 1 tablet daily except 1 1/2 tablets Tuesdays and Saturdays.   Recheck in 6 weeks

## 2018-12-26 ENCOUNTER — Other Ambulatory Visit: Payer: Self-pay | Admitting: Cardiovascular Disease

## 2018-12-30 ENCOUNTER — Telehealth: Payer: Self-pay | Admitting: *Deleted

## 2018-12-30 DIAGNOSIS — K5900 Constipation, unspecified: Secondary | ICD-10-CM

## 2018-12-30 NOTE — Telephone Encounter (Signed)
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

## 2018-12-30 NOTE — Telephone Encounter (Signed)
Wife called to let us know how pt is doing on his Linzess.  She said that he seems to be doing better since we increased the medication.  Can we send RX to [place]?  He only has 2 pills left of the samples we gave him.   [phone]

## 2019-01-01 ENCOUNTER — Telehealth: Payer: Self-pay | Admitting: Internal Medicine

## 2019-01-01 NOTE — Telephone Encounter (Signed)
Please call patient at [phone] regarding his Linzess

## 2019-01-01 NOTE — Telephone Encounter (Signed)
Spoke with pt's spouse. Pt wants to have the Linzess 290 mcg called into his pharmacy. The medication is working well for him.

## 2019-01-03 ENCOUNTER — Telehealth: Payer: Self-pay | Admitting: *Deleted

## 2019-01-03 MED ORDER — LINACLOTIDE 290 MCG PO CAPS: 290.0000 ug | ORAL_CAPSULE | Freq: Every day | ORAL | 5 refills | Status: DC

## 2019-01-03 NOTE — Telephone Encounter (Signed)
Pt's wife called in and said that [place] had not received a RX from [place] yet for Linzess 290 mcg.  Left 2 boxes of samples up front for her to pick up until provider has a chance to address RX.  Wife aware and will pick up samples  before 11:45 today.

## 2019-01-03 NOTE — Telephone Encounter (Signed)
Rx sent to pharmacy   

## 2019-01-03 NOTE — Addendum Note (Signed)
Addended by: Gordy Levan, ERIC A on: 01/03/2019 03:29 PM   Modules accepted: Orders

## 2019-01-06 NOTE — Telephone Encounter (Signed)
Noted  

## 2019-01-13 ENCOUNTER — Other Ambulatory Visit: Payer: Self-pay | Admitting: Cardiovascular Disease

## 2019-01-16 DIAGNOSIS — I501 Left ventricular failure: Secondary | ICD-10-CM | POA: Insufficient documentation

## 2019-01-16 DIAGNOSIS — R809 Proteinuria, unspecified: Secondary | ICD-10-CM | POA: Insufficient documentation

## 2019-01-22 ENCOUNTER — Other Ambulatory Visit: Payer: Self-pay

## 2019-01-22 DIAGNOSIS — Z20822 Contact with and (suspected) exposure to covid-19: Secondary | ICD-10-CM

## 2019-01-24 LAB — NOVEL CORONAVIRUS, NAA: SARS-CoV-2, NAA: NOT DETECTED

## 2019-02-05 ENCOUNTER — Ambulatory Visit (INDEPENDENT_AMBULATORY_CARE_PROVIDER_SITE_OTHER): Payer: Medicare Other | Admitting: *Deleted

## 2019-02-05 ENCOUNTER — Other Ambulatory Visit: Payer: Self-pay

## 2019-02-05 DIAGNOSIS — I480 Paroxysmal atrial fibrillation: Secondary | ICD-10-CM

## 2019-02-05 DIAGNOSIS — Z5181 Encounter for therapeutic drug level monitoring: Secondary | ICD-10-CM | POA: Diagnosis not present

## 2019-02-05 LAB — POCT INR: INR: 2.3 (ref 2.0–3.0)

## 2019-02-05 NOTE — Patient Instructions (Signed)
Continue warfarin 1 tablet daily except 1 1/2 tablets Tuesdays and Saturdays.   Recheck in 6 weeks.

## 2019-03-13 ENCOUNTER — Encounter: Payer: Self-pay | Admitting: Internal Medicine

## 2019-03-13 ENCOUNTER — Other Ambulatory Visit: Payer: Self-pay

## 2019-03-13 ENCOUNTER — Ambulatory Visit (INDEPENDENT_AMBULATORY_CARE_PROVIDER_SITE_OTHER): Payer: Medicare Other | Admitting: Nurse Practitioner

## 2019-03-13 ENCOUNTER — Encounter: Payer: Self-pay | Admitting: Nurse Practitioner

## 2019-03-13 DIAGNOSIS — K219 Gastro-esophageal reflux disease without esophagitis: Secondary | ICD-10-CM

## 2019-03-13 DIAGNOSIS — R1030 Lower abdominal pain, unspecified: Secondary | ICD-10-CM

## 2019-03-13 DIAGNOSIS — K5900 Constipation, unspecified: Secondary | ICD-10-CM | POA: Diagnosis not present

## 2019-03-13 NOTE — Patient Instructions (Signed)
Your health issues we discussed today were:   Constipation: 1. Continue taking Linzess 290 mcg once a day 2. As we discussed, increase your water intake and aim for 5 to 8 glasses of water or other water like beverages a day 3. Call us if you have any worsening or severe constipation  GERD (reflux/heartburn): 1. I am glad your symptoms are doing well! 2. Let us know if you have any worsening GERD symptoms 3. You can continue to use Tums as needed in the meantime  Overall I recommend:  1. Continue your other current medications 2. Return for follow-up in 6 months 3. Call us if you have any questions or concerns.   Because of recent events of COVID-19 ("Coronavirus"), follow CDC recommendations:  Wash your hand frequently Avoid touching your face Stay away from people who are sick If you have symptoms such as fever, cough, shortness of breath then call your healthcare provider for further guidance If you are sick, STAY AT HOME unless otherwise directed by your healthcare provider. Follow directions from state and national officials regarding staying safe   At [facility] we value your feedback. You may receive a survey about your visit today. Please share your experience as we strive to create trusting relationships with our patients to provide genuine, compassionate, quality care.  We appreciate your understanding and patience as we review any laboratory studies, imaging, and other diagnostic tests that are ordered as we care for you. Our office policy is 5 business days for review of these results, and any emergent or urgent results are addressed in a timely manner for your best interest. If you do not hear from our office in 1 week, please contact us.   We also encourage the use of MyChart, which contains your medical information for your review as well. If you are not enrolled in this feature, an access code is on this after visit summary for your convenience. Thank  you for allowing [place] to be involved in your care.  It was great to see you today!  I hope you have a Happy New Year!!

## 2019-03-13 NOTE — Assessment & Plan Note (Signed)
Well managed, rare TUMS needed. No PPI or H2RB. Monitor for worsening and let us know if this occurs.

## 2019-03-13 NOTE — Assessment & Plan Note (Signed)
Doing well on Linzess 290 daily. Occasional difficulty initiating a stool, but very much improved.  He admits he does not drink as much water as he typically does during the summer.  I recommended he try to increase his water intake for at least 5 to 8 glasses of water or water-like substance a day.  Continue current medications and follow-up in 6 months.

## 2019-03-13 NOTE — Assessment & Plan Note (Signed)
Abdominal pain significantly improved, almost resolved.  He does have "very rare" abdominal discomfort but not nearly as painful as it was and does not occur very often as well.  He states he cannot remember the last time he had problems.  Recommend he continue with current medications and follow-up in 6 months.

## 2019-03-13 NOTE — Progress Notes (Signed)
Referring Provider: Redmond School, MD Primary Care Physician:  Redmond School, MD Primary GI:  Dr. Gala Romney  NOTE: Service was provided via telemedicine and was requested by the patient due to COVID-19 pandemic.  Method of visit: Telephone  Patient Location: Home  Provider Location: Office  Reason for Phone Visit: Follow-up  The patient was consented to phone follow-up via telephone encounter including billing of the encounter (yes/no): Yes  Persons present on the phone encounter, with roles: Wife  Total time (minutes) spent on medical discussion: 23 minutes  Chief Complaint  Patient presents with  . Gastroesophageal Reflux    f/u. Doing okay  . Constipation    f/u. "a little" to get started but the linzess has helped a lot.     HPI:   Riley [name] is a 74 y.o. male who presents for virtual visit regarding: Follow-up on constipation and GERD.  Patient was last seen in our office 12/11/2018 for lower abdominal pain, GERD, constipation.  History of adenomatous colon polyp.  Colonoscopy up-to-date December 2019 with recommended no further colonoscopy unless symptoms develop due to age.  Previous attempt at three-quarter dose MiraLAX in the evening every other day alternated with Benefiber 2 teaspoons every other day.  He was also given Linzess 72 mcg samples with requested progress report.  At his last visit noted on Zantac and switch to famotidine which caused a lot of GI upset and he has since stopped taking and no ongoing GERD symptoms.  Constipation significantly improved on Linzess 72 mcg and a half dose of MiraLAX daily.  Fiber and full dose MiraLAX caused abdominal discomfort and bloating and has since stopped fiber.  Bowel movement about every other day with rare constipation breakthrough.  No other overt GI complaints.  Recommended increase Linzess to 145 mcg to attempt to eliminate need for MiraLAX.  Requested 1 to 2 weeks progress report, continue GERD monitoring, follow-up  in 3 months.  He called our office a week later stating he felt somewhat better but not great.  Recommended trial of Linzess 290 mcg daily.  The patient again to get this worked and a prescription was sent to the pharmacy.  Today he states he's doing ok overall. He's been laying low since the COVID-19 is flaring up. Minimal constipation with Linzess 290 mcg, typically very slight abdominal discomfort that is very rare. At times has some difficulty initiating a stool but once it goes it goes well. Not drinking as much water in the winter. Bloating has resolved off fiber and MiraLAX. GERD is doing well even off medications; rare TUMS as needed. Denies N/V, hematochezia, melena, fever, chills, unintentional weight loss. Denies URI or flu-like symptoms. Denies loss of sense of taste or smell. Denies chest pain, dyspnea, dizziness, lightheadedness, syncope, near syncope. Denies any other upper or lower GI symptoms.  Past Medical History:  Diagnosis Date  . Chronic anticoagulation   . Chronic back pain   . Diabetes mellitus, type 2 (HCC)    no insulin  . Edema   . GERD (gastroesophageal reflux disease)   . Hyperlipidemia   . Hypertension    normal coronary angiography and normal EF in 12/98  . 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 Paroxysmal atrial fibrillation ([place])    12/2005; [name]     Past Surgical History:  Procedure Laterality Date  . APPENDECTOMY  1998  . COLONOSCOPY N/A 11/26/2012  [id number] 2 hemorrhoids. Pancolonic diverticulosis. Colonic polyps removed as described above. One hemostasis clip applied. TUBULAR ADENOMA. Surveillance Sept 2019.   Marland Kitchen COLONOSCOPY N/A 02/06/2018   Procedure: COLONOSCOPY;  Surgeon: Daneil Dolin, MD;  Location: [facility];  Service: Endoscopy;  Laterality: N/A;  12:00pm  . INSERTION OF MESH N/A 01/27/2013   Procedure: INSERTION OF MESH;  Surgeon: Jamesetta So, MD;  Location: [facility]  ORS;  Service: General;  Laterality: N/A;  . POLYPECTOMY  02/06/2018   Procedure: POLYPECTOMY;  Surgeon: Daneil Dolin, MD;  Location: [facility] SU[facility];  Service: Endoscopy;;  (colon)  . TONSILLECTOMY     as a child  . UMBILICAL HERNIA REPAIR N/A 01/27/2013   Procedure: HERNIA REPAIR UMBILICAL ADULT;  Surgeon: Jamesetta So, MD;  Location: [facility] ORS;  Service: General;  Laterality: N/A;    Current Outpatient Medications  Medication Sig Dispense Refill  . acetaminophen (TYLENOL) 500 MG tablet Take 500 mg by mouth every 6 (six) hours as needed (for pain.).    Marland Kitchen calcium carbonate (TUMS - DOSED IN MG ELEMENTAL CALCIUM) 500 MG chewable tablet Chew 1 tablet by mouth as needed for indigestion or heartburn.    . chlorthalidone (HYGROTON) 25 MG tablet TAKE (1) TABLET BY MOUTH ONCE DAILY. (Patient taking differently: every other day. TAKE (1) TABLET BY MOUTH ONCE DAILY.) 90 tablet 3  . diltiazem (CARDIZEM) 30 MG tablet Take 1 tablet (30 mg total) by mouth daily as needed (Palpations). 30 tablet 11  . DM-APAP-CPM (CORICIDIN HBP PO) Take 1 tablet by mouth daily as needed (for cold/sinus issues.).     Marland Kitchen doxazosin (CARDURA) 4 MG tablet TAKE (3) TABLETS BY MOUTH ONCE DAILY. 90 tablet 6  . glimepiride (AMARYL) 1 MG tablet Take 1 tablet by mouth daily.    . Hydrocortisone (PREPARATION H EX) Place 1 application rectally daily as needed (irritation/discomfort).     Marland Kitchen linaclotide (LINZESS) 290 MCG CAPS capsule Take 1 capsule (290 mcg total) by mouth daily before breakfast. 30 capsule 5  . lisinopril (PRINIVIL,ZESTRIL) 40 MG tablet Take 1 tablet (40 mg total) by mouth daily. 90 tablet 3  . Magnesium Oxide 200 MG TABS Take 1 tablet (200 mg total) by mouth daily. 30 each 11  . metFORMIN (GLUCOPHAGE) 1000 MG tablet Take 1 tablet by mouth 2 (two) times daily.    . Multiple Vitamin (MULTIVITAMIN) tablet Take 1 tablet by mouth daily.    . pravastatin (PRAVACHOL) 20 MG tablet Take 1 tablet (20 mg total) by mouth every  evening. 90 tablet 3  . Triamcinolone Acetonide (NASACORT ALLERGY 24HR NA) Place 1 spray into the nose daily as needed (congestion).    . verapamil (CALAN-SR) 180 MG CR tablet TAKE (2) TABLETS BY MOUTH AT BEDTIME. 180 tablet 0  . warfarin (COUMADIN) 5 MG tablet TAKE 1 TABLET DAILY, EXCEPT 1 1/2 TABLETS ON TUESDAYS, AND SATURDAYS. 45 tablet 6   No current facility-administered medications for this visit.    Allergies as of 03/13/2019  . (No Known Allergies)    Family History  Problem Relation Age of Onset  . Heart disease Father   . Heart disease Mother   . Bronchitis Mother   . Allergies Other        whole family  . Colon cancer Neg Hx     Social History   Socioeconomic History  . Marital status: Married    Spouse name: Not on file  . Number of children: Not on file  .  Years of education: Not on file  . Highest education level: Not on file  Occupational History  . Occupation: Retired    Fish farm manager: [employer]    Comment: Company secretary  Tobacco Use  . Smoking status: Never Smoker  . Smokeless tobacco: Never Used  Substance and Sexual Activity  . Alcohol use: No    Alcohol/week: 0.0 standard drinks  . Drug use: No  . Sexual activity: Not on file  Other Topics Concern  . Not on file  Social History Narrative  . Not on file   Social Determinants of Health   Financial Resource Strain:   . Difficulty of Paying Living Expenses: Not on file  Food Insecurity:   . Worried About Charity fundraiser in the Last Year: Not on file  . Ran Out of Food in the Last Year: Not on file  Transportation Needs:   . Lack of Transportation (Medical): Not on file  . Lack of Transportation (Non-Medical): Not on file  Physical Activity:   . Days of Exercise per Week: Not on file  . Minutes of Exercise per Session: Not on file  Stress:   . Feeling of Stress : Not on file  Social Connections:   . Frequency of Communication with Friends and Family: Not on file  . Frequency  of Social Gatherings with Friends and Family: Not on file  . Attends Religious Services: Not on file  . Active Member of Clubs or Organizations: Not on file  . Attends Archivist Meetings: Not on file  . Marital Status: Not on file    Review of Systems: General: Negative for anorexia, weight loss, fever, chills, fatigue, weakness. Eyes: Negative for vision changes.  ENT: Negative for hoarseness, difficulty swallowing , nasal congestion. CV: Negative for chest pain, angina, palpitations, dyspnea on exertion, peripheral edema.  Respiratory: Negative for dyspnea at rest, dyspnea on exertion, cough, sputum, wheezing.  GI: See history of present illness. GU:  Negative for dysuria, hematuria, urinary incontinence, urinary frequency, nocturnal urination.  MS: Negative for joint pain, low back pain.  Derm: Negative for rash or itching.  Neuro: Negative for weakness, abnormal sensation, seizure, frequent headaches, memory loss, confusion.  Psych: Negative for anxiety, depression, suicidal ideation, hallucinations.  Endo: Negative for unusual weight change.  Heme: Negative for bruising or bleeding. Allergy: Negative for rash or hives.  Physical Exam: Note: limited exam due to virtual visit General:   Alert and oriented. Pleasant and cooperative. Well-nourished and well-developed.  Head:  Normocephalic and atraumatic. Eyes:  Without icterus, sclera clear and conjunctiva pink.  Ears:  Normal auditory acuity. Skin:  Intact without facial significant lesions or rashes. Neurologic:  Alert and oriented x4;  grossly normal neurologically. Psych:  Alert and cooperative. Normal mood and affect. Heme/Lymph/Immune: No excessive bruising noted.

## 2019-03-19 ENCOUNTER — Ambulatory Visit (INDEPENDENT_AMBULATORY_CARE_PROVIDER_SITE_OTHER): Payer: Medicare Other | Admitting: *Deleted

## 2019-03-19 ENCOUNTER — Other Ambulatory Visit: Payer: Self-pay

## 2019-03-19 DIAGNOSIS — I480 Paroxysmal atrial fibrillation: Secondary | ICD-10-CM

## 2019-03-19 DIAGNOSIS — Z5181 Encounter for therapeutic drug level monitoring: Secondary | ICD-10-CM

## 2019-03-19 LAB — POCT INR: INR: 2.4 (ref 2.0–3.0)

## 2019-03-19 NOTE — Patient Instructions (Signed)
Continue warfarin 1 tablet daily except 1 1/2 tablets Tuesdays and Saturdays.   Recheck in 6 weeks.

## 2019-04-28 ENCOUNTER — Other Ambulatory Visit: Payer: Self-pay | Admitting: Cardiovascular Disease

## 2019-04-30 ENCOUNTER — Other Ambulatory Visit: Payer: Self-pay

## 2019-04-30 ENCOUNTER — Ambulatory Visit (INDEPENDENT_AMBULATORY_CARE_PROVIDER_SITE_OTHER): Payer: Medicare Other | Admitting: *Deleted

## 2019-04-30 DIAGNOSIS — I480 Paroxysmal atrial fibrillation: Secondary | ICD-10-CM | POA: Diagnosis not present

## 2019-04-30 DIAGNOSIS — Z5181 Encounter for therapeutic drug level monitoring: Secondary | ICD-10-CM | POA: Diagnosis not present

## 2019-04-30 LAB — POCT INR: INR: 2 (ref 2.0–3.0)

## 2019-04-30 NOTE — Patient Instructions (Signed)
Take warfarin 1 1/2 tablets tonight then resume 1 tablet daily except 1 1/2 tablets Tuesdays and Saturdays.   Recheck in 6 weeks.

## 2019-05-23 ENCOUNTER — Other Ambulatory Visit: Payer: Self-pay

## 2019-05-23 NOTE — Telephone Encounter (Signed)
[place]. No answer, left message for [facility] to return call.

## 2019-05-23 NOTE — Telephone Encounter (Signed)
Riley [name] is calling on behalf of [employer].  Please call Riley [name] [phone]  Thanks renee

## 2019-05-26 ENCOUNTER — Telehealth: Payer: Self-pay | Admitting: Internal Medicine

## 2019-05-26 DIAGNOSIS — K5900 Constipation, unspecified: Secondary | ICD-10-CM

## 2019-05-26 MED ORDER — VERAPAMIL HCL ER 180 MG PO TBCR: EXTENDED_RELEASE_TABLET | ORAL | 0 refills | Status: DC

## 2019-05-26 MED ORDER — DOXAZOSIN MESYLATE 4 MG PO TABS: ORAL_TABLET | ORAL | 1 refills | Status: DC

## 2019-05-26 MED ORDER — LISINOPRIL 40 MG PO TABS: 40.0000 mg | ORAL_TABLET | Freq: Every day | ORAL | 0 refills | Status: DC

## 2019-05-26 NOTE — Telephone Encounter (Signed)
Holly from [place] called to let us know that patient was changing pharmacy and he needs his Linzess prescription sent to [facility]. I asked if he needed a refill and she said, no, but she did want the nurse to call her back. Please call Earnest Bailey at [facility] at [phone]

## 2019-05-26 NOTE — Telephone Encounter (Signed)
Lmom for [facility] to return my call.

## 2019-05-26 NOTE — Telephone Encounter (Signed)
Spoke with [facility] requested that Cardura, lisinopril, verapamil, and warfarin be sent to [facility]. Refills sent to [facility].

## 2019-05-27 NOTE — Telephone Encounter (Signed)
Pt needs Linzess 290 mcg [facility]. They are waiting to deliver pts medication and need an RX to fill pts medication.

## 2019-05-28 MED ORDER — LINACLOTIDE 290 MCG PO CAPS: 290.0000 ug | ORAL_CAPSULE | Freq: Every day | ORAL | 5 refills | Status: DC

## 2019-05-28 NOTE — Addendum Note (Signed)
Addended by: Gordy Levan, Devann Cribb A on: 05/28/2019 01:52 PM   Modules accepted: Orders

## 2019-05-28 NOTE — Telephone Encounter (Signed)
Rx sent per request. 

## 2019-05-28 NOTE — Telephone Encounter (Signed)
Noted  

## 2019-05-30 ENCOUNTER — Telehealth: Payer: Self-pay

## 2019-05-30 MED ORDER — WARFARIN SODIUM 5 MG PO TABS: ORAL_TABLET | ORAL | 2 refills | Status: DC

## 2019-05-30 NOTE — Telephone Encounter (Signed)
Called Holly from [facility]. Informed her that I sent in a prescription for warfarin. She stated that they would be able to get the warfarin to the pt before he runs out on Saturday.

## 2019-05-30 NOTE — Telephone Encounter (Signed)
Per [place] Pt will be out of warfarin soon.  Please call Earnest Bailey [phone]  Thanks renee

## 2019-06-11 ENCOUNTER — Ambulatory Visit (INDEPENDENT_AMBULATORY_CARE_PROVIDER_SITE_OTHER): Payer: Medicare Other | Admitting: *Deleted

## 2019-06-11 ENCOUNTER — Other Ambulatory Visit: Payer: Self-pay

## 2019-06-11 DIAGNOSIS — I480 Paroxysmal atrial fibrillation: Secondary | ICD-10-CM | POA: Diagnosis not present

## 2019-06-11 DIAGNOSIS — Z5181 Encounter for therapeutic drug level monitoring: Secondary | ICD-10-CM | POA: Diagnosis not present

## 2019-06-11 LAB — POCT INR: INR: 1.7 — AB (ref 2.0–3.0)

## 2019-06-11 MED ORDER — DOXAZOSIN MESYLATE 4 MG PO TABS: ORAL_TABLET | ORAL | 3 refills | Status: DC

## 2019-06-11 MED ORDER — VERAPAMIL HCL ER 180 MG PO TBCR: EXTENDED_RELEASE_TABLET | ORAL | 3 refills | Status: DC

## 2019-06-11 NOTE — Patient Instructions (Signed)
Take warfarin 1 1/2 tablets tonight and tomorrow night then increase dose to 1 tablet daily except 1 1/2 tablets Tuesdays, Thursdays and Saturdays.  Had started MVI with Vit K.  Will change to one w/o Vit K.  Recheck in 3 weeks.

## 2019-06-11 NOTE — Addendum Note (Signed)
Addended by: Malen Gauze on: 06/11/2019 11:55 AM   Modules accepted: Orders

## 2019-06-16 ENCOUNTER — Ambulatory Visit: Payer: Medicare Other | Admitting: Cardiovascular Disease

## 2019-06-16 ENCOUNTER — Other Ambulatory Visit: Payer: Self-pay

## 2019-06-16 ENCOUNTER — Encounter: Payer: Self-pay | Admitting: Cardiovascular Disease

## 2019-06-16 VITALS — BP 124/76 | HR 57 | Temp 97.6°F | Ht 72.0 in | Wt 227.0 lb

## 2019-06-16 DIAGNOSIS — I4811 Longstanding persistent atrial fibrillation: Secondary | ICD-10-CM | POA: Diagnosis not present

## 2019-06-16 DIAGNOSIS — G4733 Obstructive sleep apnea (adult) (pediatric): Secondary | ICD-10-CM

## 2019-06-16 DIAGNOSIS — I25118 Atherosclerotic heart disease of native coronary artery with other forms of angina pectoris: Secondary | ICD-10-CM

## 2019-06-16 DIAGNOSIS — Z9989 Dependence on other enabling machines and devices: Secondary | ICD-10-CM

## 2019-06-16 DIAGNOSIS — I10 Essential (primary) hypertension: Secondary | ICD-10-CM | POA: Diagnosis not present

## 2019-06-16 DIAGNOSIS — E785 Hyperlipidemia, unspecified: Secondary | ICD-10-CM

## 2019-06-16 NOTE — Progress Notes (Signed)
SUBJECTIVE: The patient presents for follow-up of persistent atrial fibrillation and presumed coronary artery disease.  He is anticoagulated with warfarin.  He also has hypertension, hyperlipidemia, and sleep apnea for which he is on CPAP.  He underwent a low risk nuclear stress test on 11/09/2016. 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  The patient denies any symptoms of chest pain, shortness of breath, lightheadedness, dizziness, orthopnea, PND, and syncope.  He very seldom has palpitations.  He takes chlorthalidone as needed for ankle swelling which is also seldom in nature.   Review of Systems: As per "subjective", otherwise negative.  No Known Allergies  Current Outpatient Medications  Medication Sig Dispense Refill  . acetaminophen (TYLENOL) 500 MG tablet Take 500 mg by mouth every 6 (six) hours as needed (for pain.).    Marland Kitchen calcium carbonate (TUMS - DOSED IN MG ELEMENTAL CALCIUM) 500 MG chewable tablet Chew 1 tablet by mouth as needed for indigestion or heartburn.    . chlorthalidone (HYGROTON) 25 MG tablet TAKE (1) TABLET BY MOUTH ONCE DAILY. (Patient taking differently: every other day. TAKE (1) TABLET BY MOUTH ONCE DAILY.) 90 tablet 3  . diltiazem (CARDIZEM) 30 MG tablet Take 1 tablet (30 mg total) by mouth daily as needed (Palpations). 30 tablet 11  . DM-APAP-CPM (CORICIDIN HBP PO) Take 1 tablet by mouth daily as needed (for cold/sinus issues.).     Marland Kitchen doxazosin (CARDURA) 4 MG tablet TAKE (3) TABLETS BY MOUTH ONCE DAILY. 270 tablet 3  . glimepiride (AMARYL) 1 MG tablet Take 1 tablet by mouth daily.    . Hydrocortisone (PREPARATION H EX) Place 1 application rectally daily as needed (irritation/discomfort).     Marland Kitchen linaclotide (LINZESS) 290 MCG CAPS capsule Take 1 capsule (290 mcg total) by mouth daily before breakfast. 30 capsule 5  . lisinopril (ZESTRIL) 40 MG tablet Take 1  tablet (40 mg total) by mouth daily. Please call and schedule an appt for further refills 1st attempt 30 tablet 0  . Magnesium Oxide 200 MG TABS Take 1 tablet (200 mg total) by mouth daily. 30 each 11  . metFORMIN (GLUCOPHAGE) 1000 MG tablet Take 1 tablet by mouth 2 (two) times daily.    . Multiple Vitamin (MULTIVITAMIN) tablet Take 1 tablet by mouth daily.    . pravastatin (PRAVACHOL) 20 MG tablet Take 1 tablet (20 mg total) by mouth every evening. 90 tablet 3  . Triamcinolone Acetonide (NASACORT ALLERGY 24HR NA) Place 1 spray into the nose daily as needed (congestion).    . verapamil (CALAN-SR) 180 MG CR tablet TAKE (2) TABLETS BY MOUTH AT BEDTIME. 180 tablet 3  . warfarin (COUMADIN) 5 MG tablet Take as directed by the [facility] 40 tablet 2   No current facility-administered medications for this visit.    Past Medical History:  Diagnosis Date  . Chronic anticoagulation   . Chronic back pain   . Diabetes mellitus, type 2 (HCC)    no insulin  . Edema   . GERD (gastroesophageal reflux disease)   . Hyperlipidemia   . Hypertension    normal coronary angiography and normal EF in 12/98  . 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 Paroxysmal atrial fibrillation ([place])    12/2005; [name]     Past Surgical History:  Procedure Laterality Date  . APPENDECTOMY  1998  . COLONOSCOPY N/A 11/26/2012   [id number] 2 hemorrhoids. Pancolonic diverticulosis. Colonic polyps removed as described above. One hemostasis clip applied. TUBULAR ADENOMA. Surveillance Sept 2019.   Marland Kitchen COLONOSCOPY N/A 02/06/2018   Procedure: COLONOSCOPY;  Surgeon: Daneil Dolin, MD;  Location: [facility];  Service: Endoscopy;  Laterality: N/A;  12:00pm  . INSERTION OF MESH N/A 01/27/2013   Procedure: INSERTION OF MESH;  Surgeon: Jamesetta So, MD;  Location: [facility] ORS;  Service: General;  Laterality: N/A;  . POLYPECTOMY  02/06/2018   Procedure: POLYPECTOMY;   Surgeon: Daneil Dolin, MD;  Location: [facility];  Service: Endoscopy;;  (colon)  . TONSILLECTOMY     as a child  . UMBILICAL HERNIA REPAIR N/A 01/27/2013   Procedure: HERNIA REPAIR UMBILICAL ADULT;  Surgeon: Jamesetta So, MD;  Location: [facility];  Service: General;  Laterality: N/A;    Social History   Socioeconomic History  . Marital status: Married    Spouse name: Not on file  . Number of children: Not on file  . Years of education: Not on file  . Highest education level: Not on file  Occupational History  . Occupation: Retired    Fish farm manager: [employer]    Comment: Company secretary  Tobacco Use  . Smoking status: Never Smoker  . Smokeless tobacco: Never Used  Substance and Sexual Activity  . Alcohol use: No    Alcohol/week: 0.0 standard drinks  . Drug use: No  . Sexual activity: Not on file  Other Topics Concern  . Not on file  Social History Narrative  . Not on file   Social Determinants of Health   Financial Resource Strain:   . Difficulty of Paying Living Expenses:   Food Insecurity:   . Worried About Charity fundraiser in the Last Year:   . Arboriculturist in the Last Year:   Transportation Needs:   . Film/video editor (Medical):   Marland Kitchen Lack of Transportation (Non-Medical):   Physical Activity:   . Days of Exercise per Week:   . Minutes of Exercise per Session:   Stress:   . Feeling of Stress :   Social Connections:   . Frequency of Communication with Friends and Family:   . Frequency of Social Gatherings with Friends and Family:   . Attends Religious Services:   . Active Member of Clubs or Organizations:   . Attends Archivist Meetings:   Marland Kitchen Marital Status:   Intimate Partner Violence:   . Fear of Current or Ex-Partner:   . Emotionally Abused:   Marland Kitchen Physically Abused:   . Sexually Abused:     Vitals:   06/16/19 1520  BP: 124/76  Pulse: (!) 57  Temp: 97.6 F (36.4 C)  SpO2: 98%  Weight: 227 lb (103 kg)  Height: 6'  (1.829 m)    Wt Readings from Last 3 Encounters:  06/16/19 227 lb (103 kg)  12/11/18 222 lb 6.4 oz (100.9 kg)  10/09/18 222 lb 9.6 oz (101 kg)     PHYSICAL EXAM General: NAD HEENT: Normal. Neck: No JVD, no thyromegaly. Lungs: Clear to auscultation bilaterally with normal respiratory effort. CV: Regular rate and irregular rhythm, normal S1/S2, no S3, no murmur. No pretibial or periankle edema.  No carotid bruit.   Abdomen: Soft, nontender, no distention.  Neurologic: Alert and oriented.  Psych: Normal affect. Skin: Normal. Musculoskeletal: No gross deformities.      Labs: Lab Results  Component  Value Date/Time   K 3.5 04/24/2017 11:58 AM   BUN 23 (H) 04/24/2017 11:58 AM   CREATININE 1.08 04/24/2017 11:58 AM   CREATININE 1.17 11/01/2016 08:49 AM   ALT 18 11/01/2016 08:49 AM   TSH 1.59 11/01/2016 08:49 AM   HGB 14.4 11/01/2016 08:49 AM     Lipids: Lab Results  Component Value Date/Time   LDLCALC 68 11/01/2016 08:49 AM   LDLCALC 48 07/30/2006 12:00 AM   CHOL 133 11/01/2016 08:49 AM   TRIG 125 11/01/2016 08:49 AM   TRIG 92 and 07/30/2006 12:00 AM   HDL 40 (L) 11/01/2016 08:49 AM       ASSESSMENT AND PLAN:  1.Long-standing persistentatrial fibrillation: Symptomatically stable. He is tolerating warfarin without bleeding complications. I previously discussed alternative forms of anticoagulation with target specific oral anticoagulants, but he preferred to stay on warfarin.Continue verapamil and prn diltiazem.  2. Essential HTN:BP is normal.  No changes to therapy.  3. Hyperlipidemia: On pravastatin.   4. Sleep apnea: Using CPAP.  5. Presumed coronary disease: Symptomatically stable. Nuclear stress test was low risk as detailed above. Continue statin therapy. No aspirin as he is on warfarin.   Disposition: Follow up 1 yr   Kate Sable, M.D., F.A.C.C.

## 2019-06-16 NOTE — Patient Instructions (Signed)
Medication Instructions:  Your physician recommends that you continue on your current medications as directed. Please refer to the Current Medication list given to you today.  *If you need a refill on your cardiac medications before your next appointment, please call your pharmacy*   Lab Work: None today If you have labs (blood work) drawn today and your tests are completely normal, you will receive your results only by: . MyChart Message (if you have MyChart) OR . A paper copy in the mail If you have any lab test that is abnormal or we need to change your treatment, we will call you to review the results.   Testing/Procedures: None today   Follow-Up: At [facility], you and your health needs are our priority.  As part of our continuing mission to provide you with exceptional heart care, we have created designated Provider Care Teams.  These Care Teams include your primary Cardiologist (physician) and Advanced Practice Providers (APPs -  Physician Assistants and Nurse Practitioners) who all work together to provide you with the care you need, when you need it.  We recommend signing up for the patient portal called "MyChart".  Sign up information is provided on this After Visit Summary.  MyChart is used to connect with patients for Virtual Visits (Telemedicine).  Patients are able to view lab/test results, encounter notes, upcoming appointments, etc.  Non-urgent messages can be sent to your provider as well.   To learn more about what you can do with MyChart, go to [email or web address].    Your next appointment:   12 month(s)  The format for your next appointment:   In Person  Provider:   Suresh Koneswaran, MD   Other Instructions None      Thank you for choosing [facility] !         

## 2019-06-25 ENCOUNTER — Other Ambulatory Visit: Payer: Self-pay

## 2019-06-25 ENCOUNTER — Other Ambulatory Visit: Payer: Self-pay | Admitting: Cardiovascular Disease

## 2019-06-25 MED ORDER — LISINOPRIL 40 MG PO TABS: ORAL_TABLET | ORAL | 1 refills | Status: DC

## 2019-06-25 NOTE — Telephone Encounter (Signed)
Per [employer]. Please send lisinopril to [facility].   Thanks [employer].   Thanks renee

## 2019-06-25 NOTE — Telephone Encounter (Signed)
done

## 2019-07-08 ENCOUNTER — Ambulatory Visit (INDEPENDENT_AMBULATORY_CARE_PROVIDER_SITE_OTHER): Payer: Medicare Other | Admitting: *Deleted

## 2019-07-08 ENCOUNTER — Other Ambulatory Visit: Payer: Self-pay

## 2019-07-08 DIAGNOSIS — Z5181 Encounter for therapeutic drug level monitoring: Secondary | ICD-10-CM

## 2019-07-08 DIAGNOSIS — I480 Paroxysmal atrial fibrillation: Secondary | ICD-10-CM | POA: Diagnosis not present

## 2019-07-08 LAB — POCT INR: INR: 2.4 (ref 2.0–3.0)

## 2019-07-08 NOTE — Patient Instructions (Signed)
Continue warfarin 1 tablet daily except 1 1/2 tablets Tuesdays, Thursdays and Saturdays.   Recheck in 4 weeks.  

## 2019-07-30 ENCOUNTER — Other Ambulatory Visit: Payer: Self-pay

## 2019-07-30 ENCOUNTER — Ambulatory Visit: Payer: Medicare Other | Admitting: Pulmonary Disease

## 2019-07-30 ENCOUNTER — Encounter: Payer: Self-pay | Admitting: Pulmonary Disease

## 2019-07-30 DIAGNOSIS — I480 Paroxysmal atrial fibrillation: Secondary | ICD-10-CM | POA: Diagnosis not present

## 2019-07-30 DIAGNOSIS — G4733 Obstructive sleep apnea (adult) (pediatric): Secondary | ICD-10-CM

## 2019-07-30 NOTE — Assessment & Plan Note (Signed)
Rate controlled on verapamil Blood pressure is well controlled

## 2019-07-30 NOTE — Assessment & Plan Note (Signed)
CPAP seems to be working well on current settings of 11 cm. He has no problems with mask or pressure and CPAP is certainly helped improve his daytime somnolence and fatigue. CPAP is 73 years old and he would be eligible for a new machine should he have any problems  We will try to obtain download from his ST card to objectively review  Weight loss encouraged, compliance with goal of at least 4-6 hrs every night is the expectation. Advised against medications with sedative side effects Cautioned against driving when sleepy - understanding that sleepiness will vary on a day to day basis

## 2019-07-30 NOTE — Patient Instructions (Signed)
CPAP seems to be working well You will be eligible for a new machine if any problems

## 2019-07-30 NOTE — Progress Notes (Signed)
   Subjective:    Patient ID: Riley [name], male    DOB: 02/21/1946, 74 y.o.   MRN: [id number]  HPI  74 yo man with AFib followed for OSA  -night shift worker x 30y  Chief Complaint  Patient presents with  . Follow-up    Patient feels good overall. Not having any issues with CPAP. States that it's helping and he is sleeping better.    Annual follow-up He has done well with CPAP machine and this is helped improve his daytime somnolence and fatigue.  Still has some problems with sleep maintenance but has settled down with full facemask, no problems with mask or pressure His machine is old and he brings in a card today  Received covid vaccines x2  Significant tests/ events reviewed  2014 PSG >>TST 128 mins - only 2-3 hrs of sleep -RDI 37/h Started on autoCPAP 5-15>> changed to 11 cm  Review of Systems Patient denies significant dyspnea,cough, hemoptysis,  chest pain, palpitations, pedal edema, orthopnea, paroxysmal nocturnal dyspnea, lightheadedness, nausea, vomiting, abdominal or  leg pains      Objective:   Physical Exam  Gen. Pleasant, obese, in no distress ENT - no lesions, no post nasal drip Neck: No JVD, no thyromegaly, no carotid bruits Lungs: no use of accessory muscles, no dullness to percussion, decreased without rales or rhonchi  Cardiovascular: Rhythm regular, heart sounds  normal, no murmurs or gallops, no peripheral edema Musculoskeletal: No deformities, no cyanosis or clubbing , no tremors       Assessment & Plan:

## 2019-08-04 ENCOUNTER — Other Ambulatory Visit: Payer: Self-pay | Admitting: Cardiovascular Disease

## 2019-08-07 ENCOUNTER — Ambulatory Visit (INDEPENDENT_AMBULATORY_CARE_PROVIDER_SITE_OTHER): Payer: Medicare Other | Admitting: *Deleted

## 2019-08-07 ENCOUNTER — Other Ambulatory Visit: Payer: Self-pay

## 2019-08-07 DIAGNOSIS — I480 Paroxysmal atrial fibrillation: Secondary | ICD-10-CM

## 2019-08-07 DIAGNOSIS — Z5181 Encounter for therapeutic drug level monitoring: Secondary | ICD-10-CM

## 2019-08-07 LAB — POCT INR: INR: 2.6 (ref 2.0–3.0)

## 2019-08-07 NOTE — Patient Instructions (Signed)
Continue warfarin 1 tablet daily except 1 1/2 tablets Tuesdays, Thursdays and Saturdays.   Recheck in 5 weeks.

## 2019-08-20 ENCOUNTER — Telehealth: Payer: Self-pay | Admitting: Pulmonary Disease

## 2019-08-20 NOTE — Telephone Encounter (Signed)
Pt's wife called back, please return call anytime this afternoon

## 2019-08-20 NOTE — Telephone Encounter (Signed)
CPAP download reviewed >> no residual events on 11 cm, good compliance

## 2019-08-20 NOTE — Telephone Encounter (Signed)
Spoke with patient's wife Hilda Blades. She verbalized understanding.   Nothing further needed at time of call.

## 2019-08-20 NOTE — Telephone Encounter (Signed)
ATC pt, no answer. Left message for pt to call back.  

## 2019-09-10 ENCOUNTER — Ambulatory Visit: Payer: Medicare Other | Admitting: Nurse Practitioner

## 2019-09-10 ENCOUNTER — Encounter: Payer: Self-pay | Admitting: Nurse Practitioner

## 2019-09-10 ENCOUNTER — Other Ambulatory Visit: Payer: Self-pay

## 2019-09-10 VITALS — BP 160/82 | HR 56 | Temp 97.1°F | Ht 72.0 in | Wt 224.2 lb

## 2019-09-10 DIAGNOSIS — K219 Gastro-esophageal reflux disease without esophagitis: Secondary | ICD-10-CM | POA: Diagnosis not present

## 2019-09-10 DIAGNOSIS — K5900 Constipation, unspecified: Secondary | ICD-10-CM

## 2019-09-10 DIAGNOSIS — R1030 Lower abdominal pain, unspecified: Secondary | ICD-10-CM

## 2019-09-10 NOTE — Assessment & Plan Note (Signed)
Constipation doing much better on Linzess 290 mcg.  This past week he has had even better response since increasing his fiber intake with summer squash.  Recommend he continue to ensure adequate water, continue to enjoy fibers foods liberally.  Continue current medications.  Let us know for any worsening or recurrent symptoms, otherwise follow-up in a year.

## 2019-09-10 NOTE — Patient Instructions (Addendum)
Your health issues we discussed today were:   GERD (reflux/heartburn): 1. I am glad you are doing well! 2. Below I am putting a list of foods that are typical triggers for most people to help you start learning what to avoid 3. You can use Tums as needed for breakthrough reflux, as you have been 4. Call us if you have any worsening or severe symptoms  Constipation: 1. I am glad you are doing better! 2. Continue taking Linzess 290 mcg daily as you have been 3. Continue to enjoy a more fiber, Ensure you are drinking adequate water 4. Call us for any worsening or severe symptoms  Abdominal pain: 1. I am glad your abdominal pain is doing better excavation point 2. Continue to monitor and let us know if you are having any worsening abdominal pain  Overall I recommend:  1. Continue your other current medications 2. Return for follow-up in 1 year 3. Call us if you have any questions or concerns   ---------------------------------------------------------------  I am glad you have gotten your COVID-19 vaccination!  Even though you are fully vaccinated you should continue to follow CDC and state/local guidelines.  ---------------------------------------------------------------   At [facility] we value your feedback. You may receive a survey about your visit today. Please share your experience as we strive to create trusting relationships with our patients to provide genuine, compassionate, quality care.  We appreciate your understanding and patience as we review any laboratory studies, imaging, and other diagnostic tests that are ordered as we care for you. Our office policy is 5 business days for review of these results, and any emergent or urgent results are addressed in a timely manner for your best interest. If you do not hear from our office in 1 week, please contact us.   We also encourage the use of MyChart, which contains your medical information for your review as  well. If you are not enrolled in this feature, an access code is on this after visit summary for your convenience. Thank you for allowing [place] to be involved in your care.  It was great to see you today!  I hope you have a great Summer!!      Food Choices for Gastroesophageal Reflux Disease, Adult When you have gastroesophageal reflux disease (GERD), the foods you eat and your eating habits are very important. Choosing the right foods can help ease the discomfort of GERD. Consider working with a diet and nutrition specialist (dietitian) to help you make healthy food choices. What general guidelines should I follow?  Eating plan  Choose healthy foods low in fat, such as fruits, vegetables, whole grains, low-fat dairy products, and lean meat, fish, and poultry.  Eat frequent, small meals instead of three large meals each day. Eat your meals slowly, in a relaxed setting. Avoid bending over or lying down until 2-3 hours after eating.  Limit high-fat foods such as fatty meats or fried foods.  Limit your intake of oils, butter, and shortening to less than 8 teaspoons each day.  Avoid the following: ? Foods that cause symptoms. These may be different for different people. Keep a food diary to keep track of foods that cause symptoms. ? Alcohol. ? Drinking large amounts of liquid with meals. ? Eating meals during the 2-3 hours before bed.  Cook foods using methods other than frying. This may include baking, grilling, or broiling. Lifestyle  Maintain a healthy weight. Ask your health care provider what weight is healthy for you. If  you need to lose weight, work with your health care provider to do so safely.  Exercise for at least 30 minutes on 5 or more days each week, or as told by your health care provider.  Avoid wearing clothes that fit tightly around your waist and chest.  Do not use any products that contain nicotine or tobacco, such as cigarettes and e-cigarettes. If you need help  quitting, ask your health care provider.  Sleep with the head of your bed raised. Use a wedge under the mattress or blocks under the bed frame to raise the head of the bed. What foods are not recommended? The items listed may not be a complete list. Talk with your dietitian about what dietary choices are best for you. Grains Pastries or quick breads with added fat. [place] toast. Vegetables Deep fried vegetables. [place] fries. Any vegetables prepared with added fat. Any vegetables that cause symptoms. For some people this may include tomatoes and tomato products, chili peppers, onions and garlic, and horseradish. Fruits Any fruits prepared with added fat. Any fruits that cause symptoms. For some people this may include citrus fruits, such as oranges, grapefruit, pineapple, and lemons. Meats and other protein foods High-fat meats, such as fatty beef or pork, hot dogs, ribs, ham, sausage, salami and bacon. Fried meat or protein, including fried fish and fried chicken. Nuts and nut butters. Dairy Whole milk and chocolate milk. Sour cream. Cream. Ice cream. Cream cheese. Milk shakes. Beverages Coffee and tea, with or without caffeine. Carbonated beverages. Sodas. Energy drinks. Fruit juice made with acidic fruits (such as orange or grapefruit). Tomato juice. Alcoholic drinks. Fats and oils Butter. Margarine. Shortening. Ghee. Sweets and desserts Chocolate and cocoa. Donuts. Seasoning and other foods Pepper. Peppermint and spearmint. Any condiments, herbs, or seasonings that cause symptoms. For some people, this may include curry, hot sauce, or vinegar-based salad dressings. Summary  When you have gastroesophageal reflux disease (GERD), food and lifestyle choices are very important to help ease the discomfort of GERD.  Eat frequent, small meals instead of three large meals each day. Eat your meals slowly, in a relaxed setting. Avoid bending over or lying down until 2-3 hours after  eating.  Limit high-fat foods such as fatty meat or fried foods. This information is not intended to replace advice given to you by your health care provider. Make sure you discuss any questions you have with your health care provider. Document Revised: 06/13/2018 Document Reviewed: 02/22/2016 Elsevier Patient Education  [place].

## 2019-09-10 NOTE — Progress Notes (Signed)
Referring Provider: Redmond School, MD Primary Care Physician:  Redmond School, MD Primary GI:  Dr. Gala Romney  Chief Complaint  Patient presents with  . Gastroesophageal Reflux    will occas have to take tums d/t indigestion  . Constipation    f/u. Doing okay now    HPI:   Riley [name] is a 74 y.o. male who presents for follow-up on constipation and GERD.  The patient was last seen in our office 03/13/2019 for the same as well as lower abdominal pain.  His last visit was a virtual office visit due to COVID-19/coronavirus pandemic.  Noted colonoscopy up-to-date December 2019 with recommended no future colonoscopy.  Previously trialed MiraLAX and fiber as well as Linzess 72 mcg and 145 mcg.  Still with some persistent symptoms so this is increased to 290 mcg.  GERD historically well managed even after stopping his H2 receptor blocker.  At his last visit he noted minimal constipation with the Linzess 290 mcg dose, rare discomfort.  Not drinking as much water she thinks is the culprit.  Bloating resolved after stopping fiber and MiraLAX.  GERD doing well even after stopping medications, rare Tums as needed.  No other overt GI complaints.  Recommended continue Linzess 290 mcg daily, increase water intake, monitor for recurrent GERD symptoms, follow-up in 6 months.  Today he states he's doing ok overall. Occasional GERD flares, dependent on dietary indiscretion, and uses OTC TUMS which helps typically. Typical offending foods are spicy and greasy. Is trying to avoid triggers more; try to bake or broil. Constipation doing well on Linzess. Having daily bowel movements which pass easily. Denies abdominal pain, N/V, hematochezia, melena, fevers, chills, unintentional weight loss. Denies URI or flu-like symptoms. Denies loss of sense of taste or smell. The patient has received COVID-19 vaccination(s). Denies chest pain, dyspnea, dizziness, lightheadedness, syncope, near syncope. Denies any other upper or  lower GI symptoms.  Past Medical History:  Diagnosis Date  . Chronic anticoagulation   . Chronic back pain   . Diabetes mellitus, type 2 (HCC)    no insulin  . Edema   . GERD (gastroesophageal reflux disease)   . Hyperlipidemia   . Hypertension    normal coronary angiography and normal EF in 12/98  . 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 Paroxysmal atrial fibrillation ([place])    12/2005; [name]     Past Surgical History:  Procedure Laterality Date  . APPENDECTOMY  1998  . COLONOSCOPY N/A 11/26/2012   YWV:PXTGG 2 hemorrhoids. Pancolonic diverticulosis. Colonic polyps removed as described above. One hemostasis clip applied. TUBULAR ADENOMA. Surveillance Sept 2019.   Marland Kitchen COLONOSCOPY N/A 02/06/2018   Procedure: COLONOSCOPY;  Surgeon: Daneil Dolin, MD;  [facility];  Service: Endoscopy;  Laterality: N/A;  12:00pm  . INSERTION OF MESH N/A 01/27/2013   Procedure: INSERTION OF MESH;  Surgeon: Jamesetta So, MD;  Location: [facility];  Service: General;  Laterality: N/A;  . POLYPECTOMY  02/06/2018   Procedure: POLYPECTOMY;  Surgeon: Daneil Dolin, MD;  [facility];  Service: Endoscopy;;  (colon)  . TONSILLECTOMY     as a child  . UMBILICAL HERNIA REPAIR N/A 01/27/2013   Procedure: HERNIA REPAIR UMBILICAL ADULT;  Surgeon: Jamesetta So, MD;  Location: [facility];  Service: General;  Laterality: N/A;    Current Outpatient Medications  Medication Sig Dispense Refill  .  acetaminophen (TYLENOL) 500 MG tablet Take 500 mg by mouth every 6 (six) hours as needed (for pain.).    Marland Kitchen calcium carbonate (TUMS - DOSED IN MG ELEMENTAL CALCIUM) 500 MG chewable tablet Chew 1 tablet by mouth as needed for indigestion or heartburn.    . chlorthalidone (HYGROTON) 25 MG tablet TAKE (1) TABLET BY MOUTH ONCE DAILY. (Patient taking differently: every other day. TAKE (1) TABLET BY MOUTH ONCE DAILY.) 90 tablet 3  . diltiazem  (CARDIZEM) 30 MG tablet Take 1 tablet (30 mg total) by mouth daily as needed (Palpations). 30 tablet 11  . DM-APAP-CPM (CORICIDIN HBP PO) Take 1 tablet by mouth daily as needed (for cold/sinus issues.).     Marland Kitchen doxazosin (CARDURA) 4 MG tablet TAKE (3) TABLETS BY MOUTH ONCE DAILY. 270 tablet 3  . glimepiride (AMARYL) 1 MG tablet Take 1 tablet by mouth daily.    . Hydrocortisone (PREPARATION H EX) Place 1 application rectally daily as needed (irritation/discomfort).     Marland Kitchen linaclotide (LINZESS) 290 MCG CAPS capsule Take 1 capsule (290 mcg total) by mouth daily before breakfast. 30 capsule 5  . lisinopril (ZESTRIL) 40 MG tablet TAKE ONE TABLET BY MOUTH DAILY. Please call TO schedule an appointment FOR further refills. 90 tablet 1  . Magnesium Oxide 200 MG TABS Take 1 tablet (200 mg total) by mouth daily. 30 each 11  . metFORMIN (GLUCOPHAGE) 1000 MG tablet Take 1 tablet by mouth 2 (two) times daily.    . Multiple Vitamin (MULTIVITAMIN) tablet Take 1 tablet by mouth daily.    . pravastatin (PRAVACHOL) 20 MG tablet Take 1 tablet (20 mg total) by mouth every evening. 90 tablet 3  . Triamcinolone Acetonide (NASACORT ALLERGY 24HR NA) Place 1 spray into the nose daily as needed (congestion).    . verapamil (CALAN-SR) 180 MG CR tablet TAKE (2) TABLETS BY MOUTH AT BEDTIME. 180 tablet 3  . warfarin (COUMADIN) 5 MG tablet Take 1 tablet daily except 1 1/2 tablets on Tuesdays, Thursdays and Saturdays or as directed 40 tablet 6   No current facility-administered medications for this visit.    Allergies as of 09/10/2019  . (No Known Allergies)    Family History  Problem Relation Age of Onset  . Heart disease Father   . Heart disease Mother   . Bronchitis Mother   . Allergies Other        whole family  . Colon cancer Neg Hx     Social History   Socioeconomic History  . Marital status: Married    Spouse name: Not on file  . Number of children: Not on file  . Years of education: Not on file  . Highest  education level: Not on file  Occupational History  . Occupation: Retired    Fish farm manager: [employer]    Comment: Company secretary  Tobacco Use  . Smoking status: Never Smoker  . Smokeless tobacco: Never Used  Vaping Use  . Vaping Use: Never used  Substance and Sexual Activity  . Alcohol use: No    Alcohol/week: 0.0 standard drinks  . Drug use: No  . Sexual activity: Not on file  Other Topics Concern  . Not on file  Social History Narrative  . Not on file   Social Determinants of Health   Financial Resource Strain:   . Difficulty of Paying Living Expenses:   Food Insecurity:   . Worried About Charity fundraiser in the Last Year:   . [employer] of  Food in the Last Year:   Transportation Needs:   . Film/video editor (Medical):   Marland Kitchen Lack of Transportation (Non-Medical):   Physical Activity:   . Days of Exercise per Week:   . Minutes of Exercise per Session:   Stress:   . Feeling of Stress :   Social Connections:   . Frequency of Communication with Friends and Family:   . Frequency of Social Gatherings with Friends and Family:   . Attends Religious Services:   . Active Member of Clubs or Organizations:   . Attends Archivist Meetings:   Marland Kitchen Marital Status:     Subjective: Review of Systems  Constitutional: Negative for chills, fever, malaise/fatigue and weight loss.  HENT: Negative for congestion and sore throat.   Respiratory: Negative for cough and shortness of breath.   Cardiovascular: Negative for chest pain and palpitations.  Gastrointestinal: Negative for abdominal pain, blood in stool, diarrhea, melena, nausea and vomiting.  Musculoskeletal: Negative for joint pain and myalgias.  Skin: Negative for rash.  Neurological: Negative for dizziness and weakness.  Endo/Heme/Allergies: Does not bruise/bleed easily.  Psychiatric/Behavioral: Negative for depression. The patient is not nervous/anxious.   All other systems reviewed and are  negative.    Objective: BP (!) 160/82   Pulse (!) 56   Temp (!) 97.1 F (36.2 C) (Oral)   Ht 6' (1.829 m)   Wt 224 lb 3.2 oz (101.7 kg)   BMI 30.41 kg/m  Physical Exam Vitals and nursing note reviewed.  Constitutional:      General: He is not in acute distress.    Appearance: Normal appearance. He is not ill-appearing, toxic-appearing or diaphoretic.  HENT:     Head: Normocephalic and atraumatic.     Nose: No congestion or rhinorrhea.  Eyes:     General: No scleral icterus. Cardiovascular:     Rate and Rhythm: Normal rate and regular rhythm.     Heart sounds: Normal heart sounds.  Pulmonary:     Effort: Pulmonary effort is normal.     Breath sounds: Normal breath sounds.  Abdominal:     General: Bowel sounds are normal. There is no distension.     Palpations: Abdomen is soft. There is no hepatomegaly, splenomegaly or mass.     Tenderness: There is no abdominal tenderness. There is no guarding or rebound.     Hernia: No hernia is present.  Musculoskeletal:     Cervical back: Neck supple.  Skin:    General: Skin is warm and dry.     Coloration: Skin is not jaundiced.     Findings: No bruising or rash.  Neurological:     General: No focal deficit present.     Mental Status: He is alert and oriented to person, place, and time. Mental status is at baseline.  Psychiatric:        Mood and Affect: Mood normal.        Behavior: Behavior normal.        Thought Content: Thought content normal.       09/10/2019 10:58 AM   Disclaimer: This note was dictated with voice recognition software. Similar sounding words can inadvertently be transcribed and may not be corrected upon review.

## 2019-09-10 NOTE — Assessment & Plan Note (Signed)
Continues to do well off medication.  Recommend continue to avoid triggers as these are typically what causes breakthrough symptoms.  Can continue to use Tums as needed.  Call for any worsening symptoms otherwise follow-up in 1 year.

## 2019-09-10 NOTE — Assessment & Plan Note (Signed)
Abdominal pain has resolved since GERD and constipation have been better managed.  Continue to monitor and notify us of any worsening symptoms.  Otherwise follow-up in 1 year.

## 2019-09-11 ENCOUNTER — Ambulatory Visit (INDEPENDENT_AMBULATORY_CARE_PROVIDER_SITE_OTHER): Payer: Medicare Other | Admitting: *Deleted

## 2019-09-11 DIAGNOSIS — I480 Paroxysmal atrial fibrillation: Secondary | ICD-10-CM

## 2019-09-11 DIAGNOSIS — Z5181 Encounter for therapeutic drug level monitoring: Secondary | ICD-10-CM | POA: Diagnosis not present

## 2019-09-11 LAB — POCT INR: INR: 2.6 (ref 2.0–3.0)

## 2019-09-11 NOTE — Patient Instructions (Signed)
Continue warfarin 1 tablet daily except 1 1/2 tablets Tuesdays, Thursdays and Saturdays.   Recheck in 6 weeks.  

## 2019-10-22 ENCOUNTER — Telehealth: Payer: Self-pay | Admitting: Internal Medicine

## 2019-10-22 ENCOUNTER — Other Ambulatory Visit: Payer: Self-pay | Admitting: Student

## 2019-10-22 ENCOUNTER — Other Ambulatory Visit: Payer: Self-pay | Admitting: Gastroenterology

## 2019-10-22 DIAGNOSIS — K5900 Constipation, unspecified: Secondary | ICD-10-CM

## 2019-10-22 MED ORDER — LINACLOTIDE 290 MCG PO CAPS: 290.0000 ug | ORAL_CAPSULE | Freq: Every day | ORAL | 11 refills | Status: DC

## 2019-10-22 MED ORDER — LISINOPRIL 40 MG PO TABS: ORAL_TABLET | ORAL | 1 refills | Status: DC

## 2019-10-22 NOTE — Telephone Encounter (Signed)
Refill needed for Linzess 290 mcg one pill 30 mins before breakfast.

## 2019-10-22 NOTE — Telephone Encounter (Signed)
PATIENT NEEDS REFILL OF LINZESS SENT TO HIS PHARMACY

## 2019-10-22 NOTE — Telephone Encounter (Signed)
refilled 

## 2019-10-22 NOTE — Telephone Encounter (Signed)
Rx sent to pharmacy   

## 2019-10-22 NOTE — Telephone Encounter (Signed)
Please refill lisinopril 40mg     If needed call Caren Griffins [phone]   Thanks renee

## 2019-10-23 ENCOUNTER — Ambulatory Visit (INDEPENDENT_AMBULATORY_CARE_PROVIDER_SITE_OTHER): Payer: Medicare Other | Admitting: *Deleted

## 2019-10-23 DIAGNOSIS — Z5181 Encounter for therapeutic drug level monitoring: Secondary | ICD-10-CM | POA: Diagnosis not present

## 2019-10-23 DIAGNOSIS — I480 Paroxysmal atrial fibrillation: Secondary | ICD-10-CM | POA: Diagnosis not present

## 2019-10-23 LAB — POCT INR: INR: 2.7 (ref 2.0–3.0)

## 2019-10-23 NOTE — Patient Instructions (Signed)
Continue warfarin 1 tablet daily except 1 1/2 tablets Tuesdays, Thursdays and Saturdays.   Recheck in 6 weeks.  

## 2019-10-23 NOTE — Telephone Encounter (Signed)
Noted  

## 2019-11-21 ENCOUNTER — Ambulatory Visit
Admission: EM | Admit: 2019-11-21 | Discharge: 2019-11-21 | Disposition: A | Discharge status: Home / Self Care | Payer: Medicare Other | Attending: Emergency Medicine | Admitting: Emergency Medicine

## 2019-11-21 ENCOUNTER — Ambulatory Visit (INDEPENDENT_AMBULATORY_CARE_PROVIDER_SITE_OTHER): Payer: Medicare Other

## 2019-11-21 DIAGNOSIS — R05 Cough: Secondary | ICD-10-CM

## 2019-11-21 DIAGNOSIS — R0602 Shortness of breath: Secondary | ICD-10-CM

## 2019-11-21 DIAGNOSIS — Z20822 Contact with and (suspected) exposure to covid-19: Secondary | ICD-10-CM

## 2019-11-21 DIAGNOSIS — R079 Chest pain, unspecified: Secondary | ICD-10-CM

## 2019-11-21 DIAGNOSIS — R059 Cough, unspecified: Secondary | ICD-10-CM

## 2019-11-21 MED ORDER — ALBUTEROL SULFATE HFA 108 (90 BASE) MCG/ACT IN AERS
1.0000 | INHALATION_SPRAY | Freq: Four times a day (QID) | RESPIRATORY_TRACT | 1 refills | Status: DC | PRN
Start: 2019-11-21 — End: 2021-01-18

## 2019-11-21 MED ORDER — PREDNISONE 10 MG (21) PO TBPK: ORAL_TABLET | Freq: Every day | ORAL | 0 refills | Status: DC

## 2019-11-21 NOTE — ED Triage Notes (Signed)
Pt presents with multiple complaints, states he has had nasal congestion, back, chest and leg pain . Began about a week ago, pain better with movement

## 2019-11-21 NOTE — Discharge Instructions (Addendum)
Your covid results will show on you my chart in 3-5 days  You will need to quarantine for 10 days we have cause to think you have covid.  Stay hydrated  If you have increase shortness of breast increase chest pain you will need to go to the ER  Take full deep breaths  Can cont to take otc tylenol as needed

## 2019-11-21 NOTE — ED Provider Notes (Signed)
[facility]    CSN: [id number] Arrival date & time: 11/21/19  1126      History   Chief Complaint Chief Complaint  Patient presents with  . Chest Pain  . Nasal Congestion    HPI Riley [name] is a 74 y.o. male.   Pt here for chest pain, sob, back pain, arm pain. While talking to him seems sob, states that he has felt like his heart has been racing just not feeling well. His daughter, son, grand son currently has covid. Tas been taking otc meds with minimal relief.      Past Medical History:  Diagnosis Date  . Chronic anticoagulation   . Chronic back pain   . Diabetes mellitus, type 2 (HCC)    no insulin  . Edema   . GERD (gastroesophageal reflux disease)   . Hyperlipidemia   . Hypertension    normal coronary angiography and normal EF in 12/98  . 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 Paroxysmal atrial fibrillation ([place])    12/2005; [name]     Patient Active Problem List   Diagnosis Date Noted  . Abdominal pain 10/09/2018  . GERD (gastroesophageal reflux disease) 12/20/2017  . H/O adenomatous polyp of colon 12/20/2017  . Morbid obesity ([place]) 02/11/2015  . Encounter for therapeutic drug monitoring 04/10/2013  . Constipation 11/20/2012  . Hemorrhoids 11/20/2012  . OSA (obstructive sleep apnea) 06/26/2012  . Seasonal allergies 04/29/2012  . Paroxysmal atrial fibrillation (HCC)   . Chronic anticoagulation   . Hypertension   . Diabetes mellitus type II, controlled ([place])   . Hyperlipidemia   . Pedal edema 06/08/2009    Past Surgical History:  Procedure Laterality Date  . APPENDECTOMY  1998  . COLONOSCOPY N/A 11/26/2012   GUR:KYHCW 2 hemorrhoids. Pancolonic diverticulosis. Colonic polyps removed as described above. One hemostasis clip applied. TUBULAR ADENOMA. Surveillance Sept 2019.   Marland Kitchen COLONOSCOPY N/A 02/06/2018   Procedure: COLONOSCOPY;  Surgeon: Daneil Dolin, MD;  [facility];  Service: Endoscopy;  Laterality: N/A;  12:00pm  . INSERTION OF MESH N/A 01/27/2013   Procedure: INSERTION OF MESH;  Surgeon: Jamesetta So, MD;  Location: [facility];  Service: General;  Laterality: N/A;  . POLYPECTOMY  02/06/2018   Procedure: POLYPECTOMY;  Surgeon: Daneil Dolin, MD;  [facility];  Service: Endoscopy;;  (colon)  . TONSILLECTOMY     as a child  . UMBILICAL HERNIA REPAIR N/A 01/27/2013   Procedure: HERNIA REPAIR UMBILICAL ADULT;  Surgeon: Jamesetta So, MD;  Location: [facility];  Service: General;  Laterality: N/A;       Home Medications    Prior to Admission medications   Medication Sig Start Date End Date Taking? Authorizing Provider  acetaminophen (TYLENOL) 500 MG tablet Take 500 mg by mouth every 6 (six) hours as needed (for pain.).    [provider]  albuterol (VENTOLIN HFA) 108 (90 Base) MCG/ACT inhaler Inhale 1-2 puffs into the lungs every 6 (six) hours as needed for wheezing or shortness of breath. 11/21/19   Marney Setting, NP  calcium carbonate (TUMS - DOSED IN MG ELEMENTAL CALCIUM) 500 MG chewable tablet Chew 1 tablet by mouth as needed for indigestion or heartburn.    [provider]  chlorthalidone (HYGROTON) 25 MG tablet TAKE (1) TABLET BY MOUTH ONCE DAILY. Patient taking differently: every other day. TAKE (1)  TABLET BY MOUTH ONCE DAILY. 09/25/18   Herminio Commons, MD  diltiazem (CARDIZEM) 30 MG tablet Take 1 tablet (30 mg total) by mouth daily as needed (Palpations). 01/01/18   Herminio Commons, MD  DM-APAP-CPM (CORICIDIN HBP PO) Take 1 tablet by mouth daily as needed (for cold/sinus issues.).     [provider]  doxazosin (CARDURA) 4 MG tablet TAKE (3) TABLETS BY MOUTH ONCE DAILY. 06/11/19   Herminio Commons, MD  glimepiride (AMARYL) 1 MG tablet Take 1 tablet by mouth daily. 12/09/18   [provider]  Hydrocortisone (PREPARATION H EX) Place 1 application rectally daily as needed  (irritation/discomfort).     [provider]  linaclotide Rolan Lipa) 290 MCG CAPS capsule Take 1 capsule (290 mcg total) by mouth daily before breakfast. 10/22/19   Erenest Rasher, PA-C  lisinopril (ZESTRIL) 40 MG tablet TAKE ONE TABLET BY MOUTH DAILY. Please call TO schedule an appointment FOR further refills. 10/22/19   Strader, Fransisco Hertz, PA-C  Magnesium Oxide 200 MG TABS Take 1 tablet (200 mg total) by mouth daily. 11/02/16   Lendon Colonel, NP  metFORMIN (GLUCOPHAGE) 1000 MG tablet Take 1 tablet by mouth 2 (two) times daily. 12/09/18   [provider]  Multiple Vitamin (MULTIVITAMIN) tablet Take 1 tablet by mouth daily.    [provider]  pravastatin (PRAVACHOL) 20 MG tablet Take 1 tablet (20 mg total) by mouth every evening. 10/01/18   Herminio Commons, MD  predniSONE (STERAPRED UNI-PAK 21 TAB) 10 MG (21) TBPK tablet Take by mouth daily. Take 6 tabs by mouth daily  for 2 days, then 5 tabs for 2 days, then 4 tabs for 2 days, then 3 tabs for 2 days, 2 tabs for 2 days, then 1 tab by mouth daily for 2 days 11/21/19   Marney Setting, NP  Triamcinolone Acetonide (NASACORT ALLERGY 24HR NA) Place 1 spray into the nose daily as needed (congestion).    [provider]  verapamil (CALAN-SR) 180 MG CR tablet TAKE (2) TABLETS BY MOUTH AT BEDTIME. 06/11/19   Herminio Commons, MD  warfarin (COUMADIN) 5 MG tablet Take 1 tablet daily except 1 1/2 tablets on Tuesdays, Thursdays and Saturdays or as directed 08/05/19   Herminio Commons, MD    Family History Family History  Problem Relation Age of Onset  . Heart disease Father   . Heart disease Mother   . Bronchitis Mother   . Allergies Other        whole family  . Colon cancer Neg Hx     Social History Social History   Tobacco Use  . Smoking status: Never Smoker  . Smokeless tobacco: Never Used  Vaping Use  . Vaping Use: Never used  Substance Use Topics  . Alcohol use: No    Alcohol/week: 0.0  standard drinks  . Drug use: No     Allergies   Patient has no known allergies.   Review of Systems Review of Systems  HENT: Positive for congestion, postnasal drip, rhinorrhea, sinus pressure, sinus pain, sneezing and sore throat.   Eyes:       Irritated   Respiratory: Positive for cough and shortness of breath.   Cardiovascular: Positive for chest pain.  Gastrointestinal: Negative.   Musculoskeletal:       Generalized pain all over   Neurological: Positive for headaches.     Physical Exam Triage Vital Signs ED Triage Vitals  Enc Vitals Group  BP 11/21/19 1138 (!) 149/79     Pulse Rate 11/21/19 1138 68     Resp 11/21/19 1138 20     Temp 11/21/19 1142 98 F (36.7 C)     Temp src --      SpO2 11/21/19 1138 95 %     Weight --      Height --      Head Circumference --      Peak Flow --      Pain Score 11/21/19 1140 5     Pain Loc --      Pain Edu? --      Excl. in [place]? --    No data found.  Updated Vital Signs BP (!) 149/70   Pulse 68   Temp 98 F (36.7 C)   Resp 18   SpO2 95%   Visual Acuity       Physical Exam Constitutional:      Appearance: He is obese. He is ill-appearing.  HENT:     Head: Normocephalic.  Eyes:     Comments: Erythema to bil eyes, water glaze over them denies any vision problems.   Cardiovascular:     Rate and Rhythm: Rhythm irregular.  Pulmonary:     Breath sounds: Normal breath sounds.     Comments: Seems sob while talking  Chest:     Comments: Chest pain to bil chest above rib area. And upper back  Abdominal:     Palpations: Abdomen is soft.  Skin:    Capillary Refill: Capillary refill takes less than 2 seconds.  Neurological:     General: No focal deficit present.     Mental Status: He is alert.      UC Treatments / Results  Labs (all labs ordered are listed, but only abnormal results are displayed) Labs Reviewed  NOVEL CORONAVIRUS, NAA    EKG   Radiology DG Chest 2 View  Result Date:  11/21/2019 CLINICAL DATA:  Chest pain short of breath EXAM: CHEST - 2 VIEW COMPARISON:  05/15/2012 FINDINGS: Heart size upper normal. Vascularity normal. Negative for heart failure infiltrate or effusion. No acute skeletal abnormality. IMPRESSION: No active cardiopulmonary disease. Electronically Signed   By: Franchot Gallo M.D.   On: 11/21/2019 11:59    Procedures Procedures (including critical care time)  Medications Ordered in UC Medications - No data to display  Initial Impression / Assessment and Plan / UC Course  I have reviewed the triage vital signs and the nursing notes.  Pertinent labs & imaging results that were available during my care of the patient were reviewed by me and considered in my medical decision making (see chart for details).    Reviewed previous ekg and hx  Discussed the symptoms to look for to go to the ER  Avoid contact Take deep breaths  [place] e inhaler as needed   Final Clinical Impressions(s) / UC Diagnoses   Final diagnoses:  COVID-19 ruled out  Cough  Chest pain, unspecified type     Discharge Instructions     Your covid results will show on you my chart in 3-5 days  You will need to quarantine for 10 days we have cause to think you have covid.  Stay hydrated  If you have increase shortness of breast increase chest pain you will need to go to the ER  Take full deep breaths  Can cont to take otc tylenol as needed     ED Prescriptions    Medication  Sig Dispense Auth. Provider   albuterol (VENTOLIN HFA) 108 (90 Base) MCG/ACT inhaler Inhale 1-2 puffs into the lungs every 6 (six) hours as needed for wheezing or shortness of breath. 18 g Morley Kos L, NP   predniSONE (STERAPRED UNI-PAK 21 TAB) 10 MG (21) TBPK tablet Take by mouth daily. Take 6 tabs by mouth daily  for 2 days, then 5 tabs for 2 days, then 4 tabs for 2 days, then 3 tabs for 2 days, 2 tabs for 2 days, then 1 tab by mouth daily for 2 days 42 tablet Marney Setting, NP      PDMP not reviewed this encounter.   Marney Setting, NP 11/21/19 1228

## 2019-11-24 ENCOUNTER — Telehealth: Payer: Self-pay | Admitting: Cardiovascular Disease

## 2019-11-24 LAB — NOVEL CORONAVIRUS, NAA: SARS-CoV-2, NAA: NOT DETECTED

## 2019-11-24 NOTE — Telephone Encounter (Signed)
Wife called to report that patient was prescribed hydralazine 25 mg TID by his nephrologist (Dr. Theador Hawthorne). Wife wanted to make sure this was okay to take with his other medications. Advised that hydralazine is safe to take with his other medications. Verbalized understanding.

## 2019-11-24 NOTE — Telephone Encounter (Signed)
New message    Patient wife would like a call back - she left message on voicemail that his doctor changed his medication and she wants to speak to you about this.

## 2019-11-25 NOTE — Telephone Encounter (Signed)
Yes hydralazine is fine, well tolerated medicine, only difficult thing about it is that its 3 times daily   Zandra Abts MD

## 2019-12-04 ENCOUNTER — Ambulatory Visit (INDEPENDENT_AMBULATORY_CARE_PROVIDER_SITE_OTHER): Payer: Medicare Other | Admitting: *Deleted

## 2019-12-04 DIAGNOSIS — I480 Paroxysmal atrial fibrillation: Secondary | ICD-10-CM

## 2019-12-04 DIAGNOSIS — Z5181 Encounter for therapeutic drug level monitoring: Secondary | ICD-10-CM | POA: Diagnosis not present

## 2019-12-04 LAB — POCT INR: INR: 3.9 — AB (ref 2.0–3.0)

## 2019-12-04 NOTE — Patient Instructions (Signed)
Finished Prednisone 10mg  taper yesterday  Hold warfarin tonight then resume 1 tablet daily except 1 1/2 tablets Tuesdays, Thursdays and Saturdays.   Recheck in 3 weeks.

## 2019-12-25 ENCOUNTER — Other Ambulatory Visit: Payer: Self-pay

## 2019-12-25 ENCOUNTER — Ambulatory Visit (INDEPENDENT_AMBULATORY_CARE_PROVIDER_SITE_OTHER): Payer: Medicare Other | Admitting: Pharmacist

## 2019-12-25 DIAGNOSIS — Z5181 Encounter for therapeutic drug level monitoring: Secondary | ICD-10-CM

## 2019-12-25 DIAGNOSIS — Z7901 Long term (current) use of anticoagulants: Secondary | ICD-10-CM | POA: Diagnosis not present

## 2019-12-25 DIAGNOSIS — I480 Paroxysmal atrial fibrillation: Secondary | ICD-10-CM | POA: Diagnosis not present

## 2019-12-25 LAB — POCT INR: INR: 3.3 — AB (ref 2.0–3.0)

## 2019-12-25 NOTE — Patient Instructions (Signed)
Description   Hold warfarin tonight then resume 1 tablet daily except 1 1/2 tablets Tuesdays, Thursdays and Saturdays.   Recheck in 3 weeks.

## 2020-01-16 ENCOUNTER — Other Ambulatory Visit: Payer: Self-pay | Admitting: *Deleted

## 2020-01-19 MED ORDER — WARFARIN SODIUM 5 MG PO TABS: ORAL_TABLET | ORAL | 0 refills | Status: DC

## 2020-01-20 ENCOUNTER — Ambulatory Visit (INDEPENDENT_AMBULATORY_CARE_PROVIDER_SITE_OTHER): Payer: Medicare Other | Admitting: *Deleted

## 2020-01-20 DIAGNOSIS — I480 Paroxysmal atrial fibrillation: Secondary | ICD-10-CM

## 2020-01-20 DIAGNOSIS — Z5181 Encounter for therapeutic drug level monitoring: Secondary | ICD-10-CM

## 2020-01-20 LAB — POCT INR: INR: 2.7 (ref 2.0–3.0)

## 2020-01-20 NOTE — Patient Instructions (Signed)
Continue warfarin 1 tablet daily except 1 1/2 tablets Tuesdays, Thursdays and Saturdays.   Recheck in 4 weeks.

## 2020-01-26 ENCOUNTER — Telehealth: Payer: Self-pay | Admitting: Cardiology

## 2020-01-26 MED ORDER — CHLORTHALIDONE 25 MG PO TABS
25.0000 mg | ORAL_TABLET | Freq: Every day | ORAL | 3 refills | Status: DC | PRN
Start: 2020-01-26 — End: 2022-09-04

## 2020-01-26 MED ORDER — CHLORTHALIDONE 25 MG PO TABS
12.5000 mg | ORAL_TABLET | Freq: Every day | ORAL | 3 refills | Status: DC | PRN
Start: 2020-01-26 — End: 2020-01-26

## 2020-01-26 NOTE — Addendum Note (Signed)
Addended by: Levonne Hubert on: 01/26/2020 10:43 AM   Modules accepted: Orders

## 2020-01-26 NOTE — Telephone Encounter (Signed)
Refill complete 

## 2020-01-26 NOTE — Telephone Encounter (Signed)
*  STAT* If patient is at the pharmacy, call can be transferred to refill team.   1. Which medications need to be refilled? (please list name of each medication and dose if known) chlorthalidone (HYGROTON) 25 MG tablet [[id number]]   2. Which pharmacy/location (including street and city if local pharmacy) is medication to be sent to? [facility]  3. Do they need a 30 day or 90 day supply?  90 day

## 2020-02-17 ENCOUNTER — Ambulatory Visit (INDEPENDENT_AMBULATORY_CARE_PROVIDER_SITE_OTHER): Payer: Medicare Other | Admitting: *Deleted

## 2020-02-17 DIAGNOSIS — I480 Paroxysmal atrial fibrillation: Secondary | ICD-10-CM | POA: Diagnosis not present

## 2020-02-17 DIAGNOSIS — Z5181 Encounter for therapeutic drug level monitoring: Secondary | ICD-10-CM | POA: Diagnosis not present

## 2020-02-17 LAB — POCT INR: INR: 2.6 (ref 2.0–3.0)

## 2020-02-17 NOTE — Patient Instructions (Signed)
Continue warfarin 1 tablet daily except 1 1/2 tablets Tuesdays, Thursdays and Saturdays.   Recheck in 6 weeks.  

## 2020-03-01 ENCOUNTER — Telehealth: Payer: Self-pay | Admitting: Cardiology

## 2020-03-01 MED ORDER — WARFARIN SODIUM 5 MG PO TABS: ORAL_TABLET | ORAL | 6 refills | Status: DC

## 2020-03-01 NOTE — Telephone Encounter (Signed)
*  STAT* If patient is at the pharmacy, call can be transferred to refill team.   1. Which medications need to be refilled? (please list name of each medication and dose if known)  Coumadin 5 mg   2. Which pharmacy/location (including street and city if local pharmacy) is medication to be sent to? [facility]  3. Do they need a 30 day or 90 day supply?

## 2020-03-01 NOTE — Telephone Encounter (Signed)
Refilled coumadin as directed per L.Azucena Kuba, RN

## 2020-03-30 ENCOUNTER — Other Ambulatory Visit: Payer: Self-pay

## 2020-03-30 ENCOUNTER — Ambulatory Visit (INDEPENDENT_AMBULATORY_CARE_PROVIDER_SITE_OTHER): Payer: Medicare Other | Admitting: *Deleted

## 2020-03-30 DIAGNOSIS — Z5181 Encounter for therapeutic drug level monitoring: Secondary | ICD-10-CM | POA: Diagnosis not present

## 2020-03-30 DIAGNOSIS — I480 Paroxysmal atrial fibrillation: Secondary | ICD-10-CM | POA: Diagnosis not present

## 2020-03-30 LAB — POCT INR: INR: 3 (ref 2.0–3.0)

## 2020-03-30 NOTE — Patient Instructions (Signed)
Continue warfarin 1 tablet daily except 1 1/2 tablets Tuesdays, Thursdays and Saturdays.   Recheck in 6 weeks.  

## 2020-04-16 ENCOUNTER — Other Ambulatory Visit: Payer: Self-pay | Admitting: Student

## 2020-04-16 NOTE — Telephone Encounter (Signed)
This is a [place] pt.  °

## 2020-04-20 ENCOUNTER — Other Ambulatory Visit: Payer: Self-pay

## 2020-04-20 ENCOUNTER — Telehealth: Payer: Self-pay | Admitting: Student

## 2020-04-20 MED ORDER — DOXAZOSIN MESYLATE 4 MG PO TABS: ORAL_TABLET | ORAL | 0 refills | Status: DC

## 2020-04-20 NOTE — Telephone Encounter (Signed)
Medication refill request approved per [place], PA-C.

## 2020-04-20 NOTE — Telephone Encounter (Signed)
*  STAT* If patient is at the pharmacy, call can be transferred to refill team.   1. Which medications need to be refilled? (please list name of each medication and dose if known)  doxazosin (CARDURA) 4 MG tablet [[id number]]    2. Which pharmacy/location (including street and city if local pharmacy) is medication to be sent to? [facility]   3. Do they need a 30 day or 90 day supply?  90 day  Pt does the pre packaging of his medication, he's due for a shipment on the 24th

## 2020-05-11 ENCOUNTER — Ambulatory Visit (INDEPENDENT_AMBULATORY_CARE_PROVIDER_SITE_OTHER): Payer: Medicare Other | Admitting: *Deleted

## 2020-05-11 ENCOUNTER — Other Ambulatory Visit: Payer: Self-pay

## 2020-05-11 DIAGNOSIS — Z5181 Encounter for therapeutic drug level monitoring: Secondary | ICD-10-CM

## 2020-05-11 DIAGNOSIS — I480 Paroxysmal atrial fibrillation: Secondary | ICD-10-CM

## 2020-05-11 LAB — POCT INR: INR: 2.8 (ref 2.0–3.0)

## 2020-05-11 NOTE — Patient Instructions (Signed)
Continue warfarin 1 tablet daily except 1 1/2 tablets Tuesdays, Thursdays and Saturdays.   Recheck in 6 weeks.

## 2020-06-17 ENCOUNTER — Encounter: Payer: Self-pay | Admitting: Student

## 2020-06-17 ENCOUNTER — Other Ambulatory Visit: Payer: Self-pay

## 2020-06-17 ENCOUNTER — Ambulatory Visit: Payer: Medicare Other | Admitting: Student

## 2020-06-17 VITALS — BP 142/72 | HR 66 | Ht 72.0 in | Wt 236.0 lb

## 2020-06-17 DIAGNOSIS — I480 Paroxysmal atrial fibrillation: Secondary | ICD-10-CM

## 2020-06-17 DIAGNOSIS — G4733 Obstructive sleep apnea (adult) (pediatric): Secondary | ICD-10-CM

## 2020-06-17 DIAGNOSIS — E785 Hyperlipidemia, unspecified: Secondary | ICD-10-CM

## 2020-06-17 DIAGNOSIS — I10 Essential (primary) hypertension: Secondary | ICD-10-CM | POA: Diagnosis not present

## 2020-06-17 DIAGNOSIS — Z9989 Dependence on other enabling machines and devices: Secondary | ICD-10-CM

## 2020-06-17 NOTE — Patient Instructions (Signed)
Medication Instructions:   You can take plain Mucinex if needed for sinus congestion (DO NOT TAKE MUCINEX-D).   Labwork:  None  Testing/Procedures:  None  Follow-Up:  With Bernerd Pho, PA-C or MD in 1 year unless needed sooner.   Any Other Special Instructions Will Be Listed Below (If Applicable).     If you need a refill on your cardiac medications before your next appointment, please call your pharmacy.

## 2020-06-17 NOTE — Progress Notes (Addendum)
Cardiology Office Note    Date:  06/17/2020   ID:  Riley [name], DOB Aug 30, 1945, MRN [id number]  PCP:  Redmond School, MD  Cardiologist: Kate Sable, MD (Inactive)  --> Needs to switch to new MD  Chief Complaint  Patient presents with  . Follow-up    Annual Visit    History of Present Illness:    Riley [name] is a 75 y.o. male with past medical history of paroxysmal atrial fibrillation, HTN, HLD and OSA (on CPAP) who presents to the office today for annual follow-up.  He was last examined by Dr. Bronson Ing in 06/2019 and prior stress testing in 2018 had shown evidence of mild peri-infarct ischemia but given no recent anginal symptoms, continued medical therapy was recommended. He was continued on his current medication regimen including Coumadin for anticoagulation.  In talking with the patient today, he reports overall doing well from a cardiac perspective since his last visit. 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 Also rides 3 to 4 miles on a stationary bike at least 3 days a week.  No recent chest pain, palpitations, orthopnea, PND or lower extremity edema. He has been struggling with sinus congestion and was recently prescribed an antibiotic and steroid taper by his PCP.  He reports good compliance with Coumadin and denies any evidence of active bleeding.  Past Medical History:  Diagnosis Date  . Chronic anticoagulation   . Chronic back pain   . Diabetes mellitus, type 2 (HCC)    no insulin  . Edema   . GERD (gastroesophageal reflux disease)   . Hyperlipidemia   . Hypertension    normal coronary angiography and normal EF in 12/98  . 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 Paroxysmal atrial fibrillation ([place])    12/2005; [name]     Past Surgical History:  Procedure Laterality Date  . APPENDECTOMY  1998  . COLONOSCOPY N/A 11/26/2012    KCL:EXNTZ 2 hemorrhoids. Pancolonic diverticulosis. Colonic polyps removed as described above. One hemostasis clip applied. TUBULAR ADENOMA. Surveillance Sept 2019.   Marland Kitchen COLONOSCOPY N/A 02/06/2018   Procedure: COLONOSCOPY;  Surgeon: Daneil Dolin, MD;  Location: [facility];  Service: Endoscopy;  Laterality: N/A;  12:00pm  . INSERTION OF MESH N/A 01/27/2013   Procedure: INSERTION OF MESH;  Surgeon: Jamesetta So, MD;  Location: [facility];  Service: General;  Laterality: N/A;  . POLYPECTOMY  02/06/2018   Procedure: POLYPECTOMY;  Surgeon: Daneil Dolin, MD;  Location: [facility];  Service: Endoscopy;;  (colon)  . TONSILLECTOMY     as a child  . UMBILICAL HERNIA REPAIR N/A 01/27/2013   Procedure: HERNIA REPAIR UMBILICAL ADULT;  Surgeon: Jamesetta So, MD;  Location: [facility];  Service: General;  Laterality: N/A;    Current Medications: Outpatient Medications Prior to Visit  Medication Sig Dispense Refill  . acetaminophen (TYLENOL) 500 MG tablet Take 500 mg by mouth every 6 (six) hours as needed (for pain.).    Marland Kitchen albuterol (VENTOLIN HFA) 108 (90 Base) MCG/ACT inhaler Inhale 1-2 puffs into the lungs every 6 (six) hours as needed for wheezing or shortness of breath. 18 g 1  . calcium carbonate (TUMS - DOSED IN MG ELEMENTAL CALCIUM) 500 MG chewable tablet Chew 1 tablet by mouth as needed for indigestion or heartburn.    . chlorthalidone (HYGROTON) 25  MG tablet Take 1 tablet (25 mg total) by mouth daily as needed (swelling). 90 tablet 3  . diltiazem (CARDIZEM) 30 MG tablet Take 1 tablet (30 mg total) by mouth daily as needed (Palpations). 30 tablet 11  . DM-APAP-CPM (CORICIDIN HBP PO) Take 1 tablet by mouth daily as needed (for cold/sinus issues.).     Marland Kitchen doxazosin (CARDURA) 4 MG tablet TAKE (3) TABLETS BY MOUTH ONCE DAILY. 90 tablet 0  . glimepiride (AMARYL) 1 MG tablet Take 1 tablet by mouth daily.    . hydrALAZINE (APRESOLINE) 50 MG tablet Take 50 mg by mouth 3 (three) times daily.    .  Hydrocortisone (PREPARATION H EX) Place 1 application rectally daily as needed (irritation/discomfort).     Marland Kitchen linaclotide (LINZESS) 290 MCG CAPS capsule Take 1 capsule (290 mcg total) by mouth daily before breakfast. 30 capsule 11  . lisinopril (ZESTRIL) 40 MG tablet TAKE ONE TABLET BY MOUTH EVERY MORNING 90 tablet 2  . Magnesium Oxide 200 MG TABS Take 1 tablet (200 mg total) by mouth daily. 30 each 11  . metFORMIN (GLUCOPHAGE) 1000 MG tablet Take 1 tablet by mouth 2 (two) times daily.    . Multiple Vitamin (MULTIVITAMIN) tablet Take 1 tablet by mouth daily.    . phosphorus (K PHOS NEUTRAL) 155-852-130 MG tablet Take 250 mg by mouth 2 (two) times daily.    . pravastatin (PRAVACHOL) 20 MG tablet Take 1 tablet (20 mg total) by mouth every evening. 90 tablet 3  . predniSONE (STERAPRED UNI-PAK 21 TAB) 10 MG (21) TBPK tablet Take by mouth daily. Take 6 tabs by mouth daily  for 2 days, then 5 tabs for 2 days, then 4 tabs for 2 days, then 3 tabs for 2 days, 2 tabs for 2 days, then 1 tab by mouth daily for 2 days 42 tablet 0  . Triamcinolone Acetonide (NASACORT ALLERGY 24HR NA) Place 1 spray into the nose daily as needed (congestion).    . verapamil (CALAN-SR) 180 MG CR tablet TAKE (2) TABLETS BY MOUTH AT BEDTIME. 180 tablet 3  . warfarin (COUMADIN) 5 MG tablet Take 1 tablet daily except 1 and  1/2 tablets on Tuesdays, Thursdays and Saturdays or as directed 40 tablet 6  . hydrALAZINE (APRESOLINE) 25 MG tablet Take 1 tablet by mouth 3 (three) times daily.     No facility-administered medications prior to visit.     Allergies:   Patient has no known allergies.   Social History   Socioeconomic History  . Marital status: Married    Spouse name: Not on file  . Number of children: Not on file  . Years of education: Not on file  . Highest education level: Not on file  Occupational History  . Occupation: Retired    Fish farm manager: [employer]    Comment: Company secretary  Tobacco Use  . Smoking  status: Never Smoker  . Smokeless tobacco: Never Used  Vaping Use  . Vaping Use: Never used  Substance and Sexual Activity  . Alcohol use: No    Alcohol/week: 0.0 standard drinks  . Drug use: No  . Sexual activity: Not on file  Other Topics Concern  . Not on file  Social History Narrative  . Not on file   Social Determinants of Health   Financial Resource Strain: Not on file  Food Insecurity: Not on file  Transportation Needs: Not on file  Physical Activity: Not on file  Stress: Not on file  Social Connections: Not on  file     Family History:  The patient's family history includes Allergies in an other family member; Bronchitis in his mother; Heart disease in his father and mother.   Review of Systems:   Please see the history of present illness.     General:  No chills, fever, night sweats or weight changes.  Cardiovascular:  No chest pain, dyspnea on exertion, edema, orthopnea, palpitations, paroxysmal nocturnal dyspnea. Dermatological: No rash, lesions/masses Respiratory: No cough, dyspnea. Positive for sinus congestion.  Urologic: No hematuria, dysuria Abdominal:   No nausea, vomiting, diarrhea, bright red blood per rectum, melena, or hematemesis Neurologic:  No visual changes, wkns, changes in mental status. All other systems reviewed and are otherwise negative except as noted above.   Physical Exam:    VS:  BP (!) 142/72   Pulse 66   Ht 6' (1.829 m)   Wt 236 lb (107 kg)   SpO2 93%   BMI 32.01 kg/m    General: Well developed, well nourished,male appearing in no acute distress. Head: Normocephalic, atraumatic. Neck: No carotid bruits. JVD not elevated.  Lungs: Respirations regular and unlabored, without wheezes or rales.  Heart: Regular rate and rhythm. No S3 or S4.  No murmur, no rubs, or gallops appreciated. Abdomen: Appears non-distended. No obvious abdominal masses. Msk:  Strength and tone appear normal for age. No obvious joint deformities or  effusions. Extremities: No clubbing or cyanosis. No edema.  Distal pedal pulses are 2+ bilaterally. Neuro: Alert and oriented X 3. Moves all extremities spontaneously. No focal deficits noted. Psych:  Responds to questions appropriately with a normal affect. Skin: No rashes or lesions noted  Wt Readings from Last 3 Encounters:  06/17/20 236 lb (107 kg)  09/10/19 224 lb 3.2 oz (101.7 kg)  07/30/19 225 lb (102.1 kg)     Studies/Labs Reviewed:   EKG:  EKG is not ordered today.  EKG from 11/20/2020 is reviewed and shows NSR, HR 64 with known RBBB.   Recent Labs: No results found for requested labs within last 8760 hours.   Lipid Panel    Component Value Date/Time   CHOL 133 11/01/2016 0849   TRIG 125 11/01/2016 0849   TRIG 92 and 07/30/2006 0000   HDL 40 (L) 11/01/2016 0849   CHOLHDL 3.3 11/01/2016 0849   VLDL 25 11/01/2016 0849   LDLCALC 68 11/01/2016 0849   LDLCALC 48 07/30/2006 0000    Additional studies/ records that were reviewed today include:   Echocardiogram: 04/2012 Study Conclusions   - Left ventricle: The cavity size was normal. Wall thickness  was increased in a pattern of moderate LVH. Systolic  function was normal. The estimated ejection fraction was  in the range of 60% to 65%. Wall motion was normal; there  were no regional wall motion abnormalities. The study is  not technically sufficient to allow evaluation of LV  diastolic function. Atrial flutter with variable block  noted during the study. No previous exam for comparison.  - Mitral valve: Trivial regurgitation.  - Left atrium: The atrium was mildly dilated.  - Right ventricle: The cavity size was mildly dilated with  prominent apical trabeculation. The moderator band was  prominent. Systolic function was normal.  - Tricuspid valve: Trivial regurgitation.  - Pulmonary arteries: Systolic pressure could not be  accurately estimated.  - Inferior vena cava: Not well visualized.  -  Pericardium, extracardiac: There was no pericardial  effusion.    NST: 11/2016  There was no ST segment deviation  noted during stress.  The left ventricular ejection fraction is normal (55-65%).  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  This is a low risk study. Overall mild area of mycardoium currently at jeopardy.     Assessment:    1. Paroxysmal atrial fibrillation (HCC)   2. Essential hypertension   3. Hyperlipidemia LDL goal <70   4. OSA on CPAP      Plan:   In order of problems listed above:  1. Paroxysmal Atrial Fibrillation - He denies any recent palpitations and is in NSR by examination today. Continue Verapamil 360 mg daily and he does have an Rx for as needed Cardizem but has not had to utilize this within the past year. We discussed obtaining a repeat echocardiogram at the time of his visit next year for reassessment given the time since his last study.  - He denies any evidence of active bleeding. Continue Coumadin for anticoagulation. By review of Care Everywhere, his hemoglobin was stable at 15.1 and platelets at 196 when checked in 04/2020.  2. HTN - BP was initially recorded at 150/80, rechecked and improved to 142/72.  He reports Hydralazine was recently titrated by Nephrology. I encouraged him to continue to follow readings at home as this may require further titration. He remains on Cardura 4 mg TID, Hydralazine 50 mg TID, Lisinopril 40 mg daily and Verapamil 360 mg daily. Creatinine was stable at 1.16 in 04/2020.  3. HLD - Followed by PCP. He remains on Pravastatin 20 mg daily.  Will request a copy of most recent labs from his PCP.  4. OSA - Continued compliance with CPAP encouraged.   Medication Adjustments/Labs and Tests Ordered: Current medicines are reviewed at length with the patient today.  Concerns regarding medicines are outlined above.   Medication changes, Labs and Tests ordered today are listed in the Patient Instructions below. Patient Instructions  Medication Instructions:   You can take plain Mucinex if needed for sinus congestion (DO NOT TAKE MUCINEX-D).   Labwork:  None  Testing/Procedures:  None  Follow-Up:  With Bernerd Pho, PA-C or MD in 1 year unless needed sooner.   Any Other Special Instructions Will Be Listed Below (If Applicable).     If you need a refill on your cardiac medications before your next appointment, please call your pharmacy.      Signed, Erma Heritage, PA-C  06/17/2020 4:47 PM    [facility] [address] [place], [place] [address] Phone: [phone] Fax: [phone]

## 2020-06-22 ENCOUNTER — Ambulatory Visit (INDEPENDENT_AMBULATORY_CARE_PROVIDER_SITE_OTHER): Payer: Medicare Other | Admitting: *Deleted

## 2020-06-22 DIAGNOSIS — I480 Paroxysmal atrial fibrillation: Secondary | ICD-10-CM | POA: Diagnosis not present

## 2020-06-22 DIAGNOSIS — Z5181 Encounter for therapeutic drug level monitoring: Secondary | ICD-10-CM | POA: Diagnosis not present

## 2020-06-22 LAB — POCT INR: INR: 3.2 — AB (ref 2.0–3.0)

## 2020-06-22 NOTE — Patient Instructions (Signed)
Hold warfarin tonight then resume 1 tablet daily except 1 1/2 tablets Tuesdays, Thursdays and Saturdays.   Recheck in 6 weeks.

## 2020-07-17 ENCOUNTER — Other Ambulatory Visit: Payer: Self-pay

## 2020-07-17 DIAGNOSIS — I83893 Varicose veins of bilateral lower extremities with other complications: Secondary | ICD-10-CM

## 2020-07-19 ENCOUNTER — Other Ambulatory Visit: Payer: Self-pay

## 2020-07-19 MED ORDER — VERAPAMIL HCL ER 180 MG PO TBCR: EXTENDED_RELEASE_TABLET | ORAL | 3 refills | Status: DC

## 2020-07-19 NOTE — Telephone Encounter (Signed)
Refilled verapamil 180 mg, take 2 at bedtime # 180 [facility]

## 2020-07-21 ENCOUNTER — Telehealth: Payer: Self-pay

## 2020-07-21 MED ORDER — DOXAZOSIN MESYLATE 4 MG PO TABS: ORAL_TABLET | ORAL | 3 refills | Status: DC

## 2020-07-21 NOTE — Telephone Encounter (Signed)
Medication refill request for Doxazosin 4 mg tablets approved and sent to [facility].

## 2020-08-03 ENCOUNTER — Ambulatory Visit (INDEPENDENT_AMBULATORY_CARE_PROVIDER_SITE_OTHER): Payer: Medicare Other | Admitting: *Deleted

## 2020-08-03 DIAGNOSIS — Z5181 Encounter for therapeutic drug level monitoring: Secondary | ICD-10-CM | POA: Diagnosis not present

## 2020-08-03 DIAGNOSIS — I480 Paroxysmal atrial fibrillation: Secondary | ICD-10-CM | POA: Diagnosis not present

## 2020-08-03 LAB — POCT INR: INR: 2.3 (ref 2.0–3.0)

## 2020-08-03 NOTE — Patient Instructions (Signed)
Continue 1 tablet daily except 1 1/2 tablets Tuesdays, Thursdays and Saturdays.   Recheck in 6 weeks.

## 2020-08-12 ENCOUNTER — Encounter (HOSPITAL_COMMUNITY): Payer: Medicare Other

## 2020-08-31 ENCOUNTER — Encounter: Payer: Self-pay | Admitting: Internal Medicine

## 2020-09-01 ENCOUNTER — Encounter: Payer: Self-pay | Admitting: Internal Medicine

## 2020-09-07 ENCOUNTER — Encounter: Payer: Self-pay | Admitting: Vascular Surgery

## 2020-09-07 ENCOUNTER — Ambulatory Visit: Payer: Medicare Other | Admitting: Vascular Surgery

## 2020-09-07 ENCOUNTER — Ambulatory Visit (HOSPITAL_COMMUNITY)
Admission: RE | Admit: 2020-09-07 | Discharge: 2020-09-07 | Disposition: A | Discharge status: Home / Self Care | Payer: Medicare Other | Source: Ambulatory Visit | Attending: Vascular Surgery | Admitting: Vascular Surgery

## 2020-09-07 ENCOUNTER — Other Ambulatory Visit: Payer: Self-pay

## 2020-09-07 DIAGNOSIS — I872 Venous insufficiency (chronic) (peripheral): Secondary | ICD-10-CM | POA: Diagnosis not present

## 2020-09-07 DIAGNOSIS — I83893 Varicose veins of bilateral lower extremities with other complications: Secondary | ICD-10-CM | POA: Diagnosis present

## 2020-09-07 NOTE — Progress Notes (Signed)
Patient name: Riley [name] MRN: [id number] DOB: 1946/02/12 Sex: male  REASON FOR CONSULT: Evaluate for lower extremity venous insufficiency  HPI: Riley [name] is a 75 y.o. male, with history of A. fib on anticoagulation (coumadin), diabetes, hypertension, hyperlipidemia that presents for evaluation of lower extremity venous insufficiency.  Patient states he has really had no lower extremity symptoms and his legs have been discolored for at least 12 years.  He denies any significant swelling, burning, achiness etc.  He works in his garden for long periods of time and elevates his legs at night.  States he saw his PCP Dr. Gerarda Fraction who recommended referral here to vascular surgery for further evaluation.  He denies any history of lower extremity DVTs.  No other trauma.  Really has no complaints with his legs other than discoloration.  He had a left leg reflux study today.  Past Medical History:  Diagnosis Date   Chronic anticoagulation    Chronic back pain    Diabetes mellitus, type 2 (HCC)    no insulin   Edema    GERD (gastroesophageal reflux disease)    Hyperlipidemia    Hypertension    normal coronary angiography and normal EF in 12/98   Nodule of left lung    Granuloma in left upper lobe   Paroxysmal atrial fibrillation ([place])    12/2005; sinus bradycardia secondary to medications   Sleep apnea     Past Surgical History:  Procedure Laterality Date   APPENDECTOMY  1998   COLONOSCOPY N/A 11/26/2012   JSH:FWYOV 2 hemorrhoids. Pancolonic diverticulosis. Colonic polyps removed as described above. One hemostasis clip applied. TUBULAR ADENOMA. Surveillance Sept 2019.    COLONOSCOPY N/A 02/06/2018   Procedure: COLONOSCOPY;  Surgeon: Daneil Dolin, MD;  [facility];  Service: Endoscopy;  Laterality: N/A;  12:00pm   INSERTION OF MESH N/A 01/27/2013   Procedure: INSERTION OF MESH;  Surgeon: Jamesetta So, MD;  Location: [facility];  Service: General;  Laterality: N/A;   POLYPECTOMY   02/06/2018   Procedure: POLYPECTOMY;  Surgeon: Daneil Dolin, MD;  [facility];  Service: Endoscopy;;  (colon)   TONSILLECTOMY     as a child   UMBILICAL HERNIA REPAIR N/A 01/27/2013   Procedure: HERNIA REPAIR UMBILICAL ADULT;  Surgeon: Jamesetta So, MD;  Location: [facility];  Service: General;  Laterality: N/A;    Family History  Problem Relation Age of Onset   Heart disease Father    Heart disease Mother    Bronchitis Mother    Allergies Other        whole family   Colon cancer Neg Hx     SOCIAL HISTORY: Social History   Socioeconomic History   Marital status: Married    Spouse name: Not on file   Number of children: Not on file   Years of education: Not on file   Highest education level: Not on file  Occupational History   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  Tobacco Use   Smoking status: Never   Smokeless tobacco: Never  Vaping Use   Vaping Use: Never used  Substance and Sexual Activity   Alcohol use: No    Alcohol/week: 0.0 standard drinks   Drug use: No   Sexual activity: Not on file  Other Topics Concern   Not on file  Social History Narrative   Not on file   Social Determinants of  Health   Financial Resource Strain: Not on file  Food Insecurity: Not on file  Transportation Needs: Not on file  Physical Activity: Not on file  Stress: Not on file  Social Connections: Not on file  Intimate Partner Violence: Not on file    No Known Allergies  Current Outpatient Medications  Medication Sig Dispense Refill   acetaminophen (TYLENOL) 500 MG tablet Take 500 mg by mouth every 6 (six) hours as needed (for pain.).     albuterol (VENTOLIN HFA) 108 (90 Base) MCG/ACT inhaler Inhale 1-2 puffs into the lungs every 6 (six) hours as needed for wheezing or shortness of breath. 18 g 1   calcium carbonate (TUMS - DOSED IN MG ELEMENTAL CALCIUM) 500 MG chewable tablet Chew 1 tablet by mouth as needed for  indigestion or heartburn.     chlorthalidone (HYGROTON) 25 MG tablet Take 1 tablet (25 mg total) by mouth daily as needed (swelling). 90 tablet 3   diltiazem (CARDIZEM) 30 MG tablet Take 1 tablet (30 mg total) by mouth daily as needed (Palpations). 30 tablet 11   DM-APAP-CPM (CORICIDIN HBP PO) Take 1 tablet by mouth daily as needed (for cold/sinus issues.).      doxazosin (CARDURA) 4 MG tablet TAKE (3) TABLETS BY MOUTH ONCE DAILY. 90 tablet 3   glimepiride (AMARYL) 1 MG tablet Take 1 tablet by mouth daily.     hydrALAZINE (APRESOLINE) 50 MG tablet Take 50 mg by mouth 3 (three) times daily.     Hydrocortisone (PREPARATION H EX) Place 1 application rectally daily as needed (irritation/discomfort).      linaclotide (LINZESS) 290 MCG CAPS capsule Take 1 capsule (290 mcg total) by mouth daily before breakfast. 30 capsule 11   lisinopril (ZESTRIL) 40 MG tablet TAKE ONE TABLET BY MOUTH EVERY MORNING 90 tablet 2   Magnesium Oxide 200 MG TABS Take 1 tablet (200 mg total) by mouth daily. 30 each 11   metFORMIN (GLUCOPHAGE) 1000 MG tablet Take 1 tablet by mouth 2 (two) times daily.     Multiple Vitamin (MULTIVITAMIN) tablet Take 1 tablet by mouth daily.     phosphorus (K PHOS NEUTRAL) 155-852-130 MG tablet Take 250 mg by mouth 2 (two) times daily.     pravastatin (PRAVACHOL) 20 MG tablet Take 1 tablet (20 mg total) by mouth every evening. 90 tablet 3   predniSONE (STERAPRED UNI-PAK 21 TAB) 10 MG (21) TBPK tablet Take by mouth daily. Take 6 tabs by mouth daily  for 2 days, then 5 tabs for 2 days, then 4 tabs for 2 days, then 3 tabs for 2 days, 2 tabs for 2 days, then 1 tab by mouth daily for 2 days 42 tablet 0   Triamcinolone Acetonide (NASACORT ALLERGY 24HR NA) Place 1 spray into the nose daily as needed (congestion).     verapamil (CALAN-SR) 180 MG CR tablet TAKE (2) TABLETS BY MOUTH AT BEDTIME. 180 tablet 3   warfarin (COUMADIN) 5 MG tablet Take 1 tablet daily except 1 and  1/2 tablets on Tuesdays,  Thursdays and Saturdays or as directed 40 tablet 6   No current facility-administered medications for this visit.    REVIEW OF SYSTEMS:  [X]  denotes positive finding, [ ]  denotes negative finding Cardiac  Comments:  Chest pain or chest pressure:    Shortness of breath upon exertion:    Short of breath when lying flat:    Irregular heart rhythm:        Vascular    Pain  in calf, thigh, or hip brought on by ambulation:    Pain in feet at night that wakes you up from your sleep:     Blood clot in your veins:    Leg swelling:         Pulmonary    Oxygen at home:    Productive cough:     Wheezing:         Neurologic    Sudden weakness in arms or legs:     Sudden numbness in arms or legs:     Sudden onset of difficulty speaking or slurred speech:    Temporary loss of vision in one eye:     Problems with dizziness:         Gastrointestinal    Blood in stool:     Vomited blood:         Genitourinary    Burning when urinating:     Blood in urine:        Psychiatric    Major depression:         Hematologic    Bleeding problems:    Problems with blood clotting too easily:        Skin    Rashes or ulcers:        Constitutional    Fever or chills:      PHYSICAL EXAM: Vitals:   09/07/20 1232  BP: (!) 158/86  Pulse: 62  Resp: 18  Temp: 97.9 F (36.6 C)  TempSrc: Temporal  SpO2: 95%  Weight: 226 lb (102.5 kg)  Height: 6' (1.829 m)    GENERAL: The patient is a well-nourished male, in no acute distress. The vital signs are documented above. CARDIAC: There is a regular rate and rhythm.  VASCULAR:  Palpable femoral pulses bilaterally Palpable dorsalis pedis pulses bilaterally Bilateral lower extremity lipodermatosclerosis with skin pigmentation and no open ulcers PULMONARY: No respiratory distress. ABDOMEN: Soft and non-tender. MUSCULOSKELETAL: There are no major deformities or cyanosis. NEUROLOGIC: No focal weakness or paresthesias are detected. SKIN: There  are no ulcers or rashes noted. PSYCHIATRIC: The patient has a normal affect.  DATA:   Lower Venous Reflux Study   Patient Name:  Riley [name]  Date of Exam:   09/07/2020  Medical Rec #: [id number]    Accession #:    [id number]  Date of Birth: 1946-02-25   Patient Gender: M  Patient Age:   74Y  Exam Location:  [facility]  Procedure:      VAS [place] LOWER EXTREMITY VENOUS REFLUX  Referring Phys: [place]    ---------------------------------------------------------------------------  -----     Indications: Varicosities, and Edema.  Other Indications: Paroxysmal atrial fibrillation, HTN, DM.   Risk Factors: Injury many years ago.  Performing Technologist: Alvia Grove RVT      Examination Guidelines: A complete evaluation includes B-mode imaging,  spectral  Doppler, color Doppler, and power Doppler as needed of all accessible  portions  of each vessel. Bilateral testing is considered an integral part of a  complete  examination. Limited examinations for reoccurring indications may be  performed  as noted. The reflux portion of the exam is performed with the patient in  reverse Trendelenburg.  Significant venous reflux is defined as >500 ms in the superficial venous  system, and >1 second in the deep venous system.      +--------------+---------+------+-----------+------------+--------+  LEFT          Reflux NoRefluxReflux TimeDiameter cmsComments  Yes                                   +--------------+---------+------+-----------+------------+--------+  CFV                     yes   >1 second                       +--------------+---------+------+-----------+------------+--------+  FV mid        no                                              +--------------+---------+------+-----------+------------+--------+  Popliteal     no                                               +--------------+---------+------+-----------+------------+--------+  GSV at SFJ              yes    >500 ms      0.80              +--------------+---------+------+-----------+------------+--------+  GSV prox thighno                            0.30              +--------------+---------+------+-----------+------------+--------+  GSV mid thigh no                            0.36              +--------------+---------+------+-----------+------------+--------+  GSV dist thigh          yes    >500 ms      0.31              +--------------+---------+------+-----------+------------+--------+  GSV at knee   no                            0.40              +--------------+---------+------+-----------+------------+--------+  GSV prox calf no                            0.27              +--------------+---------+------+-----------+------------+--------+  GSV mid calf  no                            0.24              +--------------+---------+------+-----------+------------+--------+  GSV dist calf no                            0.36              +--------------+---------+------+-----------+------------+--------+  SSV Pop Fossa no                            0.18              +--------------+---------+------+-----------+------------+--------+  SSV prox calf no                            0.26              +--------------+---------+------+-----------+------------+--------+  SSV mid calf            yes    >500 ms      0.21              +--------------+---------+------+-----------+------------+--------+           Summary:  Left:  - No evidence of deep vein thrombosis seen in the left lower extremity,  from the common femoral through the popliteal veins.  - Venous reflux is noted in the left common femoral vein.  - Venous reflux is noted in the left sapheno-femoral junction.  - Venous reflux is noted in the left greater  saphenous vein in the thigh.  - Venous reflux is noted in the left short saphenous vein.     *See table(s) above for measurements and observations.   Assessment/Plan:  75 year old male presents for evaluation of bilateral lower extremity venous insufficiency.  On exam he does have evidence of lipodermatosclerosis with skin pigmentation.  He only had his left leg studied today for reflux study and this does show both superficial and deep reflux throughout multiple segments as noted above on his reflux study.  That being said he denies any symptoms including leg swelling, achiness, burning, stinging etc.  States he has noticed the discoloration for at least 12 years and it does not bother him.  He denies any history of open ulcerations.  Discussed that based on his left leg reflux that he would not be a candidate for laser ablation and given he does not have any symptoms he really does not need any intervention at this time.  I discussed typically offering leg elevation with compression therapy for conservative management.  Given he spends most of his day working in the garden he is amendable to leg elevation but really does not want compression stockings at this time which is understandable without symptoms.  Offered follow-up as needed if he feels his symptoms worsen and we discussed concerning findings like venous ulcerations etc.   Marty Heck, MD [facility] Office: [phone]

## 2020-09-10 ENCOUNTER — Other Ambulatory Visit: Payer: Self-pay

## 2020-09-10 ENCOUNTER — Ambulatory Visit (INDEPENDENT_AMBULATORY_CARE_PROVIDER_SITE_OTHER): Payer: Medicare Other | Admitting: *Deleted

## 2020-09-10 DIAGNOSIS — I480 Paroxysmal atrial fibrillation: Secondary | ICD-10-CM

## 2020-09-10 DIAGNOSIS — Z5181 Encounter for therapeutic drug level monitoring: Secondary | ICD-10-CM

## 2020-09-10 LAB — POCT INR: INR: 2.1 (ref 2.0–3.0)

## 2020-09-10 NOTE — Patient Instructions (Signed)
Continue 1 tablet daily except 1 1/2 tablets Tuesdays, Thursdays and Saturdays.   Recheck in 6 weeks.

## 2020-10-10 ENCOUNTER — Other Ambulatory Visit: Payer: Self-pay | Admitting: Cardiology

## 2020-10-17 ENCOUNTER — Other Ambulatory Visit: Payer: Self-pay | Admitting: Gastroenterology

## 2020-10-17 DIAGNOSIS — K5900 Constipation, unspecified: Secondary | ICD-10-CM

## 2020-10-22 ENCOUNTER — Other Ambulatory Visit: Payer: Self-pay | Admitting: Student

## 2020-10-25 ENCOUNTER — Ambulatory Visit (INDEPENDENT_AMBULATORY_CARE_PROVIDER_SITE_OTHER): Payer: Medicare Other | Admitting: *Deleted

## 2020-10-25 DIAGNOSIS — I480 Paroxysmal atrial fibrillation: Secondary | ICD-10-CM

## 2020-10-25 DIAGNOSIS — Z5181 Encounter for therapeutic drug level monitoring: Secondary | ICD-10-CM | POA: Diagnosis not present

## 2020-10-25 LAB — POCT INR: INR: 3 (ref 2.0–3.0)

## 2020-10-25 NOTE — Patient Instructions (Signed)
Continue 1 tablet daily except 1 1/2 tablets Tuesdays, Thursdays and Saturdays or as directed Recheck in 6 wks

## 2020-11-11 ENCOUNTER — Ambulatory Visit: Payer: Medicare Other | Admitting: Pulmonary Disease

## 2020-11-11 ENCOUNTER — Encounter: Payer: Self-pay | Admitting: Pulmonary Disease

## 2020-11-11 ENCOUNTER — Other Ambulatory Visit: Payer: Self-pay

## 2020-11-11 DIAGNOSIS — J302 Other seasonal allergic rhinitis: Secondary | ICD-10-CM

## 2020-11-11 DIAGNOSIS — G4733 Obstructive sleep apnea (adult) (pediatric): Secondary | ICD-10-CM | POA: Diagnosis not present

## 2020-11-11 NOTE — Addendum Note (Signed)
Addended by: Fritzi Mandes D on: 11/11/2020 01:21 PM   Modules accepted: Orders

## 2020-11-11 NOTE — Patient Instructions (Signed)
Rx for new autoCPAP 10-15 cm will be sent to DME Get new mask

## 2020-11-11 NOTE — Assessment & Plan Note (Signed)
CPAP download was reviewed and shows residual AHI of 6/hour on 11 cm, uncertain nights as high as 20/hour indicating that he may have a positional component. His machine is 74 years old and he would qualify for a new machine. We will write for auto CPAP settings 10 to 15 cm because he may need higher pressure of 11 cm depending on body position during sleep.  I will asked him to go and get a new mask from DME right away since his current mask appears to be old.  His usage is excellent about 6 hours every night and I complemented him on this  Weight loss encouraged, compliance with goal of at least 4-6 hrs every night is the expectation. Advised against medications with sedative side effects Cautioned against driving when sleepy - understanding that sleepiness will vary on a day to day basis

## 2020-11-11 NOTE — Assessment & Plan Note (Signed)
Well-controlled he has rare use of albuterol and we will refill as required

## 2020-11-11 NOTE — Progress Notes (Signed)
   Subjective:    Patient ID: GE SLASKI, male    DOB: 05-24-1945, 75 y.o.   MRN: [id number]  HPI  75 yo man with AFib followed for OSA  -night shift worker x 30y  Chief Complaint  Patient presents with   Follow-up    Patient states he has no SOB. Some coughing but thinks its related to allergies. States sometimes he coughs up clear/white mucus.    He has been sleeping well.  Reports occasional dryness in the mornings.  No nasal congestion, no problems with mask or pressure. He uses albuterol MDI rarely for shortness of breath, denies nocturnal wheezing.  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 Also denies presyncope, palpitations, heartburn, abdominal pain, nausea, vomiting, diarrhea or change in bowel or urinary habits, dysuria,hematuria, rash, arthralgias, visual complaints, headache, numbness weakness or ataxia.     Objective:   Physical Exam  Gen. Pleasant, obese, in no distress ENT - no lesions, no post nasal drip Neck: No JVD, no thyromegaly, no carotid bruits Lungs: no use of accessory muscles, no dullness to percussion, decreased without rales or rhonchi  Cardiovascular: Rhythm regular, heart sounds  normal, no murmurs or gallops, no peripheral edema Musculoskeletal: No deformities, no cyanosis or clubbing , no tremors       Assessment & Plan:

## 2020-11-15 ENCOUNTER — Telehealth: Payer: Self-pay | Admitting: Pulmonary Disease

## 2020-11-15 NOTE — Telephone Encounter (Signed)
Called and spoke with patient's spouse Hilda Blades. She stated that he was seen by RA on 11/11/20 and an order for cpap machine had been placed. She was calling to confirm that the order had been sent to [facility]. I reviewed his chart and did see where the order had been sent on Friday at 1041am. She is aware of this.   She will reach back out to [place] in regards to the order.   Nothing further needed at time of call.

## 2020-12-06 ENCOUNTER — Other Ambulatory Visit: Payer: Self-pay

## 2020-12-06 ENCOUNTER — Ambulatory Visit (INDEPENDENT_AMBULATORY_CARE_PROVIDER_SITE_OTHER): Payer: Medicare Other | Admitting: *Deleted

## 2020-12-06 DIAGNOSIS — I480 Paroxysmal atrial fibrillation: Secondary | ICD-10-CM | POA: Diagnosis not present

## 2020-12-06 DIAGNOSIS — Z5181 Encounter for therapeutic drug level monitoring: Secondary | ICD-10-CM

## 2020-12-06 LAB — POCT INR: INR: 3 (ref 2.0–3.0)

## 2020-12-06 NOTE — Patient Instructions (Signed)
Continue 1 tablet daily except 1 1/2 tablets Tuesdays, Thursdays and Saturdays or as directed Recheck in 6 wks

## 2021-01-10 ENCOUNTER — Other Ambulatory Visit: Payer: Self-pay | Admitting: Student

## 2021-01-13 ENCOUNTER — Other Ambulatory Visit: Payer: Self-pay | Admitting: Student

## 2021-01-17 ENCOUNTER — Encounter: Payer: Self-pay | Admitting: Gastroenterology

## 2021-01-17 ENCOUNTER — Ambulatory Visit (INDEPENDENT_AMBULATORY_CARE_PROVIDER_SITE_OTHER): Payer: Medicare Other | Admitting: *Deleted

## 2021-01-17 ENCOUNTER — Other Ambulatory Visit: Payer: Self-pay

## 2021-01-17 DIAGNOSIS — Z5181 Encounter for therapeutic drug level monitoring: Secondary | ICD-10-CM

## 2021-01-17 DIAGNOSIS — I480 Paroxysmal atrial fibrillation: Secondary | ICD-10-CM | POA: Diagnosis not present

## 2021-01-17 LAB — POCT INR: INR: 3 (ref 2.0–3.0)

## 2021-01-17 NOTE — Patient Instructions (Signed)
Continue 1 tablet daily except 1 1/2 tablets Tuesdays, Thursdays and Saturdays or as directed Recheck in 6 wks

## 2021-01-17 NOTE — Progress Notes (Signed)
Referring Provider: Redmond School, MD Primary Care Physician:  Redmond School, MD Primary GI: Dr. Gala Romney   Chief Complaint  Patient presents with   Gastroesophageal Reflux    occ   Constipation    Having bm's more now    HPI:   Riley [name] is a 75 y.o. male presenting today with a history of constipation and GERD. Has previously trialed Miralax, fiber, Linzess 72 mcg and 145 mcg. Linzess 290 mcg works best.    Abdominal pain improved. Bowel habits much better. Lower abdominal discomfort for a few minutes in morning and then better. Sometimes slight soreness in epigastric area. Present for about 4-5 months. No dysphagia. Intermittent GERD. Will take Tums, which takes care of symptoms. Tums about one to 2 times a month. Greasy foods cause the biggest issues. Wants to hold off on EGD. No prior EGD. No PPI.    Past Medical History:  Diagnosis Date   Chronic anticoagulation    Chronic back pain    Diabetes mellitus, type 2 (HCC)    no insulin   Edema    GERD (gastroesophageal reflux disease)    Hyperlipidemia    Hypertension    normal coronary angiography and normal EF in 12/98   Nodule of left lung    Granuloma in left upper lobe   Paroxysmal atrial fibrillation ([place])    12/2005; sinus bradycardia secondary to medications   Sleep apnea     Past Surgical History:  Procedure Laterality Date   APPENDECTOMY  1998   COLONOSCOPY N/A 11/26/2012   GGY:IRSWN 2 hemorrhoids. Pancolonic diverticulosis. Colonic polyps removed as described above. One hemostasis clip applied. TUBULAR ADENOMA. Surveillance Sept 2019.    COLONOSCOPY N/A 02/06/2018   two 3-5 mm cecal polyps, pancolonic diverticulosis. tubular adenoma. no surveillance due to age   [place] N/A 01/27/2013   Procedure: INSERTION OF MESH;  Surgeon: Jamesetta So, MD;  Location: [facility];  Service: General;  Laterality: N/A;   POLYPECTOMY  02/06/2018   Procedure: POLYPECTOMY;  Surgeon: Daneil Dolin, MD;   [facility];  Service: Endoscopy;;  (colon)   TONSILLECTOMY     as a child   UMBILICAL HERNIA REPAIR N/A 01/27/2013   Procedure: HERNIA REPAIR UMBILICAL ADULT;  Surgeon: Jamesetta So, MD;  Location: [facility];  Service: General;  Laterality: N/A;    Current Outpatient Medications  Medication Sig Dispense Refill   acetaminophen (TYLENOL) 500 MG tablet Take 500 mg by mouth every 6 (six) hours as needed (for pain.).     calcium carbonate (TUMS - DOSED IN MG ELEMENTAL CALCIUM) 500 MG chewable tablet Chew 1 tablet by mouth as needed for indigestion or heartburn.     chlorthalidone (HYGROTON) 25 MG tablet Take 1 tablet (25 mg total) by mouth daily as needed (swelling). 90 tablet 3   diltiazem (CARDIZEM) 30 MG tablet Take 1 tablet (30 mg total) by mouth daily as needed (Palpations). 30 tablet 11   DM-APAP-CPM (CORICIDIN HBP PO) Take 1 tablet by mouth daily as needed (for cold/sinus issues.).      doxazosin (CARDURA) 4 MG tablet TAKE THREE TABLETS BY MOUTH EVERY EVENING 270 tablet 3   glimepiride (AMARYL) 1 MG tablet Take 1 tablet by mouth daily.     hydrALAZINE (APRESOLINE) 100 MG tablet Take 1 tablet by mouth 3 (three) times daily.     albuterol (VENTOLIN HFA) 108 (90 Base) MCG/ACT inhaler Inhale 1-2 puffs into the lungs  every 6 (six) hours as needed for wheezing or shortness of breath. (Patient not taking: Reported on 01/18/2021) 18 g 1   hydrALAZINE (APRESOLINE) 50 MG tablet Take 50 mg by mouth 3 (three) times daily. (Patient not taking: Reported on 01/18/2021)     Hydrocortisone (PREPARATION H EX) Place 1 application rectally daily as needed (irritation/discomfort).      LINZESS 290 MCG CAPS capsule TAKE ONE CAPSULE BY MOUTH every morning 30 capsule 11   lisinopril (ZESTRIL) 40 MG tablet TAKE ONE TABLET BY MOUTH EVERY MORNING 90 tablet 2   Magnesium Oxide 200 MG TABS Take 1 tablet (200 mg total) by mouth daily. 30 each 11   metFORMIN (GLUCOPHAGE) 1000 MG tablet Take 1 tablet by mouth 2  (two) times daily.     Multiple Vitamin (MULTIVITAMIN) tablet Take 1 tablet by mouth daily.     pravastatin (PRAVACHOL) 20 MG tablet Take 1 tablet (20 mg total) by mouth every evening. 90 tablet 3   Triamcinolone Acetonide (NASACORT ALLERGY 24HR NA) Place 1 spray into the nose daily as needed (congestion).     verapamil (CALAN-SR) 180 MG CR tablet TAKE (2) TABLETS BY MOUTH AT BEDTIME. 180 tablet 3   warfarin (COUMADIN) 5 MG tablet TAKE ONE TABLET BY MOUTH ONCE DAILY EXCEPT TAKE 1 AND 1/2 TABLETS ON TUESDAYS, THURSDAYS AND SATURDAYS OR AS DIRECTED 40 tablet 6   No current facility-administered medications for this visit.    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 Denies fatigue, weakness, weight loss.  CV: Denies chest pain, palpitations, syncope, peripheral edema, and  claudication. Resp: Denies dyspnea at rest, cough, wheezing, coughing up blood, and pleurisy. GI:see HPI Derm: Denies rash, itching, dry skin Psych: Denies depression, anxiety, memory loss, confusion. No homicidal or suicidal ideation.  Heme: Denies bruising, bleeding, and enlarged lymph nodes.  Physical Exam: BP (!) 150/68   Pulse 99   Temp (!) 97.1 F (36.2 C) (Temporal)   Ht 6' (1.829 m)   Wt 233 lb 3.2 oz (105.8 kg)   BMI 31.63 kg/m  General:   Alert and oriented. No distress  noted. Pleasant and cooperative.  Head:  Normocephalic and atraumatic. Eyes:  Conjuctiva clear without scleral icterus. Mouth:  mask in place Abdomen:  +BS, soft, non-tender and non-distended. No rebound or guarding. No HSM or masses noted. Msk:  Symmetrical without gross deformities. Normal posture. Extremities:  Without edema. Neurologic:  Alert and  oriented x4 Psych:  Alert and cooperative. Normal mood and affect.  ASSESSMENT: Riley [name] is a 75 y.o. male presenting today 75 y.o. male presenting today with a history of constipation and GERD. Has previously trialed Miralax, fiber, Linzess 72 mcg and 145 mcg. Linzess 290 mcg works best.   Constipation: doing well on Linzess 290 mcg daily. Will continue this.   Noting dyspepsia for past 4-5 months. Declining EGD. No PPI currently. Recommend EGD when willing. Will start on Protonix once daily.      PLAN:  Start Protonix once daily Continue Linzess Return in 6 months Call if agreeable to EGD in interim   Annitta Needs, PhD, [facility]

## 2021-01-18 ENCOUNTER — Encounter: Payer: Self-pay | Admitting: Gastroenterology

## 2021-01-18 ENCOUNTER — Ambulatory Visit: Payer: Medicare Other | Admitting: Gastroenterology

## 2021-01-18 VITALS — BP 150/68 | HR 99 | Temp 97.1°F | Ht 72.0 in | Wt 233.2 lb

## 2021-01-18 DIAGNOSIS — K219 Gastro-esophageal reflux disease without esophagitis: Secondary | ICD-10-CM

## 2021-01-18 DIAGNOSIS — K5900 Constipation, unspecified: Secondary | ICD-10-CM

## 2021-01-18 MED ORDER — PANTOPRAZOLE SODIUM 40 MG PO TBEC: 40.0000 mg | DELAYED_RELEASE_TABLET | Freq: Every day | ORAL | 3 refills | Status: DC

## 2021-01-18 NOTE — Patient Instructions (Signed)
I have sent in pantoprazole (Protonix) to take once each morning, 30 minutes before breakfast. This is for heartburn/reflux. Let me know if you have worsening symptoms or upper belly pain.  We will see you in 6 months! I am glad Linzess is working so well for you!  It was a pleasure to see you today. I want to create trusting relationships with patients to provide genuine, compassionate, and quality care. I value your feedback. If you receive a survey regarding your visit,  I greatly appreciate you taking time to fill this out.   Annitta Needs, PhD, ANP-BC [facility]

## 2021-03-01 ENCOUNTER — Ambulatory Visit (INDEPENDENT_AMBULATORY_CARE_PROVIDER_SITE_OTHER): Payer: Medicare Other | Admitting: *Deleted

## 2021-03-01 ENCOUNTER — Other Ambulatory Visit: Payer: Self-pay

## 2021-03-01 DIAGNOSIS — Z5181 Encounter for therapeutic drug level monitoring: Secondary | ICD-10-CM | POA: Diagnosis not present

## 2021-03-01 DIAGNOSIS — I480 Paroxysmal atrial fibrillation: Secondary | ICD-10-CM

## 2021-03-01 LAB — POCT INR: INR: 2.3 (ref 2.0–3.0)

## 2021-03-01 NOTE — Patient Instructions (Signed)
Continue 1 tablet daily except 1 1/2 tablets Tuesdays, Thursdays and Saturdays or as directed Recheck in 6 wks

## 2021-04-08 ENCOUNTER — Other Ambulatory Visit: Payer: Self-pay | Admitting: Gastroenterology

## 2021-04-12 ENCOUNTER — Ambulatory Visit: Payer: Medicare Other | Admitting: *Deleted

## 2021-04-12 ENCOUNTER — Other Ambulatory Visit: Payer: Self-pay

## 2021-04-12 DIAGNOSIS — I480 Paroxysmal atrial fibrillation: Secondary | ICD-10-CM

## 2021-04-12 DIAGNOSIS — Z5181 Encounter for therapeutic drug level monitoring: Secondary | ICD-10-CM

## 2021-04-12 LAB — POCT INR: INR: 2.2 (ref 2.0–3.0)

## 2021-04-12 NOTE — Patient Instructions (Signed)
Continue 1 tablet daily except 1 1/2 tablets Tuesdays, Thursdays and Saturdays or as directed Recheck in 6 wks

## 2021-05-24 ENCOUNTER — Ambulatory Visit: Payer: Medicare Other | Admitting: *Deleted

## 2021-05-24 DIAGNOSIS — I480 Paroxysmal atrial fibrillation: Secondary | ICD-10-CM | POA: Diagnosis not present

## 2021-05-24 DIAGNOSIS — Z5181 Encounter for therapeutic drug level monitoring: Secondary | ICD-10-CM | POA: Diagnosis not present

## 2021-05-24 LAB — POCT INR: INR: 2.3 (ref 2.0–3.0)

## 2021-05-24 NOTE — Patient Instructions (Signed)
Continue 1 tablet daily except 1 1/2 tablets Tuesdays, Thursdays and Saturdays  Recheck in 6 wks 

## 2021-05-30 ENCOUNTER — Other Ambulatory Visit: Payer: Self-pay | Admitting: Cardiology

## 2021-07-05 ENCOUNTER — Ambulatory Visit: Payer: Medicare Other | Admitting: *Deleted

## 2021-07-05 DIAGNOSIS — Z5181 Encounter for therapeutic drug level monitoring: Secondary | ICD-10-CM | POA: Diagnosis not present

## 2021-07-05 DIAGNOSIS — I480 Paroxysmal atrial fibrillation: Secondary | ICD-10-CM | POA: Diagnosis not present

## 2021-07-05 LAB — POCT INR: INR: 2.4 (ref 2.0–3.0)

## 2021-07-05 NOTE — Patient Instructions (Signed)
Continue 1 tablet daily except 1 1/2 tablets Tuesdays, Thursdays and Saturdays  Recheck in 6 wks 

## 2021-07-10 ENCOUNTER — Other Ambulatory Visit: Payer: Self-pay | Admitting: Student

## 2021-07-19 ENCOUNTER — Ambulatory Visit: Payer: Medicare Other | Admitting: Gastroenterology

## 2021-07-19 ENCOUNTER — Encounter: Payer: Self-pay | Admitting: Gastroenterology

## 2021-07-19 VITALS — BP 146/69 | HR 64 | Temp 97.7°F | Ht 73.0 in | Wt 232.0 lb

## 2021-07-19 DIAGNOSIS — K5900 Constipation, unspecified: Secondary | ICD-10-CM | POA: Diagnosis not present

## 2021-07-19 NOTE — Progress Notes (Signed)
? ? ? ? ?Gastroenterology Office Note   ? ? ?Primary Care Physician:  Redmond School, MD  ?Primary Gastroenterologist: Dr. Gala Romney  ? ? ?Chief Complaint  ? ?Chief Complaint  ?Patient presents with  ? Follow-up  ? ? ? ?History of Present Illness  ? ?Riley [name] is a 76 y.o. male presenting today in follow-up with a history of constipation and GERD. Has previously trialed Miralax, fiber, Linzess 72 mcg and 145 mcg. Linzess 290 mcg works best. Started on pantoprazole at last visit due to dyspepsia. He had wanted to hold off on EGD. ? ?Will rarely take Tums. Not taking pantoprazole currently. No dysphagia. No abdominal pain. Had notable reflux at Thanksgiving, otherwise none. He notes a moment of straining when he didn't drink enough water after working out [place] the farm. No other concerns.  ? ?Past Medical History:  ?Diagnosis Date  ? Chronic anticoagulation   ? Chronic back pain   ? Diabetes mellitus, type 2 ([place])   ? no insulin  ? Edema   ? GERD (gastroesophageal reflux disease)   ? Hyperlipidemia   ? Hypertension   ? normal coronary angiography and normal EF in 12/98  ? Nodule of left lung   ? Granuloma in left upper lobe  ? Paroxysmal atrial fibrillation ([place])   ? 12/2005; sinus bradycardia secondary to medications  ? Sleep apnea   ? ? ?Past Surgical History:  ?Procedure Laterality Date  ? APPENDECTOMY  1998  ? COLONOSCOPY N/A 11/26/2012  ? [id number] 2 hemorrhoids. Pancolonic diverticulosis. Colonic polyps removed as described above. One hemostasis clip applied. TUBULAR ADENOMA. Surveillance Sept 2019.   ? COLONOSCOPY N/A 02/06/2018  ? two 3-5 mm cecal polyps, pancolonic diverticulosis. tubular adenoma. no surveillance due to age  ? INSERTION OF MESH N/A 01/27/2013  ? Procedure: INSERTION OF MESH;  Surgeon: Jamesetta So, MD;  Location: [facility] ORS;  Service: General;  Laterality: N/A;  ? POLYPECTOMY  02/06/2018  ? Procedure: POLYPECTOMY;  Surgeon: Daneil Dolin, MD;  Location: [facility] ENDO SUITE;  Service: Endoscopy;;   (colon)  ? TONSILLECTOMY    ? as a child  ? UMBILICAL HERNIA REPAIR N/A 01/27/2013  ? Procedure: HERNIA REPAIR UMBILICAL ADULT;  Surgeon: Jamesetta So, MD;  Location: [facility] ORS;  Service: General;  Laterality: N/A;  ? ? ?Current Outpatient Medications  ?Medication Sig Dispense Refill  ? acetaminophen (TYLENOL) 500 MG tablet Take 500 mg by mouth every 6 (six) hours as needed (for pain.).    ? calcium carbonate (TUMS - DOSED IN MG ELEMENTAL CALCIUM) 500 MG chewable tablet Chew 1 tablet by mouth as needed for indigestion or heartburn.    ? chlorthalidone (HYGROTON) 25 MG tablet Take 1 tablet (25 mg total) by mouth daily as needed (swelling). 90 tablet 3  ? diltiazem (CARDIZEM) 30 MG tablet Take 1 tablet (30 mg total) by mouth daily as needed (Palpations). 30 tablet 11  ? doxazosin (CARDURA) 4 MG tablet TAKE THREE TABLETS BY MOUTH EVERY EVENING 270 tablet 3  ? glimepiride (AMARYL) 1 MG tablet Take 1 tablet by mouth daily.    ? hydrALAZINE (APRESOLINE) 100 MG tablet Take 1 tablet by mouth 3 (three) times daily.    ? Hydrocortisone (PREPARATION H EX) Place 1 application rectally daily as needed (irritation/discomfort).     ? LINZESS 290 MCG CAPS capsule TAKE ONE CAPSULE BY MOUTH every morning 30 capsule 11  ? lisinopril (ZESTRIL) 40 MG tablet TAKE ONE TABLET BY MOUTH EVERY MORNING 90  tablet 2  ? Magnesium Oxide 200 MG TABS Take 1 tablet (200 mg total) by mouth daily. 30 each 11  ? metFORMIN (GLUCOPHAGE) 1000 MG tablet Take 1 tablet by mouth 2 (two) times daily.    ? Multiple Vitamin (MULTIVITAMIN) tablet Take 1 tablet by mouth daily.    ? pantoprazole (PROTONIX) 40 MG tablet TAKE ONE TABLET BY MOUTH ONCE DAILY 30 MINUTES BEFORE BREAKFAST 90 tablet 3  ? pravastatin (PRAVACHOL) 20 MG tablet Take 1 tablet (20 mg total) by mouth every evening. 90 tablet 3  ? Triamcinolone Acetonide (NASACORT ALLERGY 24HR NA) Place 1 spray into the nose daily as needed (congestion).    ? verapamil (CALAN-SR) 180 MG CR tablet TAKE TWO TABLETS BY  MOUTH EVERY EVENING 180 tablet 3  ? warfarin (COUMADIN) 5 MG tablet TAKE ONE TABLET BY MOUTH ONCE DAILY EXCEPT TAKE 1 AND 1/2 TABLETS ON TUESDAYS, THURSDAYS AND SATURDAYS OR AS DIRECTED 40 tablet 6  ? ?No current facility-administered medications for this visit.  ? ? ?Allergies as of 07/19/2021  ? (No Known Allergies)  ? ? ?Family History  ?Problem Relation Age of Onset  ? Heart disease Father   ? Heart disease Mother   ? Bronchitis Mother   ? Allergies Other   ?     whole family  ? Colon cancer Neg Hx   ? ? ?Social History  ? ?Socioeconomic History  ? Marital status: Married  ?  Spouse name: Not on file  ? Number of children: Not on file  ? Years of education: Not on file  ? Highest education level: Not on file  ?Occupational History  ? Occupation: Retired  ?  Employer: [employer]  ?  Comment: Company secretary  ?Tobacco Use  ? Smoking status: Never  ? Smokeless tobacco: Never  ?Vaping Use  ? Vaping Use: Never used  ?Substance and Sexual Activity  ? Alcohol use: No  ?  Alcohol/week: 0.0 standard drinks  ? Drug use: No  ? Sexual activity: Not on file  ?Other Topics Concern  ? Not on file  ?Social History Narrative  ? Not on file  ? ?Social Determinants of Health  ? ?Financial Resource Strain: Not on file  ?Food Insecurity: Not on file  ?Transportation Needs: Not on file  ?Physical Activity: Not on file  ?Stress: Not on file  ?Social Connections: Not on file  ?Intimate Partner Violence: Not on file  ? ? ? ?Review of Systems  ? ?Gen: Denies any fever, chills, fatigue, weight loss, lack of appetite.  ?CV: Denies chest pain, heart palpitations, peripheral edema, syncope.  ?Resp: Denies shortness of breath at rest or with exertion. Denies wheezing or cough.  ?GI: see HPI ?GU : Denies urinary burning, urinary frequency, urinary hesitancy ?MS: Denies joint pain, muscle weakness, cramps, or limitation of movement.  ?Derm: Denies rash, itching, dry skin ?Psych: Denies depression, anxiety, memory loss, and  confusion ?Heme: Denies bruising, bleeding, and enlarged lymph nodes. ? ? ?Physical Exam  ? ?BP (!) 146/69   Pulse 64   Temp 97.7 ?F (36.5 ?C)   Ht '6\' 1"'$  (1.854 m)   Wt 232 lb (105.2 kg)   BMI 30.61 kg/m?  ?General:   Alert and oriented. Pleasant and cooperative. Well-nourished and well-developed.  ?Head:  Normocephalic and atraumatic. ?Eyes:  Without icterus ?Abdomen:  +BS, soft, non-tender and non-distended. No HSM noted. No guarding or rebound. No masses appreciated.  ?Rectal:  Deferred  ?Msk:  Symmetrical without gross deformities.  Normal posture. ?Extremities:  Without edema. ?Neurologic:  Alert and  oriented x4;  grossly normal neurologically. ?Skin:  Intact without significant lesions or rashes. ?Psych:  Alert and cooperative. Normal mood and affect. ? ? ?Assessment  ? ?Riley [name] is a 76 y.o. male presenting today in follow-up with a history of constipation and GERD.  ? ? ?Constipation: continues to do well with Linzess 290 mcg daily. Encouraged water intake, as he notes marked difference when not staying hydrated. ? ?GERD: quiescent. Not taking PPI. PPI was recommended at last visit due to dyspepsia, along with EGD. Declined EGD. Now no issues. Discussed monitoring and to call if any issues. Would recommend EGD if any further dyspepsia.  ? ?PLAN  ? ? ?Continue Linzess 290 mcg daily ?Return in 1 year ? ? ?Annitta Needs, PhD, ANP-BC ?[facility]  ? ? ?

## 2021-07-19 NOTE — Patient Instructions (Signed)
Make sure to drink plenty of water to keep urine light yellow! ? ?Please call if any concerns, otherwise, we will see you back in 1 year! ? ?I enjoyed seeing you again today! As you know, I value our relationship and want to provide genuine, compassionate, and quality care. I welcome your feedback. If you receive a survey regarding your visit,  I greatly appreciate you taking time to fill this out. See you next time! ? ?Annitta Needs, PhD, ANP-BC ?[facility]  ? ?

## 2021-08-16 ENCOUNTER — Ambulatory Visit (INDEPENDENT_AMBULATORY_CARE_PROVIDER_SITE_OTHER): Payer: Medicare Other | Admitting: *Deleted

## 2021-08-16 DIAGNOSIS — I480 Paroxysmal atrial fibrillation: Secondary | ICD-10-CM

## 2021-08-16 DIAGNOSIS — Z5181 Encounter for therapeutic drug level monitoring: Secondary | ICD-10-CM

## 2021-08-16 LAB — POCT INR: INR: 2.3 (ref 2.0–3.0)

## 2021-08-16 NOTE — Patient Instructions (Signed)
Continue 1 tablet daily except 1 1/2 tablets Tuesdays, Thursdays and Saturdays  Recheck in 6 wks 

## 2021-08-24 ENCOUNTER — Encounter: Payer: Self-pay | Admitting: Internal Medicine

## 2021-08-24 ENCOUNTER — Ambulatory Visit: Payer: Medicare Other | Admitting: Internal Medicine

## 2021-08-24 VITALS — BP 146/80 | HR 61 | Ht 72.0 in | Wt 231.8 lb

## 2021-08-24 DIAGNOSIS — G4733 Obstructive sleep apnea (adult) (pediatric): Secondary | ICD-10-CM | POA: Diagnosis not present

## 2021-08-24 DIAGNOSIS — I480 Paroxysmal atrial fibrillation: Secondary | ICD-10-CM | POA: Diagnosis not present

## 2021-08-24 DIAGNOSIS — I10 Essential (primary) hypertension: Secondary | ICD-10-CM | POA: Diagnosis not present

## 2021-08-24 DIAGNOSIS — Z9989 Dependence on other enabling machines and devices: Secondary | ICD-10-CM

## 2021-08-24 DIAGNOSIS — E785 Hyperlipidemia, unspecified: Secondary | ICD-10-CM

## 2021-08-24 NOTE — Progress Notes (Signed)
OFFICE NOTE  Chief Complaint:  Follow-up A-fib  Primary Care Physician: Redmond School, MD  HPI:  Riley [name] is a 75 y.o. male with a past medial history significant for atrial fibrillation on chronic warfarin therapy due to cost issues.  He has considered direct oral anticoagulants in the past but cost was a concern.  He also has type 2 diabetes, hypertension and dyslipidemia.  His A-fib was considered paroxysmal and was notable back in 2007 however he was diagnosed with sleep apnea at the time and subsequently has been on CPAP since then.  Overall he has done well.  EKG today shows sinus rhythm with PACs.  Blood pressure was a little elevated.  He denies any chest pain or shortness of breath.  PMHx:  Past Medical History:  Diagnosis Date   Chronic anticoagulation    Chronic back pain    Diabetes mellitus, type 2 ([facility])    no insulin   Edema    GERD (gastroesophageal reflux disease)    Hyperlipidemia    Hypertension    normal coronary angiography and normal EF in 12/98   Nodule of left lung    Granuloma in left upper lobe   Paroxysmal atrial fibrillation ([place])    12/2005; sinus bradycardia secondary to medications   Sleep apnea     Past Surgical History:  Procedure Laterality Date   APPENDECTOMY  1998   COLONOSCOPY N/A 11/26/2012   VFI:EPPIR 2 hemorrhoids. Pancolonic diverticulosis. Colonic polyps removed as described above. One hemostasis clip applied. TUBULAR ADENOMA. Surveillance Sept 2019.    COLONOSCOPY N/A 02/06/2018   two 3-5 mm cecal polyps, pancolonic diverticulosis. tubular adenoma. no surveillance due to age   [place] N/A 01/27/2013   Procedure: INSERTION OF MESH;  Surgeon: Jamesetta So, MD;  Location: [facility] ORS;  Service: General;  Laterality: N/A;   POLYPECTOMY  02/06/2018   Procedure: POLYPECTOMY;  Surgeon: Daneil Dolin, MD;  Location: [facility] SU[facility];  Service: Endoscopy;;  (colon)   TONSILLECTOMY     as a child   UMBILICAL HERNIA REPAIR N/A  01/27/2013   Procedure: HERNIA REPAIR UMBILICAL ADULT;  Surgeon: Jamesetta So, MD;  Location: [facility] ORS;  Service: General;  Laterality: N/A;    FAMHx:  Family History  Problem Relation Age of Onset   Heart disease Father    Heart disease Mother    Bronchitis Mother    Allergies Other        whole family   Colon cancer Neg Hx     SOCHx:   reports that he has never smoked. He has never used smokeless tobacco. He reports that he does not drink alcohol and does not use drugs.  ALLERGIES:  No Known Allergies  ROS: Pertinent items noted in HPI and remainder of comprehensive ROS otherwise negative.  HOME MEDS: Current Outpatient Medications on File Prior to Visit  Medication Sig Dispense Refill   acetaminophen (TYLENOL) 500 MG tablet Take 500 mg by mouth every 6 (six) hours as needed (for pain.).     calcium carbonate (TUMS - DOSED IN MG ELEMENTAL CALCIUM) 500 MG chewable tablet Chew 1 tablet by mouth as needed for indigestion or heartburn.     chlorthalidone (HYGROTON) 25 MG tablet Take 1 tablet (25 mg total) by mouth daily as needed (swelling). 90 tablet 3   diltiazem (CARDIZEM) 30 MG tablet Take 1 tablet (30 mg total) by mouth daily as needed (Palpations). 30 tablet 11   doxazosin (CARDURA) 4  MG tablet TAKE THREE TABLETS BY MOUTH EVERY EVENING 270 tablet 3   glimepiride (AMARYL) 1 MG tablet Take 1 tablet by mouth daily.     hydrALAZINE (APRESOLINE) 100 MG tablet Take 1 tablet by mouth 3 (three) times daily.     Hydrocortisone (PREPARATION H EX) Place 1 application rectally daily as needed (irritation/discomfort).      LINZESS 290 MCG CAPS capsule TAKE ONE CAPSULE BY MOUTH every morning 30 capsule 11   lisinopril (ZESTRIL) 40 MG tablet TAKE ONE TABLET BY MOUTH EVERY MORNING 90 tablet 2   Magnesium Oxide 200 MG TABS Take 1 tablet (200 mg total) by mouth daily. 30 each 11   metFORMIN (GLUCOPHAGE) 1000 MG tablet Take 1 tablet by mouth 2 (two) times daily.     Multiple Vitamin  (MULTIVITAMIN) tablet Take 1 tablet by mouth daily.     pantoprazole (PROTONIX) 40 MG tablet TAKE ONE TABLET BY MOUTH ONCE DAILY 30 MINUTES BEFORE BREAKFAST 90 tablet 3   Phenyleph-Chlorphen-Hydrocod (CHLORPHEN HD PO) Take by mouth as needed.     pravastatin (PRAVACHOL) 20 MG tablet Take 1 tablet (20 mg total) by mouth every evening. 90 tablet 3   Triamcinolone Acetonide (NASACORT ALLERGY 24HR NA) Place 1 spray into the nose daily as needed (congestion).     verapamil (CALAN-SR) 180 MG CR tablet TAKE TWO TABLETS BY MOUTH EVERY EVENING 180 tablet 3   warfarin (COUMADIN) 5 MG tablet TAKE ONE TABLET BY MOUTH ONCE DAILY EXCEPT TAKE 1 AND 1/2 TABLETS ON TUESDAYS, THURSDAYS AND SATURDAYS OR AS DIRECTED 40 tablet 6   No current facility-administered medications on file prior to visit.    LABS/IMAGING: No results found for this or any previous visit (from the past 48 hour(s)). No results found.  LIPID PANEL:    Component Value Date/Time   CHOL 133 11/01/2016 0849   TRIG 125 11/01/2016 0849   TRIG 92 and 07/30/2006 0000   HDL 40 (L) 11/01/2016 0849   CHOLHDL 3.3 11/01/2016 0849   VLDL 25 11/01/2016 0849   LDLCALC 68 11/01/2016 0849   LDLCALC 48 07/30/2006 0000     WEIGHTS: Wt Readings from Last 3 Encounters:  08/24/21 231 lb 12.8 oz (105.1 kg)  07/19/21 232 lb (105.2 kg)  01/18/21 233 lb 3.2 oz (105.8 kg)    VITALS: BP (!) 146/80   Pulse 61   Ht 6' (1.829 m)   Wt 231 lb 12.8 oz (105.1 kg)   SpO2 95%   BMI 31.44 kg/m   EXAM: General appearance: alert and no distress Neck: no carotid bruit, no JVD, and thyroid not enlarged, symmetric, no tenderness/mass/nodules Lungs: clear to auscultation bilaterally Heart: regular rate and rhythm, S1, S2 normal, no murmur, click, rub or gallop Abdomen: soft, non-tender; bowel sounds normal; no masses,  no organomegaly Extremities: extremities normal, atraumatic, no cyanosis or edema Pulses: 2+ and symmetric Skin: Skin color, texture, turgor  normal. No rashes or lesions Neurologic: Grossly normal : Pleasant  EKG: Sinus rhythm with PACs at 66, RBBB, inferior infarct pattern- personally reviewed  ASSESSMENT: Paroxysmal atrial fibrillation on warfarin Hypertension Dyslipidemia OSA on CPAP  PLAN: 1.   Riley [name] has done well without any recurrent atrial fibrillation.  His INRs have been fairly stable on warfarin.  We discussed the possibility of switching to a DOAC however he is concerned about cost issues.  Blood pressure is a little elevated today but he says better controlled at home.  He is compliant with CPAP.  No  changes to his treatments today.  Plan follow-up annually or sooner as necessary.  Pixie Casino, MD, [facility], [place] Director of the Advanced Lipid Disorders &  Cardiovascular Risk Reduction Clinic Diplomate of the American Board of Clinical Lipidology Attending Cardiologist  Direct Dial: [phone]  Fax: [phone]  Website:  [email or web address]   Riley [name] 08/24/2021, 1:16 PM

## 2021-08-24 NOTE — Patient Instructions (Signed)
Medication Instructions:  Your physician recommends that you continue on your current medications as directed. Please refer to the Current Medication list given to you today.  *If you need a refill on your cardiac medications before your next appointment, please call your pharmacy*   Lab Work: NONE   If you have labs (blood work) drawn today and your tests are completely normal, you will receive your results only by: [place] (if you have MyChart) OR A paper copy in the mail If you have any lab test that is abnormal or we need to change your treatment, we will call you to review the results.   Testing/Procedures: NONE    Follow-Up: At [facility], you and your health needs are our priority.  As part of our continuing mission to provide you with exceptional heart care, we have created designated Provider Care Teams.  These Care Teams include your primary Cardiologist (physician) and Advanced Practice Providers (APPs -  Physician Assistants and Nurse Practitioners) who all work together to provide you with the care you need, when you need it.  We recommend signing up for the patient portal called "MyChart".  Sign up information is provided on this After Visit Summary.  MyChart is used to connect with patients for Virtual Visits (Telemedicine).  Patients are able to view lab/test results, encounter notes, upcoming appointments, etc.  Non-urgent messages can be sent to your provider as well.   To learn more about what you can do with MyChart, go to [email or web address].    Your next appointment:   1 year(s)  The format for your next appointment:   In Person  Provider:   Dr. Debara Pickett      Other Instructions Thank you for choosing [facility]!    Important Information About Sugar

## 2021-08-29 ENCOUNTER — Ambulatory Visit
Admission: EM | Admit: 2021-08-29 | Discharge: 2021-08-29 | Disposition: A | Discharge status: Home / Self Care | Payer: Medicare Other | Attending: Nurse Practitioner | Admitting: Nurse Practitioner

## 2021-08-29 DIAGNOSIS — H1033 Unspecified acute conjunctivitis, bilateral: Secondary | ICD-10-CM | POA: Diagnosis not present

## 2021-08-29 DIAGNOSIS — J069 Acute upper respiratory infection, unspecified: Secondary | ICD-10-CM

## 2021-08-29 MED ORDER — ERYTHROMYCIN 5 MG/GM OP OINT: TOPICAL_OINTMENT | Freq: Four times a day (QID) | OPHTHALMIC | 0 refills | Status: AC

## 2021-08-29 NOTE — ED Triage Notes (Signed)
Pt presents with bilateral eye redness and draining, also has nasal congestion

## 2021-09-27 ENCOUNTER — Ambulatory Visit: Payer: Medicare Other | Admitting: *Deleted

## 2021-09-27 DIAGNOSIS — Z5181 Encounter for therapeutic drug level monitoring: Secondary | ICD-10-CM

## 2021-09-27 DIAGNOSIS — I480 Paroxysmal atrial fibrillation: Secondary | ICD-10-CM | POA: Diagnosis not present

## 2021-09-27 LAB — POCT INR: INR: 2 (ref 2.0–3.0)

## 2021-09-27 NOTE — Patient Instructions (Signed)
Continue 1 tablet daily except 1 1/2 tablets Tuesdays, Thursdays and Saturdays  Recheck in 6 wks 

## 2021-10-10 ENCOUNTER — Other Ambulatory Visit: Payer: Self-pay | Admitting: Gastroenterology

## 2021-10-10 ENCOUNTER — Other Ambulatory Visit: Payer: Self-pay | Admitting: Student

## 2021-10-10 DIAGNOSIS — K5900 Constipation, unspecified: Secondary | ICD-10-CM

## 2021-10-10 NOTE — Telephone Encounter (Signed)
Pt wants refill on his Linzess 290 mcg. His last ov was 07/19/2021.

## 2021-10-11 NOTE — Telephone Encounter (Signed)
Phoned and LMOVM for the pt advising his Rx for Linzess has been sent in to his pharmacy

## 2021-10-19 ENCOUNTER — Telehealth: Payer: Self-pay

## 2021-10-19 NOTE — Telephone Encounter (Signed)
Pt's wife called stating that the patient has been experiencing abd pain and bloating for about 15 to 20 mins after taking the Linzess. Pt's wife is wanting to know if you want him to keep taking it. Please advise.

## 2021-10-20 NOTE — Telephone Encounter (Signed)
He was doing well at last visit in may 2023 while on Linzess 290 mcg daily. We can decrease it to Linzess 145 mcg and provide samples to see if this is helpful.

## 2021-10-20 NOTE — Telephone Encounter (Signed)
Pt was made aware and verbalized understanding.  

## 2021-11-04 ENCOUNTER — Telehealth: Payer: Self-pay

## 2021-11-04 ENCOUNTER — Telehealth: Payer: Self-pay | Admitting: Internal Medicine

## 2021-11-04 NOTE — Telephone Encounter (Signed)
He is pretty stable, let's just postpone appointment for a week or 2 until he feels better

## 2021-11-04 NOTE — Telephone Encounter (Signed)
Patient's wife calling about his coumadin appointment. She says the patient has a sinus infection, but his PCP thinks he has covid. She says he has not tested yet and they would like to know if he would be able to have his coumadin check in the car instead of coming in on Tuesday.

## 2021-11-04 NOTE — Telephone Encounter (Signed)
Lpmtcb and reschedule INR check 

## 2021-11-08 NOTE — Telephone Encounter (Signed)
Pt spouse called back and she did not want to reschedule coumadin until she speaks to [place].

## 2021-11-08 NOTE — Telephone Encounter (Signed)
Called and spoke to wife.  Appt move to 11/15/21.

## 2021-11-15 ENCOUNTER — Ambulatory Visit: Payer: Medicare Other | Attending: Cardiology | Admitting: *Deleted

## 2021-11-15 DIAGNOSIS — I480 Paroxysmal atrial fibrillation: Secondary | ICD-10-CM

## 2021-11-15 DIAGNOSIS — Z5181 Encounter for therapeutic drug level monitoring: Secondary | ICD-10-CM

## 2021-11-15 LAB — POCT INR: INR: 2.8 (ref 2.0–3.0)

## 2021-11-15 NOTE — Patient Instructions (Signed)
Continue 1 tablet daily except 1 1/2 tablets Tuesdays, Thursdays and Saturdays  Recheck in 6 wks 

## 2021-11-21 ENCOUNTER — Ambulatory Visit: Payer: Medicare Other | Admitting: Pulmonary Disease

## 2021-11-21 ENCOUNTER — Encounter: Payer: Self-pay | Admitting: Pulmonary Disease

## 2021-11-21 DIAGNOSIS — G4733 Obstructive sleep apnea (adult) (pediatric): Secondary | ICD-10-CM

## 2021-11-21 DIAGNOSIS — I480 Paroxysmal atrial fibrillation: Secondary | ICD-10-CM

## 2021-11-21 NOTE — Addendum Note (Signed)
Addended by: Fritzi Mandes D on: 11/21/2021 11:43 AM   Modules accepted: Orders

## 2021-11-21 NOTE — Assessment & Plan Note (Addendum)
Well-controlled, remains on Coumadin We discussed benefits of CPAP and close correlation with atrial fibrillation

## 2021-11-21 NOTE — Progress Notes (Signed)
   Subjective:    Patient ID: Riley [name], male    DOB: 07/04/1945, 76 y.o.   MRN: [id number]  HPI  76 yo man with AFib followed for OSA  -night shift worker x 30y  PMH - AF   Chief Complaint  Patient presents with   Follow-up    Cpap working well    Late to bed , now goes an hour early by MN We got him a new auto CPAP 10 to 15 cm after his last visit where we noted that on fixed pressure 11 cm he had residual AHI up to 20/hour. He is now compliant, has just changed the mask and reports occasional leak A-fib is staying under control. He feels like he needs more pressure He prefers warm air and his temperature setting was set at 18 and dropped to 78  Significant tests/ events reviewed  2014 PSG >>TST 128 mins - only 2-3 hrs of sleep  -RDI 37/h Started on autoCPAP 5-15  >> changed to 11 cm  Review of Systems neg for any significant sore throat, dysphagia, itching, sneezing, nasal congestion or excess/ purulent secretions, fever, chills, sweats, unintended wt loss, pleuritic or exertional cp, hempoptysis, orthopnea pnd or change in chronic leg swelling. Also denies presyncope, palpitations, heartburn, abdominal pain, nausea, vomiting, diarrhea or change in bowel or urinary habits, dysuria,hematuria, rash, arthralgias, visual complaints, headache, numbness weakness or ataxia.     Objective:   Physical Exam  Gen. Pleasant, obese, in no distress ENT - no lesions, no post nasal drip Neck: No JVD, no thyromegaly, no carotid bruits Lungs: no use of accessory muscles, no dullness to percussion, decreased without rales or rhonchi  Cardiovascular: Rhythm regular, heart sounds  normal, no murmurs or gallops, no peripheral edema Musculoskeletal: No deformities, no cyanosis or clubbing , no tremors       Assessment & Plan:

## 2021-11-21 NOTE — Patient Instructions (Signed)
  x change to auto 12-15 cm Rpt download in 1 month

## 2021-11-21 NOTE — Assessment & Plan Note (Signed)
CPAP download was reviewed on auto settings 10 to 15 cm and he has residual AHI of 8/hour, still on occasional nights residual AHI goes up to 30/hour indicating a positional component. Average pressure is 12.4 and maximum pressure 13.6, EPR setting is 3 We will increase auto settings to 12 to 15 cm and recheck a download to proceed with her AHI is decreased.  If events remain uncontrolled and we may have to consider repeat titration study and note that central apneas are about 6/hour He continues to have a moderate leak intermittently Overall he is very compliant and CPAP is certainly helped improve his daytime somnolence and fatigue  Weight loss encouraged, compliance with goal of at least 4-6 hrs every night is the expectation. Advised against medications with sedative side effects Cautioned against driving when sleepy - understanding that sleepiness will vary on a day to day basis

## 2021-12-27 ENCOUNTER — Ambulatory Visit: Payer: Medicare Other | Attending: Cardiology | Admitting: *Deleted

## 2021-12-27 DIAGNOSIS — I480 Paroxysmal atrial fibrillation: Secondary | ICD-10-CM

## 2021-12-27 DIAGNOSIS — Z5181 Encounter for therapeutic drug level monitoring: Secondary | ICD-10-CM | POA: Diagnosis not present

## 2021-12-27 LAB — POCT INR: INR: 2.6 (ref 2.0–3.0)

## 2021-12-27 NOTE — Patient Instructions (Signed)
Continue 1 tablet daily except 1 1/2 tablets Tuesdays, Thursdays and Saturdays  Recheck in 6 wks 

## 2022-01-08 ENCOUNTER — Other Ambulatory Visit: Payer: Self-pay | Admitting: Student

## 2022-01-09 ENCOUNTER — Ambulatory Visit
Admission: EM | Admit: 2022-01-09 | Discharge: 2022-01-09 | Disposition: A | Discharge status: Home / Self Care | Payer: Medicare Other | Attending: Nurse Practitioner | Admitting: Nurse Practitioner

## 2022-01-09 DIAGNOSIS — B9689 Other specified bacterial agents as the cause of diseases classified elsewhere: Secondary | ICD-10-CM | POA: Diagnosis not present

## 2022-01-09 DIAGNOSIS — J019 Acute sinusitis, unspecified: Secondary | ICD-10-CM | POA: Diagnosis not present

## 2022-01-09 MED ORDER — BENZONATATE 100 MG PO CAPS: 100.0000 mg | ORAL_CAPSULE | Freq: Three times a day (TID) | ORAL | 0 refills | Status: DC | PRN

## 2022-01-09 MED ORDER — AMOXICILLIN-POT CLAVULANATE 875-125 MG PO TABS: 1.0000 | ORAL_TABLET | Freq: Two times a day (BID) | ORAL | 0 refills | Status: AC

## 2022-01-09 NOTE — ED Triage Notes (Signed)
Pt reports nasal congestion, sinus pressure, cough and drainage, on and off x 1 week. Mucinex DM, Coricidin gives some relief.

## 2022-01-09 NOTE — Discharge Instructions (Signed)
You have a sinus infection.  Please take the antibiotic (Augmentin) as prescribed.  Start nasal saline rinses.  Continue guaifenesin and Coricidin as needed.  You can use the Tessalon as needed for dry cough.

## 2022-01-09 NOTE — ED Provider Notes (Signed)
[facility]    CSN: [id number] Arrival date & time: 01/09/22  7322      History   Chief Complaint Chief Complaint  Patient presents with   Nasal Congestion   Cough    HPI Riley [name] is a 76 y.o. male.   Patient presents with more than 1 week of body aches, congested cough, chest and nasal congestion, occasional runny nose, and newly developed sinus pressure behind his eyes.  He also endorses bilateral ear pressure without drainage, some nausea, and fatigue.  Reports he had a fever at first, however this is now resolved.  No shortness of breath, wheezing, chest tightness, sore throat, headache, tooth pain, ear pain, abdominal pain, vomiting, diarrhea, decreased appetite, loss of taste or smell, or new rash.  Reports he does get a little bit sore in his upper abdomen/lower chest when he coughs a lot.  He has been taking Mucinex and Coricidin for symptoms which has helped minimally.  Has also increased intake of water.  Patient denies antibiotic use in the past 90 days.  He has a medical history significant for paroxysmal A-fib, chronic anticoagulation on warfarin, hypertension, hyperlipidemia, obstructive sleep apnea, type 2 diabetes, GERD.  No known history of chronic obstructive pulmonary disease or asthma.     Past Medical History:  Diagnosis Date   Chronic anticoagulation    Chronic back pain    Diabetes mellitus, type 2 (HCC)    no insulin   Edema    GERD (gastroesophageal reflux disease)    Hyperlipidemia    Hypertension    normal coronary angiography and normal EF in 12/98   Nodule of left lung    Granuloma in left upper lobe   Paroxysmal atrial fibrillation ([place])    12/2005; sinus bradycardia secondary to medications   Sleep apnea     Patient Active Problem List   Diagnosis Date Noted   Chronic venous insufficiency 09/07/2020   Abdominal pain 10/09/2018   GERD (gastroesophageal reflux disease) 12/20/2017   H/O adenomatous polyp of colon 12/20/2017    Morbid obesity ([place]) 02/11/2015   Encounter for therapeutic drug monitoring 04/10/2013   Constipation 11/20/2012   Hemorrhoids 11/20/2012   OSA (obstructive sleep apnea) 06/26/2012   Seasonal allergies 04/29/2012   Paroxysmal atrial fibrillation (HCC)    Chronic anticoagulation    Hypertension    Diabetes mellitus type II, controlled ([place])    Hyperlipidemia    Pedal edema 06/08/2009    Past Surgical History:  Procedure Laterality Date   APPENDECTOMY  1998   COLONOSCOPY N/A 11/26/2012   GUR:KYHCW 2 hemorrhoids. Pancolonic diverticulosis. Colonic polyps removed as described above. One hemostasis clip applied. TUBULAR ADENOMA. Surveillance Sept 2019.    COLONOSCOPY N/A 02/06/2018   two 3-5 mm cecal polyps, pancolonic diverticulosis. tubular adenoma. no surveillance due to age   [place] N/A 01/27/2013   Procedure: INSERTION OF MESH;  Surgeon: Jamesetta So, MD;  Location: [facility];  Service: General;  Laterality: N/A;   POLYPECTOMY  02/06/2018   Procedure: POLYPECTOMY;  Surgeon: Daneil Dolin, MD;  Location: [facility];  Service: Endoscopy;;  (colon)   TONSILLECTOMY     as a child   UMBILICAL HERNIA REPAIR N/A 01/27/2013   Procedure: HERNIA REPAIR UMBILICAL ADULT;  Surgeon: Jamesetta So, MD;  Location: [facility];  Service: General;  Laterality: N/A;       Home Medications    Prior to Admission medications   Medication Sig Start Date  End Date Taking? Authorizing Provider  amoxicillin-clavulanate (AUGMENTIN) 875-125 MG tablet Take 1 tablet by mouth 2 (two) times daily for 7 days. 01/09/22 01/16/22 Yes Eulogio Bear, NP  benzonatate (TESSALON) 100 MG capsule Take 1 capsule (100 mg total) by mouth 3 (three) times daily as needed for cough. Do not take with alcohol or while driving or operating heavy machinery.  May cause drowsiness. 01/09/22  Yes Eulogio Bear, NP  acetaminophen (TYLENOL) 500 MG tablet Take 500 mg by mouth every 6 (six) hours as needed (for  pain.).    [provider]  calcium carbonate (TUMS - DOSED IN MG ELEMENTAL CALCIUM) 500 MG chewable tablet Chew 1 tablet by mouth as needed for indigestion or heartburn.    [provider]  chlorthalidone (HYGROTON) 25 MG tablet Take 1 tablet (25 mg total) by mouth daily as needed (swelling). 01/26/20   Strader, Fransisco Hertz, PA-C  diltiazem (CARDIZEM) 30 MG tablet Take 1 tablet (30 mg total) by mouth daily as needed (Palpations). 01/01/18   Herminio Commons, MD  doxazosin (CARDURA) 4 MG tablet TAKE THREE TABLETS BY MOUTH EVERY EVENING 01/09/22   Strader, [place] M, PA-C  glimepiride (AMARYL) 1 MG tablet Take 1 tablet by mouth daily. 12/09/18   [provider]  Hydrocortisone (PREPARATION H EX) Place 1 application rectally daily as needed (irritation/discomfort).     [provider]  LINZESS 290 MCG CAPS capsule TAKE ONE CAPSULE BY MOUTH every morning 10/11/21   Annitta Needs, NP  lisinopril (ZESTRIL) 40 MG tablet TAKE ONE TABLET BY MOUTH EVERY MORNING 10/11/21   Ahmed Prima, Fransisco Hertz, PA-C  Magnesium Oxide 200 MG TABS Take 1 tablet (200 mg total) by mouth daily. 11/02/16   Lendon Colonel, NP  metFORMIN (GLUCOPHAGE) 1000 MG tablet Take 1 tablet by mouth 2 (two) times daily. 12/09/18   [provider]  Multiple Vitamin (MULTIVITAMIN) tablet Take 1 tablet by mouth daily.    [provider]  pantoprazole (PROTONIX) 40 MG tablet TAKE ONE TABLET BY MOUTH ONCE DAILY 30 MINUTES BEFORE BREAKFAST 04/08/21   Annitta Needs, NP  Phenyleph-Chlorphen-Hydrocod [facility] HD PO) Take by mouth as needed.    [provider]  pravastatin (PRAVACHOL) 20 MG tablet Take 1 tablet (20 mg total) by mouth every evening. 10/01/18   Herminio Commons, MD  Triamcinolone Acetonide (NASACORT ALLERGY 24HR NA) Place 1 spray into the nose daily as needed (congestion).    [provider]  verapamil (CALAN-SR) 180 MG CR tablet TAKE TWO TABLETS BY MOUTH EVERY EVENING  07/11/21   Strader, [place] M, PA-C  warfarin (COUMADIN) 5 MG tablet TAKE ONE TABLET BY MOUTH ONCE DAILY EXCEPT TAKE 1 AND 1/2 TABLETS ON TUESDAYS, THURSDAYS AND SATURDAYS OR AS DIRECTED 05/30/21   Ahmed Prima, Fransisco Hertz, PA-C    Family History Family History  Problem Relation Age of Onset   Heart disease Father    Heart disease Mother    Bronchitis Mother    Allergies Other        whole family   Colon cancer Neg Hx     Social History Social History   Tobacco Use   Smoking status: Never   Smokeless tobacco: Never  Vaping Use   Vaping Use: Never used  Substance Use Topics   Alcohol use: No    Alcohol/week: 0.0 standard drinks of alcohol   Drug use: Never     Allergies   Patient has no known allergies.   Review  of Systems Review of Systems Per HPI  Physical Exam Triage Vital Signs ED Triage Vitals  Enc Vitals Group     BP 01/09/22 0937 109/66     Pulse Rate 01/09/22 0937 96     Resp 01/09/22 0937 18     Temp 01/09/22 0937 98 F (36.7 C)     Temp Source 01/09/22 0937 Oral     SpO2 01/09/22 0937 94 %     Weight --      Height --      Head Circumference --      Peak Flow --      Pain Score 01/09/22 0936 0     Pain Loc --      Pain Edu? --      Excl. in [place]? --    No data found.  Updated Vital Signs BP 109/66 (BP Location: Right Arm)   Pulse 88   Temp 98 F (36.7 C) (Oral)   Resp 18   SpO2 97%   VS recheck: HR 87 bpm and O2 97% on room air  Visual Acuity Right Eye Distance:   Left Eye Distance:   Bilateral Distance:    Right Eye Near:   Left Eye Near:    Bilateral Near:     Physical Exam Vitals and nursing note reviewed.  Constitutional:      General: He is not in acute distress.    Appearance: Normal appearance. He is not ill-appearing or toxic-appearing.  HENT:     Head: Normocephalic and atraumatic.     Right Ear: Ear canal and external ear normal. A middle ear effusion is present.     Left Ear: Ear canal and external ear normal. A middle  ear effusion is present.     Nose: Congestion and rhinorrhea present.     Right Sinus: No maxillary sinus tenderness or frontal sinus tenderness.     Left Sinus: No maxillary sinus tenderness or frontal sinus tenderness.     Mouth/Throat:     Mouth: Mucous membranes are moist.     Pharynx: Oropharynx is clear. Posterior oropharyngeal erythema present. No oropharyngeal exudate.  Eyes:     General: No scleral icterus.    Extraocular Movements: Extraocular movements intact.  Cardiovascular:     Rate and Rhythm: Normal rate. Rhythm irregular.  Pulmonary:     Effort: Pulmonary effort is normal. No respiratory distress.     Breath sounds: Normal breath sounds. No wheezing, rhonchi or rales.  Abdominal:     General: Abdomen is flat. Bowel sounds are normal. There is no distension.     Palpations: Abdomen is soft.     Tenderness: There is no abdominal tenderness. There is no guarding.  Musculoskeletal:     Cervical back: Normal range of motion and neck supple.  Lymphadenopathy:     Cervical: No cervical adenopathy.  Skin:    General: Skin is warm and dry.     Capillary Refill: Capillary refill takes less than 2 seconds.     Coloration: Skin is not jaundiced or pale.     Findings: No erythema or rash.  Neurological:     Mental Status: He is alert and oriented to person, place, and time.  Psychiatric:        Behavior: Behavior is cooperative.      UC Treatments / Results  Labs (all labs ordered are listed, but only abnormal results are displayed) Labs Reviewed - No data to display  EKG   Radiology  No results found.  Procedures Procedures (including critical care time)  Medications Ordered in UC Medications - No data to display  Initial Impression / Assessment and Plan / UC Course  I have reviewed the triage vital signs and the nursing notes.  Pertinent labs & imaging results that were available during my care of the patient were reviewed by me and considered in my  medical decision making (see chart for details).   Patient is well-appearing, normotensive, afebrile, not tachycardic, not tachypneic, oxygenating well on room air.    Acute bacterial sinusitis Patient with significant sinus congestion/pressure when leaning forward Treat with Augmentin twice daily for 7 days Supportive care discussed-continue Coricidin, Mucinex, increased hydration Start cough suppressants as needed for dry cough ER and return precautions discussed  The patient was given the opportunity to ask questions.  All questions answered to their satisfaction.  The patient is in agreement to this plan.    Final Clinical Impressions(s) / UC Diagnoses   Final diagnoses:  Acute bacterial sinusitis     Discharge Instructions      You have a sinus infection.  Please take the antibiotic (Augmentin) as prescribed.  Start nasal saline rinses.  Continue guaifenesin and Coricidin as needed.  You can use the Tessalon as needed for dry cough.     ED Prescriptions     Medication Sig Dispense Auth. Provider   amoxicillin-clavulanate (AUGMENTIN) 875-125 MG tablet Take 1 tablet by mouth 2 (two) times daily for 7 days. 14 tablet Noemi Chapel A, NP   benzonatate (TESSALON) 100 MG capsule Take 1 capsule (100 mg total) by mouth 3 (three) times daily as needed for cough. Do not take with alcohol or while driving or operating heavy machinery.  May cause drowsiness. 21 capsule Eulogio Bear, NP      PDMP not reviewed this encounter.   Eulogio Bear, NP 01/09/22 [phone]

## 2022-01-16 ENCOUNTER — Other Ambulatory Visit: Payer: Self-pay | Admitting: Student

## 2022-01-16 NOTE — Telephone Encounter (Signed)
Refill request for warfarin:  Last INR was 2.6 on 12/27/21 Next INR due 02/07/22 LOV was 08/24/21  Alma Friendly MD  Refill approved.

## 2022-02-07 ENCOUNTER — Ambulatory Visit: Payer: Medicare Other | Attending: Cardiology | Admitting: *Deleted

## 2022-02-07 DIAGNOSIS — Z5181 Encounter for therapeutic drug level monitoring: Secondary | ICD-10-CM

## 2022-02-07 DIAGNOSIS — I480 Paroxysmal atrial fibrillation: Secondary | ICD-10-CM | POA: Diagnosis not present

## 2022-02-07 LAB — POCT INR: INR: 2.1 (ref 2.0–3.0)

## 2022-02-07 NOTE — Patient Instructions (Signed)
Continue 1 tablet daily except 1 1/2 tablets Tuesdays, Thursdays and Saturdays  Recheck in 6 wks

## 2022-03-21 ENCOUNTER — Ambulatory Visit: Payer: Medicare Other | Attending: Cardiology | Admitting: *Deleted

## 2022-03-21 DIAGNOSIS — I480 Paroxysmal atrial fibrillation: Secondary | ICD-10-CM

## 2022-03-21 DIAGNOSIS — Z5181 Encounter for therapeutic drug level monitoring: Secondary | ICD-10-CM | POA: Diagnosis not present

## 2022-03-21 LAB — POCT INR: INR: 2.6 (ref 2.0–3.0)

## 2022-03-21 NOTE — Patient Instructions (Signed)
Continue 1 tablet daily except 1 1/2 tablets Tuesdays, Thursdays and Saturdays  Recheck in 6 wks

## 2022-05-02 ENCOUNTER — Ambulatory Visit: Payer: Medicare Other | Attending: Cardiology | Admitting: Pharmacist

## 2022-05-02 DIAGNOSIS — Z5181 Encounter for therapeutic drug level monitoring: Secondary | ICD-10-CM | POA: Diagnosis not present

## 2022-05-02 DIAGNOSIS — I480 Paroxysmal atrial fibrillation: Secondary | ICD-10-CM

## 2022-05-02 LAB — POCT INR: INR: 2.3 (ref 2.0–3.0)

## 2022-05-02 NOTE — Patient Instructions (Signed)
Description   Continue 1 tablet daily except 1 1/2 tablets Tuesdays, Thursdays and Saturdays  Recheck in 6 wks

## 2022-06-13 ENCOUNTER — Ambulatory Visit: Payer: Medicare Other | Attending: Cardiology | Admitting: *Deleted

## 2022-06-13 DIAGNOSIS — I480 Paroxysmal atrial fibrillation: Secondary | ICD-10-CM | POA: Diagnosis not present

## 2022-06-13 DIAGNOSIS — Z5181 Encounter for therapeutic drug level monitoring: Secondary | ICD-10-CM

## 2022-06-13 LAB — POCT INR
INR: 2.9 (ref 2.0–3.0)
INR: 2.9 (ref 2.0–3.0)

## 2022-06-13 NOTE — Patient Instructions (Signed)
Continue 1 tablet daily except 1 1/2 tablets Tuesdays, Thursdays and Saturdays  Recheck in 6 wks 

## 2022-06-15 ENCOUNTER — Encounter: Payer: Self-pay | Admitting: Internal Medicine

## 2022-06-27 ENCOUNTER — Ambulatory Visit
Admission: EM | Admit: 2022-06-27 | Discharge: 2022-06-27 | Disposition: A | Discharge status: Home / Self Care | Payer: Medicare Other | Attending: Nurse Practitioner | Admitting: Nurse Practitioner

## 2022-06-27 DIAGNOSIS — J069 Acute upper respiratory infection, unspecified: Secondary | ICD-10-CM | POA: Insufficient documentation

## 2022-06-27 DIAGNOSIS — H1033 Unspecified acute conjunctivitis, bilateral: Secondary | ICD-10-CM | POA: Diagnosis present

## 2022-06-27 DIAGNOSIS — Z1152 Encounter for screening for COVID-19: Secondary | ICD-10-CM | POA: Insufficient documentation

## 2022-06-27 MED ORDER — ERYTHROMYCIN 5 MG/GM OP OINT: TOPICAL_OINTMENT | Freq: Four times a day (QID) | OPHTHALMIC | 0 refills | Status: AC

## 2022-06-27 MED ORDER — BENZONATATE 100 MG PO CAPS: 100.0000 mg | ORAL_CAPSULE | Freq: Three times a day (TID) | ORAL | 0 refills | Status: DC | PRN

## 2022-06-27 NOTE — Discharge Instructions (Addendum)
You have a viral upper respiratory infection.  Symptoms should improve over the next week to 10 days.  If you develop chest pain or shortness of breath, go to the emergency room.  We have tested you today for COVID-19.  You will see the results in Mychart and we will call you with positive results.  Please stay home and isolate until you are aware of the results.    Some things that can make you feel better are: - Increased rest - Increasing fluid with water/sugar free electrolytes - Acetaminophen and ibuprofen as needed for fever/pain - Salt water gargling, chloraseptic spray and throat lozenges - OTC guaifenesin (Mucinex) 600 mg twice daily for congestion - Saline sinus flushes or a neti pot - Humidifying the air -Tessalon Perles during the day as needed for dry cough  Start the eye ointment to help with the eye drainage

## 2022-06-27 NOTE — ED Triage Notes (Signed)
Pt c/o cough,nasal congestion, swelling and redness in eyes tickle in back of the throat.   Pt has taken coricidin cold and cough and Tylenol sinus, has helped a little but sx persist

## 2022-06-27 NOTE — ED Provider Notes (Signed)
[facility]    CSN: [id number] Arrival date & time: 06/27/22  1237      History   Chief Complaint No chief complaint on file.   HPI Riley [name] is a 77 y.o. male.   Patient presents today with 2-day history of congested cough, runny and stuffy nose, postnasal drainage that is worse when laying down, sore throat, headache, decreased appetite, and fatigue.  He denies fever, shortness of breath or chest pain, chest tightness, ear pain, abdominal pain, nausea/vomiting, and diarrhea.  No known sick contacts.  Has been taking Coricidin and Tylenol sinus relief with minimal improvement.  Reports he does have a history of seasonal allergies and is currently taking Xyzal at nighttime.  Patient is also concerned about bilateral eye drainage, right eye redness has been present since symptoms started.  Reports he tried over-the-counter eyedrops prior to coming to urgent care today without benefit.    Past Medical History:  Diagnosis Date   Chronic anticoagulation    Chronic back pain    Diabetes mellitus, type 2    no insulin   Edema    GERD (gastroesophageal reflux disease)    Hyperlipidemia    Hypertension    normal coronary angiography and normal EF in 12/98   Nodule of left lung    Granuloma in left upper lobe   Paroxysmal atrial fibrillation    12/2005; sinus bradycardia secondary to medications   Sleep apnea     Patient Active Problem List   Diagnosis Date Noted   Chronic venous insufficiency 09/07/2020   Abdominal pain 10/09/2018   GERD (gastroesophageal reflux disease) 12/20/2017   H/O adenomatous polyp of colon 12/20/2017   Morbid obesity 02/11/2015   Encounter for therapeutic drug monitoring 04/10/2013   Constipation 11/20/2012   Hemorrhoids 11/20/2012   OSA (obstructive sleep apnea) 06/26/2012   Seasonal allergies 04/29/2012   Paroxysmal atrial fibrillation    Chronic anticoagulation    Hypertension    Diabetes mellitus type II, controlled     Hyperlipidemia    Pedal edema 06/08/2009    Past Surgical History:  Procedure Laterality Date   APPENDECTOMY  1998   COLONOSCOPY N/A 11/26/2012   IHK:[id number] 2 hemorrhoids. Pancolonic diverticulosis. Colonic polyps removed as described above. One hemostasis clip applied. TUBULAR ADENOMA. Surveillance Sept 2019.    COLONOSCOPY N/A 02/06/2018   two 3-5 mm cecal polyps, pancolonic diverticulosis. tubular adenoma. no surveillance due to age   INSERTION OF MESH N/A 01/27/2013   Procedure: INSERTION OF MESH;  Surgeon: Dalia Heading, MD;  Location: [facility];  Service: General;  Laterality: N/A;   POLYPECTOMY  02/06/2018   Procedure: POLYPECTOMY;  Surgeon: Corbin Ade, MD;  [facility];  Service: Endoscopy;;  (colon)   TONSILLECTOMY     as a child   UMBILICAL HERNIA REPAIR N/A 01/27/2013   Procedure: HERNIA REPAIR UMBILICAL ADULT;  Surgeon: Dalia Heading, MD;  Location: [facility];  Service: General;  Laterality: N/A;       Home Medications    Prior to Admission medications   Medication Sig Start Date End Date Taking? Authorizing Provider  erythromycin ophthalmic ointment Place into both eyes 4 (four) times daily for 7 days. Place a 1/2 inch ribbon of ointment into the lower eyelid. 06/27/22 07/04/22 Yes Valentino Nose, NP  acetaminophen (TYLENOL) 500 MG tablet Take 500 mg by mouth every 6 (six) hours as needed (for pain.).    [provider]  benzonatate (TESSALON) 100  MG capsule Take 1 capsule (100 mg total) by mouth 3 (three) times daily as needed for cough. Do not take with alcohol or while driving or operating heavy machinery.  May cause drowsiness. 06/27/22   Valentino Nose, NP  calcium carbonate (TUMS - DOSED IN MG ELEMENTAL CALCIUM) 500 MG chewable tablet Chew 1 tablet by mouth as needed for indigestion or heartburn.    [provider]  chlorthalidone (HYGROTON) 25 MG tablet Take 1 tablet (25 mg total) by mouth daily as needed (swelling). 01/26/20    Strader, Lennart Pall, PA-C  diltiazem (CARDIZEM) 30 MG tablet Take 1 tablet (30 mg total) by mouth daily as needed (Palpations). 01/01/18   Laqueta Linden, MD  doxazosin (CARDURA) 4 MG tablet TAKE THREE TABLETS BY MOUTH EVERY EVENING 01/09/22   Strader, [place] M, PA-C  glimepiride (AMARYL) 1 MG tablet Take 1 tablet by mouth daily. 12/09/18   [provider]  Hydrocortisone (PREPARATION H EX) Place 1 application rectally daily as needed (irritation/discomfort).     [provider]  LINZESS 290 MCG CAPS capsule TAKE ONE CAPSULE BY MOUTH every morning 10/11/21   Gelene Mink, NP  lisinopril (ZESTRIL) 40 MG tablet TAKE ONE TABLET BY MOUTH EVERY MORNING 10/11/21   [place] Ouch, Lennart Pall, PA-C  Magnesium Oxide 200 MG TABS Take 1 tablet (200 mg total) by mouth daily. 11/02/16   Jodelle Gross, NP  metFORMIN (GLUCOPHAGE) 1000 MG tablet Take 1 tablet by mouth 2 (two) times daily. 12/09/18   [provider]  Multiple Vitamin (MULTIVITAMIN) tablet Take 1 tablet by mouth daily.    [provider]  pantoprazole (PROTONIX) 40 MG tablet TAKE ONE TABLET BY MOUTH ONCE DAILY 30 MINUTES BEFORE BREAKFAST 04/08/21   Gelene Mink, NP  Phenyleph-Chlorphen-Hydrocod [facility] HD PO) Take by mouth as needed.    [provider]  pravastatin (PRAVACHOL) 20 MG tablet Take 1 tablet (20 mg total) by mouth every evening. 10/01/18   Laqueta Linden, MD  Triamcinolone Acetonide (NASACORT ALLERGY 24HR NA) Place 1 spray into the nose daily as needed (congestion).    [provider]  verapamil (CALAN-SR) 180 MG CR tablet TAKE TWO TABLETS BY MOUTH EVERY EVENING 07/11/21   Strader, [place] M, PA-C  warfarin (COUMADIN) 5 MG tablet TAKE ONE TABLET BY MOUTH ONCE DAILY EXCEPT TAKE 1 AND 1/2 TABLETS ON TUESDAYS, THURSDAYS AND SATURDAYS AS DIRECTED 01/16/22   Hilty, Lisette Abu, MD    Family History Family History  Problem Relation Age of Onset   Heart disease Father    Heart  disease Mother    Bronchitis Mother    Allergies Other        whole family   Colon cancer Neg Hx     Social History Social History   Tobacco Use   Smoking status: Never   Smokeless tobacco: Never  Vaping Use   Vaping Use: Never used  Substance Use Topics   Alcohol use: No    Alcohol/week: 0.0 standard drinks of alcohol   Drug use: Never     Allergies   Patient has no known allergies.   Review of Systems Review of Systems Per HPI  Physical Exam Triage Vital Signs ED Triage Vitals [06/27/22 1301]  Enc Vitals Group     BP 131/77     Pulse Rate 86     Resp 20     Temp 98.3 F (36.8 C)     Temp Source Oral  SpO2 93 %     Weight      Height      Head Circumference      Peak Flow      Pain Score 3     Pain Loc      Pain Edu?      Excl. in [place]?    No data found.  Updated Vital Signs BP 131/77 (BP Location: Right Arm)   Pulse 86   Temp 98.3 F (36.8 C) (Oral)   Resp 20   SpO2 93%   Visual Acuity Right Eye Distance:   Left Eye Distance:   Bilateral Distance:    Right Eye Near:   Left Eye Near:    Bilateral Near:     Physical Exam Vitals and nursing note reviewed.  Constitutional:      General: He is not in acute distress.    Appearance: Normal appearance. He is not ill-appearing or toxic-appearing.  HENT:     Head: Normocephalic and atraumatic.     Right Ear: Tympanic membrane, ear canal and external ear normal.     Left Ear: Tympanic membrane, ear canal and external ear normal.     Nose: Rhinorrhea present. No congestion.     Mouth/Throat:     Mouth: Mucous membranes are moist.     Pharynx: Oropharynx is clear. Posterior oropharyngeal erythema present. No oropharyngeal exudate.  Eyes:     General: No scleral icterus.    Extraocular Movements: Extraocular movements intact.     Conjunctiva/sclera:     Right eye: Right conjunctiva is injected. Exudate present.     Left eye: Exudate present.  Cardiovascular:     Rate and Rhythm: Normal  rate and regular rhythm.  Pulmonary:     Effort: Pulmonary effort is normal. No respiratory distress.     Breath sounds: Normal breath sounds. No wheezing, rhonchi or rales.  Abdominal:     General: Abdomen is flat. Bowel sounds are normal. There is no distension.     Palpations: Abdomen is soft.     Tenderness: There is no abdominal tenderness. There is no guarding.  Musculoskeletal:     Cervical back: Normal range of motion and neck supple.  Lymphadenopathy:     Cervical: Cervical adenopathy present.  Skin:    General: Skin is warm and dry.     Coloration: Skin is not jaundiced or pale.     Findings: No erythema or rash.  Neurological:     Mental Status: He is alert and oriented to person, place, and time.  Psychiatric:        Behavior: Behavior is cooperative.      UC Treatments / Results  Labs (all labs ordered are listed, but only abnormal results are displayed) Labs Reviewed  SARS CORONAVIRUS 2 (TAT 6-24 HRS)    EKG   Radiology No results found.  Procedures Procedures (including critical care time)  Medications Ordered in UC Medications - No data to display  Initial Impression / Assessment and Plan / UC Course  I have reviewed the triage vital signs and the nursing notes.  Pertinent labs & imaging results that were available during my care of the patient were reviewed by me and considered in my medical decision making (see chart for details).   Patient is well-appearing, normotensive, afebrile, not tachycardic, not tachypneic, oxygenating well on room air.    1. Viral URI with cough 2. Encounter for screening for COVID-19 Suspect viral etiology Vital signs and examination today  are reassuring COVID-19 test is pending Supportive care discussed with patient Start Flonase and Tessalon Perles Patient is a candidate for molnupiravir if he test positive for COVID-19 ER and return precautions discussed with patient  3. Acute bacterial conjunctivitis of both  eyes Treat with erythromycin ointment 4 times daily for 7 days Supportive care discussed with patient  The patient was given the opportunity to ask questions.  All questions answered to their satisfaction.  The patient is in agreement to this plan.    Final Clinical Impressions(s) / UC Diagnoses   Final diagnoses:  Viral URI with cough  Encounter for screening for COVID-19  Acute bacterial conjunctivitis of both eyes     Discharge Instructions      You have a viral upper respiratory infection.  Symptoms should improve over the next week to 10 days.  If you develop chest pain or shortness of breath, go to the emergency room.  We have tested you today for COVID-19.  You will see the results in Mychart and we will call you with positive results.  Please stay home and isolate until you are aware of the results.    Some things that can make you feel better are: - Increased rest - Increasing fluid with water/sugar free electrolytes - Acetaminophen and ibuprofen as needed for fever/pain - Salt water gargling, chloraseptic spray and throat lozenges - OTC guaifenesin (Mucinex) 600 mg twice daily for congestion - Saline sinus flushes or a neti pot - Humidifying the air -Tessalon Perles during the day as needed for dry cough  Start the eye ointment to help with the eye drainage     ED Prescriptions     Medication Sig Dispense Auth. Provider   benzonatate (TESSALON) 100 MG capsule Take 1 capsule (100 mg total) by mouth 3 (three) times daily as needed for cough. Do not take with alcohol or while driving or operating heavy machinery.  May cause drowsiness. 21 capsule Cathlean Marseilles A, NP   erythromycin ophthalmic ointment Place into both eyes 4 (four) times daily for 7 days. Place a 1/2 inch ribbon of ointment into the lower eyelid. 3.5 g Valentino Nose, NP      PDMP not reviewed this encounter.   Valentino Nose, NP 06/27/22 [phone]

## 2022-06-28 LAB — SARS CORONAVIRUS 2 (TAT 6-24 HRS): SARS Coronavirus 2: NEGATIVE

## 2022-07-11 ENCOUNTER — Other Ambulatory Visit: Payer: Self-pay | Admitting: Student

## 2022-07-13 ENCOUNTER — Other Ambulatory Visit (HOSPITAL_COMMUNITY): Payer: Self-pay | Admitting: Nephrology

## 2022-07-13 ENCOUNTER — Telehealth: Payer: Self-pay | Admitting: Internal Medicine

## 2022-07-13 DIAGNOSIS — N179 Acute kidney failure, unspecified: Secondary | ICD-10-CM

## 2022-07-13 DIAGNOSIS — N189 Chronic kidney disease, unspecified: Secondary | ICD-10-CM

## 2022-07-13 MED ORDER — LISINOPRIL 20 MG PO TABS: 20.0000 mg | ORAL_TABLET | Freq: Every day | ORAL | 3 refills | Status: DC

## 2022-07-13 NOTE — Telephone Encounter (Signed)
Pt c/o medication issue:  1. Name of Medication: lisinopril (ZESTRIL) 40 MG tablet   2. How are you currently taking this medication (dosage and times per day)? 1 tablet daily   3. Are you having a reaction (difficulty breathing--STAT)? No   4. What is your medication issue? Patient went to the kidney doctor today and they are wanting to decrease this medication due to his kidney function. They advised him to take a 1/2 a tablet daily. Due to this they are requesting a 20 mg prescription be sent to the pharmacy so he doesn't have to cut the medication in half or if he can take it every other day. Please advise.

## 2022-07-13 NOTE — Telephone Encounter (Signed)
Patients wife notified and verbalized understanding. Patients wife had no further questions or concerns at this time.

## 2022-07-20 ENCOUNTER — Ambulatory Visit (HOSPITAL_COMMUNITY)
Admission: RE | Admit: 2022-07-20 | Discharge: 2022-07-20 | Disposition: A | Discharge status: Home / Self Care | Payer: Medicare Other | Source: Ambulatory Visit | Attending: Nephrology | Admitting: Nephrology

## 2022-07-20 DIAGNOSIS — N179 Acute kidney failure, unspecified: Secondary | ICD-10-CM | POA: Diagnosis present

## 2022-07-20 DIAGNOSIS — N189 Chronic kidney disease, unspecified: Secondary | ICD-10-CM | POA: Diagnosis not present

## 2022-07-25 ENCOUNTER — Ambulatory Visit: Payer: Medicare Other | Attending: Cardiology | Admitting: *Deleted

## 2022-07-25 DIAGNOSIS — Z5181 Encounter for therapeutic drug level monitoring: Secondary | ICD-10-CM | POA: Diagnosis not present

## 2022-07-25 DIAGNOSIS — I480 Paroxysmal atrial fibrillation: Secondary | ICD-10-CM | POA: Diagnosis not present

## 2022-07-25 LAB — POCT INR: INR: 2.2 (ref 2.0–3.0)

## 2022-07-25 NOTE — Patient Instructions (Signed)
Continue 1 tablet daily except 1 1/2 tablets Tuesdays, Thursdays and Saturdays  Recheck in 6 wks 

## 2022-08-21 ENCOUNTER — Ambulatory Visit: Payer: Medicare Other | Admitting: Internal Medicine

## 2022-08-27 ENCOUNTER — Other Ambulatory Visit: Payer: Self-pay | Admitting: Internal Medicine

## 2022-08-28 NOTE — Telephone Encounter (Signed)
Refill request for warfarin:  Last INR was 2.2 on 07/25/22 Next INR due 09/05/22 LOV was 08/24/21  (Appt on 09/04/22)  Refill approved.

## 2022-09-04 ENCOUNTER — Ambulatory Visit: Payer: Medicare Other | Attending: Internal Medicine | Admitting: Internal Medicine

## 2022-09-04 ENCOUNTER — Telehealth: Payer: Self-pay | Admitting: Internal Medicine

## 2022-09-04 ENCOUNTER — Encounter: Payer: Self-pay | Admitting: Internal Medicine

## 2022-09-04 VITALS — BP 142/86 | HR 78 | Ht 72.0 in | Wt 229.4 lb

## 2022-09-04 DIAGNOSIS — I480 Paroxysmal atrial fibrillation: Secondary | ICD-10-CM

## 2022-09-04 MED ORDER — METOPROLOL TARTRATE 25 MG PO TABS: 25.0000 mg | ORAL_TABLET | Freq: Two times a day (BID) | ORAL | 3 refills | Status: DC

## 2022-09-04 MED ORDER — HYDRALAZINE HCL 100 MG PO TABS: 100.0000 mg | ORAL_TABLET | Freq: Three times a day (TID) | ORAL | 1 refills | Status: AC

## 2022-09-04 MED ORDER — CHLORTHALIDONE 25 MG PO TABS: 12.5000 mg | ORAL_TABLET | ORAL | 3 refills | Status: DC

## 2022-09-04 NOTE — Telephone Encounter (Signed)
Wife states renal-Dr.Bhutani started husband on hydralazine 100 mg TID and changed chlorthalidone to 12.5 mg every other day.   I will FYI Dr.Mallipeddi

## 2022-09-04 NOTE — Patient Instructions (Signed)
Medication Instructions:   STOP Verapamil  START Lopressor 25 mg twice a day   Labwork: None today  Testing/Procedures: Your physician has requested that you have an echocardiogram. Echocardiography is a painless test that uses sound waves to create images of your heart. It provides your doctor with information about the size and shape of your heart and how well your heart's chambers and valves are working. This procedure takes approximately one hour. There are no restrictions for this procedure. Please do NOT wear cologne, perfume, aftershave, or lotions (deodorant is allowed). Please arrive 15 minutes prior to your appointment time.   Follow-Up: 2 weeks Nurse visit for EKG   3 months Dr.Mallipeddi  Any Other Special Instructions Will Be Listed Below (If Applicable).  If you need a refill on your cardiac medications before your next appointment, please call your pharmacy.

## 2022-09-04 NOTE — Progress Notes (Signed)
Cardiology Office Note  Date: 09/04/2022   ID: Riley [name], DOB [birth year], MRN [id number]  PCP:  Riley Nevins, MD  Cardiologist:  Marjo Bicker, MD Electrophysiologist:  None    History of Present Illness: Riley [name] is a 77 y.o. male known to have HTN, DM 2, HLD, paroxysmal A-fib (diagnosed in 2007, sinus bradycardia secondary to medications), OSA on CPAP is here for follow-up visit.  No symptoms, overall doing great.  No fatigue, palpitations or SOB.  No orthopnea, PND.  Has chronic leg swelling bilaterally.  Compliant with medications, Coumadin with goal INR between 2 and 3.  Past Medical History:  Diagnosis Date   Chronic anticoagulation    Chronic back pain    Diabetes mellitus, type 2 (HCC)    no insulin   Edema    GERD (gastroesophageal reflux disease)    Hyperlipidemia    Hypertension    normal coronary angiography and normal EF in 12/98   Nodule of left lung    Granuloma in left upper lobe   Paroxysmal atrial fibrillation (HCC)    12/2005; sinus bradycardia secondary to medications   Sleep apnea     Past Surgical History:  Procedure Laterality Date   APPENDECTOMY  1998   COLONOSCOPY N/A 11/26/2012   NWG:NFAOZ 2 hemorrhoids. Pancolonic diverticulosis. Colonic polyps removed as described above. One hemostasis clip applied. TUBULAR ADENOMA. Surveillance Sept 2019.    COLONOSCOPY N/A 02/06/2018   two 3-5 mm cecal polyps, pancolonic diverticulosis. tubular adenoma. no surveillance due to age   INSERTION OF MESH N/A 01/27/2013   Procedure: INSERTION OF MESH;  Surgeon: Dalia Heading, MD;  Location: [facility];  Service: General;  Laterality: N/A;   POLYPECTOMY  02/06/2018   Procedure: POLYPECTOMY;  Surgeon: Corbin Ade, MD;  [facility];  Service: Endoscopy;;  (colon)   TONSILLECTOMY     as a child   UMBILICAL HERNIA REPAIR N/A 01/27/2013   Procedure: HERNIA REPAIR UMBILICAL ADULT;  Surgeon: Dalia Heading, MD;  Location: [facility];  Service:  General;  Laterality: N/A;    Current Outpatient Medications  Medication Sig Dispense Refill   acetaminophen (TYLENOL) 500 MG tablet Take 500 mg by mouth every 6 (six) hours as needed (for pain.).     benzonatate (TESSALON) 100 MG capsule Take 1 capsule (100 mg total) by mouth 3 (three) times daily as needed for cough. Do not take with alcohol or while driving or operating heavy machinery.  May cause drowsiness. 21 capsule 0   calcium carbonate (TUMS - DOSED IN MG ELEMENTAL CALCIUM) 500 MG chewable tablet Chew 1 tablet by mouth as needed for indigestion or heartburn.     chlorthalidone (HYGROTON) 25 MG tablet Take 1 tablet (25 mg total) by mouth daily as needed (swelling). 90 tablet 3   diltiazem (CARDIZEM) 30 MG tablet Take 1 tablet (30 mg total) by mouth daily as needed (Palpations). 30 tablet 11   doxazosin (CARDURA) 4 MG tablet TAKE THREE TABLETS BY MOUTH EVERY EVENING 270 tablet 3   glimepiride (AMARYL) 1 MG tablet Take 1 tablet by mouth daily.     Hydrocortisone (PREPARATION H EX) Place 1 application rectally daily as needed (irritation/discomfort).      LINZESS 290 MCG CAPS capsule TAKE ONE CAPSULE BY MOUTH every morning 90 capsule 3   lisinopril (ZESTRIL) 20 MG tablet Take 1 tablet (20 mg total) by mouth daily. 90 tablet 3   Magnesium Oxide 200 MG TABS Take 1  tablet (200 mg total) by mouth daily. 30 each 11   metFORMIN (GLUCOPHAGE) 1000 MG tablet Take 1 tablet by mouth 2 (two) times daily.     Multiple Vitamin (MULTIVITAMIN) tablet Take 1 tablet by mouth daily.     pantoprazole (PROTONIX) 40 MG tablet TAKE ONE TABLET BY MOUTH ONCE DAILY 30 MINUTES BEFORE BREAKFAST 90 tablet 3   Phenyleph-Chlorphen-Hydrocod (CHLORPHEN HD PO) Take by mouth as needed.     pravastatin (PRAVACHOL) 20 MG tablet Take 1 tablet (20 mg total) by mouth every evening. 90 tablet 3   Triamcinolone Acetonide (NASACORT ALLERGY 24HR NA) Place 1 spray into the nose daily as needed (congestion).     verapamil (CALAN-SR)  180 MG CR tablet TAKE TWO TABLETS BY MOUTH EVERY EVENING.  Please keep scheduled appointment for future refills. Thank you. 180 tablet 0   warfarin (COUMADIN) 5 MG tablet TAKE ONE TABLET BY MOUTH ONCE DAILY EXCEPT TAKE 1 AND 1/2 TABLETS ON TUESDAYS, THURSDAYS AND SATURDAYS AS DIRECTED 40 tablet 5   No current facility-administered medications for this visit.   Allergies:  Patient has no known allergies.   Social History: The patient  reports that he has never smoked. He has never used smokeless tobacco. He reports that he does not drink alcohol and does not use drugs.   Family History: The patient's family history includes Allergies in an other family member; Bronchitis in his mother; Heart disease in his father and mother.   ROS:  Please see the history of present illness. Otherwise, complete review of systems is positive for none.  All other systems are reviewed and negative.   Physical Exam: VS:  BP (!) 142/86   Pulse 78   Ht 6' (1.829 m)   Wt 229 lb 6.4 oz (104.1 kg)   SpO2 96%   BMI 31.11 kg/m , BMI Body mass index is 31.11 kg/m.  Wt Readings from Last 3 Encounters:  09/04/22 229 lb 6.4 oz (104.1 kg)  11/21/21 233 lb (105.7 kg)  08/24/21 231 lb 12.8 oz (105.1 kg)    General: Patient appears comfortable at rest. HEENT: Conjunctiva and lids normal, oropharynx clear with moist mucosa. Neck: Supple, no elevated JVP or carotid bruits, no thyromegaly. Lungs: Clear to auscultation, nonlabored breathing at rest. Cardiac: Regular rate and rhythm, no S3 or significant systolic murmur, no pericardial rub. Abdomen: Soft, nontender, no hepatomegaly, bowel sounds present, no guarding or rebound. Extremities: No pitting edema, distal pulses 2+. Skin: Warm and dry. Musculoskeletal: No kyphosis. Neuropsychiatric: Alert and oriented x3, affect grossly appropriate.  Recent Labwork: No results found for requested labs within last 365 days.     Component Value Date/Time   CHOL 133  11/01/2016 0849   TRIG 125 11/01/2016 0849   TRIG 92 and 07/30/2006 0000   HDL 40 (L) 11/01/2016 0849   CHOLHDL 3.3 11/01/2016 0849   VLDL 25 11/01/2016 0849   LDLCALC 68 11/01/2016 0849   LDLCALC 48 07/30/2006 0000    Assessment and Plan:  # Paroxysmal A-fib -Diagnosed in 2007, has been in NSR since.  EKG today showed atrial fibrillation, rate controlled.  Asymptomatic.  Will bring him back for EKG in 2 weeks via nurse visit. -Stop verapamil due to leg swelling and start metoprolol tartrate 25 mg twice daily.  Diltiazem as needed for palpitations.  Continue Coumadin with goal INR between 2 and 3. -Update 2D echocardiogram.  # HTN, controlled -Continue lisinopril 20 mg once daily, switch verapamil to metoprolol tartrate 25 mg  twice daily due to leg swelling.  # HLD -Continue pravastatin 20 mg nightly.  # OSA on CPAP -Continue CPAP  I have spent a total of 30 minutes with patient reviewing chart, EKGs, labs and examining patient as well as establishing an assessment and plan that was discussed with the patient.  > 50% of time was spent in direct patient care.    Medication Adjustments/Labs and Tests Ordered: Current medicines are reviewed at length with the patient today.  Concerns regarding medicines are outlined above.   Tests Ordered: No orders of the defined types were placed in this encounter.   Medication Changes: No orders of the defined types were placed in this encounter.   Disposition:  Follow up  2 weeks for nurse visit for EKG, 3 months with me  Signed, Seona Clemenson Verne Spurr, MD, 09/04/2022 11:05 AM    [facility] [address] [place], [place] [address]

## 2022-09-04 NOTE — Telephone Encounter (Signed)
Pt c/o medication issue:  1. Name of Medication: Hydralazine 100 mg 1 x day chlorthalidone (HYGROTON) 25 MG tablet   2. How are you currently taking this medication (dosage and times per day)? chlorthalidone (HYGROTON) 25 MG tablet  cut in half every other day   3. Are you having a reaction (difficulty breathing--STAT)?   4. What is your medication issue? Patient's wife is calling stating that the kidney specialist has changed patient's medication and she is calling to make the cardiologist aware.  Patient's wife is requesting call back to discuss further.

## 2022-09-05 ENCOUNTER — Ambulatory Visit: Payer: Medicare Other | Attending: Cardiology | Admitting: *Deleted

## 2022-09-05 DIAGNOSIS — Z5181 Encounter for therapeutic drug level monitoring: Secondary | ICD-10-CM

## 2022-09-05 DIAGNOSIS — I480 Paroxysmal atrial fibrillation: Secondary | ICD-10-CM | POA: Diagnosis not present

## 2022-09-05 LAB — POCT INR: INR: 2.6 (ref 2.0–3.0)

## 2022-09-05 NOTE — Patient Instructions (Signed)
Continue 1 tablet daily except 1 1/2 tablets Tuesdays, Thursdays and Saturdays  Recheck in 6 wks 

## 2022-09-11 NOTE — Telephone Encounter (Signed)
Wife notified Dr.Mallipeddi ok with med changes by Dr.Bhutani

## 2022-09-19 ENCOUNTER — Ambulatory Visit: Payer: Medicare Other

## 2022-09-20 ENCOUNTER — Ambulatory Visit: Payer: Medicare Other | Attending: Student

## 2022-09-20 VITALS — HR 103 | Ht 72.0 in | Wt 233.0 lb

## 2022-09-20 DIAGNOSIS — I480 Paroxysmal atrial fibrillation: Secondary | ICD-10-CM | POA: Diagnosis not present

## 2022-09-20 MED ORDER — METOPROLOL TARTRATE 50 MG PO TABS: 50.0000 mg | ORAL_TABLET | Freq: Two times a day (BID) | ORAL | 3 refills | Status: DC

## 2022-09-20 NOTE — Progress Notes (Signed)
Patient presents today for nurse visit- EKG.  Pt stated since he started metoprolol he has felt good.   Pt heart rate with pulse ox was 114. Pt stated that he feels palpitations on and off, but has been dealing with sinus issues.

## 2022-09-20 NOTE — Addendum Note (Signed)
Addended by: Roseanne Reno on: 09/20/2022 03:54 PM   Modules accepted: Orders

## 2022-09-20 NOTE — Patient Instructions (Addendum)
Medication Instructions:  Your physician has recommended you make the following change in your medication:   -Increase Metoprolol to 50 mg twice daily. DO NOT TAKE IF THE TOP NUMBER OF YOUR BLOOD PRESSURE IS UNDER 90.  *If you need a refill on your cardiac medications before your next appointment, please call your pharmacy*   Lab Work: None If you have labs (blood work) drawn today and your tests are completely normal, you will receive your results only by: MyChart Message (if you have MyChart) OR A paper copy in the mail If you have any lab test that is abnormal or we need to change your treatment, we will call you to review the results.   Testing/Procedures: None   Follow-Up: At [facility], you and your health needs are our priority.  As part of our continuing mission to provide you with exceptional heart care, we have created designated Provider Care Teams.  These Care Teams include your primary Cardiologist (physician) and Advanced Practice Providers (APPs -  Physician Assistants and Nurse Practitioners) who all work together to provide you with the care you need, when you need it.  We recommend signing up for the patient portal called "MyChart".  Sign up information is provided on this After Visit Summary.  MyChart is used to connect with patients for Virtual Visits (Telemedicine).  Patients are able to view lab/test results, encounter notes, upcoming appointments, etc.  Non-urgent messages can be sent to your provider as well.   To learn more about what you can do with MyChart, go to [email or web address].

## 2022-10-04 ENCOUNTER — Ambulatory Visit (INDEPENDENT_AMBULATORY_CARE_PROVIDER_SITE_OTHER): Payer: Medicare Other | Admitting: Gastroenterology

## 2022-10-04 ENCOUNTER — Encounter: Payer: Self-pay | Admitting: Gastroenterology

## 2022-10-04 DIAGNOSIS — K5900 Constipation, unspecified: Secondary | ICD-10-CM

## 2022-10-04 MED ORDER — LINACLOTIDE 290 MCG PO CAPS
290.0000 ug | ORAL_CAPSULE | Freq: Every morning | ORAL | 3 refills | Status: DC
Start: 2022-10-04 — End: 2023-10-29

## 2022-10-04 NOTE — Progress Notes (Signed)
Gastroenterology Office Note     Primary Care Physician:  Elfredia Nevins, MD  Primary Gastroenterologist: Dr. Jena Gauss    Chief Complaint   Chief Complaint  Patient presents with   Follow-up    Doing well, alternates linzess with stool softener every other day     History of Present Illness   Riley [name] is a 77 y.o. male presenting today with a history of constipation for routine follow-up and refills. Last seen 1 year ago. Colonoscopy in 2019 with adenomas but no surveillance due to age.  Has previously trialed Miralax, fiber, Linzess 72 mcg and 145 mcg. Linzess 290 mcg works best.  He is now taking this every other day. On days he doesn't take Linzess, he takes a stool softener. BM every 1-2 days. No straining. Stool soft. Working very well for him. No overt GI bleeding. Trying to eat healthier. Active in his home gym with lifting weights, rowing, biking. Eating fresh vegetables. No significant GERD. He did have indigestion after eating cucumbers with vinegar. No dysphagia. No daily GERD symptoms.   No concerning signs/symptoms.        Past Medical History:  Diagnosis Date   Chronic anticoagulation    Chronic back pain    Diabetes mellitus, type 2 (HCC)    no insulin   Edema    GERD (gastroesophageal reflux disease)    Hyperlipidemia    Hypertension    normal coronary angiography and normal EF in 12/98   Nodule of left lung    Granuloma in left upper lobe   Paroxysmal atrial fibrillation (HCC)    12/2005; sinus bradycardia secondary to medications   Sleep apnea     Past Surgical History:  Procedure Laterality Date   APPENDECTOMY  1998   COLONOSCOPY N/A 11/26/2012   NWG:NFAOZ 2 hemorrhoids. Pancolonic diverticulosis. Colonic polyps removed as described above. One hemostasis clip applied. TUBULAR ADENOMA. Surveillance Sept 2019.    COLONOSCOPY N/A 02/06/2018   two 3-5 mm cecal polyps, pancolonic diverticulosis. tubular adenoma. no surveillance due to age    INSERTION OF MESH N/A 01/27/2013   Procedure: INSERTION OF MESH;  Surgeon: Dalia Heading, MD;  Location: [facility];  Service: General;  Laterality: N/A;   POLYPECTOMY  02/06/2018   Procedure: POLYPECTOMY;  Surgeon: Corbin Ade, MD;  [facility];  Service: Endoscopy;;  (colon)   TONSILLECTOMY     as a child   UMBILICAL HERNIA REPAIR N/A 01/27/2013   Procedure: HERNIA REPAIR UMBILICAL ADULT;  Surgeon: Dalia Heading, MD;  Location: [facility];  Service: General;  Laterality: N/A;    Current Outpatient Medications  Medication Sig Dispense Refill   acetaminophen (TYLENOL) 500 MG tablet Take 500 mg by mouth every 6 (six) hours as needed (for pain.).     calcium carbonate (TUMS - DOSED IN MG ELEMENTAL CALCIUM) 500 MG chewable tablet Chew 1 tablet by mouth as needed for indigestion or heartburn.     chlorthalidone (HYGROTON) 25 MG tablet Take 0.5 tablets (12.5 mg total) by mouth every other day. 45 tablet 3   diltiazem (CARDIZEM) 30 MG tablet Take 1 tablet (30 mg total) by mouth daily as needed (Palpations). 30 tablet 11   doxazosin (CARDURA) 4 MG tablet TAKE THREE TABLETS BY MOUTH EVERY EVENING 270 tablet 3   glimepiride (AMARYL) 1 MG tablet Take 1 tablet by mouth daily.     hydrALAZINE (APRESOLINE) 100 MG tablet Take 1 tablet (100 mg total) by mouth 3 (  three) times daily. 90 tablet 1   Hydrocortisone (PREPARATION H EX) Place 1 application rectally daily as needed (irritation/discomfort).      LINZESS 290 MCG CAPS capsule TAKE ONE CAPSULE BY MOUTH every morning 90 capsule 3   lisinopril (ZESTRIL) 20 MG tablet Take 1 tablet (20 mg total) by mouth daily. 90 tablet 3   Magnesium Oxide 200 MG TABS Take 1 tablet (200 mg total) by mouth daily. 30 each 11   metFORMIN (GLUCOPHAGE) 1000 MG tablet Take 1 tablet by mouth 2 (two) times daily.     metoprolol tartrate (LOPRESSOR) 50 MG tablet Take 1 tablet (50 mg total) by mouth 2 (two) times daily. 180 tablet 3   Multiple Vitamin (MULTIVITAMIN)  tablet Take 1 tablet by mouth daily.     pantoprazole (PROTONIX) 40 MG tablet TAKE ONE TABLET BY MOUTH ONCE DAILY 30 MINUTES BEFORE BREAKFAST 90 tablet 3   Phenyleph-Chlorphen-Hydrocod (CHLORPHEN HD PO) Take by mouth as needed.     pravastatin (PRAVACHOL) 20 MG tablet Take 1 tablet (20 mg total) by mouth every evening. 90 tablet 3   Triamcinolone Acetonide (NASACORT ALLERGY 24HR NA) Place 1 spray into the nose daily as needed (congestion).     warfarin (COUMADIN) 5 MG tablet TAKE ONE TABLET BY MOUTH ONCE DAILY EXCEPT TAKE 1 AND 1/2 TABLETS ON TUESDAYS, THURSDAYS AND SATURDAYS AS DIRECTED 40 tablet 5   No current facility-administered medications for this visit.    Allergies as of 10/04/2022   (No Known Allergies)    Family History  Problem Relation Age of Onset   Heart disease Father    Heart disease Mother    Bronchitis Mother    Allergies Other        whole family   Colon cancer Neg Hx     Social History   Socioeconomic History   Marital status: Married    Spouse name: Not on file   Number of children: Not on file   Years of education: Not on file   Highest education level: Not on file  Occupational History   Occupation: Retired    Associate Professor: [employer]    Comment: Arboriculturist  Tobacco Use   Smoking status: Never   Smokeless tobacco: Never  Vaping Use   Vaping status: Never Used  Substance and Sexual Activity   Alcohol use: No    Alcohol/week: 0.0 standard drinks of alcohol   Drug use: Never   Sexual activity: Not on file  Other Topics Concern   Not on file  Social History Narrative   Not on file   Social Determinants of Health   Financial Resource Strain: Not on file  Food Insecurity: Not on file  Transportation Needs: Not on file  Physical Activity: Not on file  Stress: Not on file  Social Connections: Not on file  Intimate Partner Violence: Not on file     Review of Systems   Gen: Denies any fever, chills, fatigue, weight loss, lack  of appetite.  CV: Denies chest pain, heart palpitations, peripheral edema, syncope.  Resp: Denies shortness of breath at rest or with exertion. Denies wheezing or cough.  GI: Denies dysphagia or odynophagia. Denies jaundice, hematemesis, fecal incontinence. GU : Denies urinary burning, urinary frequency, urinary hesitancy MS: Denies joint pain, muscle weakness, cramps, or limitation of movement.  Derm: Denies rash, itching, dry skin Psych: Denies depression, anxiety, memory loss, and confusion Heme: Denies bruising, bleeding, and enlarged lymph nodes.   Physical Exam   BP  127/78 (BP Location: Right Arm, Patient Position: Sitting, Cuff Size: Large)   Pulse 67   Temp 97.7 F (36.5 C) (Oral)   Ht 6' (1.829 m)   Wt 229 lb 3.2 oz (104 kg)   SpO2 97%   BMI 31.09 kg/m  General:   Alert and oriented. Pleasant and cooperative. Well-nourished and well-developed.  Head:  Normocephalic and atraumatic. Eyes:  Without icterus Abdomen:  +BS, soft, non-tender and non-distended. No HSM noted. No guarding or rebound. No masses appreciated.  Rectal:  Deferred  Msk:  Symmetrical without gross deformities. Normal posture. Extremities:  Without edema. Neurologic:  Alert and  oriented x4;  grossly normal neurologically. Skin:  Intact without significant lesions or rashes. Psych:  Alert and cooperative. Normal mood and affect.   Assessment   Riley [name] is a 77 y.o. male presenting today with a history of constipation for routine follow-up and refills. Last seen 1 year ago. Colonoscopy in 2019 with adenomas but no surveillance due to age.  Constipation is well managed with novel approach of Linzess 290 mcg every other day and stool softener every other day. He has no concerning upper or lower GI signs/symptoms.  We will refill Linzess again for a year. He is to call with any concerns in interim.     PLAN    Linzess 290 mcg refills provided Return in 1 year   Gelene Mink, PhD,  [facility]

## 2022-10-04 NOTE — Patient Instructions (Signed)
I have refilled Linzess for you. You can continue taking it every other day, as that is working for you.  We will see you in 1 year or sooner if needed!  I enjoyed seeing you again today! I value our relationship and want to provide genuine, compassionate, and quality care. You may receive a survey regarding your visit with me, and I welcome your feedback! Thanks so much for taking the time to complete this. I look forward to seeing you again.      Gelene Mink, PhD, ANP-BC [facility]

## 2022-10-05 ENCOUNTER — Ambulatory Visit: Payer: Medicare Other | Attending: Cardiology | Admitting: *Deleted

## 2022-10-05 ENCOUNTER — Ambulatory Visit: Payer: Medicare Other | Admitting: Gastroenterology

## 2022-10-05 DIAGNOSIS — I480 Paroxysmal atrial fibrillation: Secondary | ICD-10-CM | POA: Diagnosis not present

## 2022-10-05 NOTE — Progress Notes (Signed)
Pt in office for EKG

## 2022-10-13 ENCOUNTER — Ambulatory Visit (HOSPITAL_COMMUNITY)
Admission: RE | Admit: 2022-10-13 | Discharge: 2022-10-13 | Disposition: A | Discharge status: Home / Self Care | Payer: Medicare Other | Source: Ambulatory Visit | Attending: Internal Medicine | Admitting: Internal Medicine

## 2022-10-13 DIAGNOSIS — I480 Paroxysmal atrial fibrillation: Secondary | ICD-10-CM | POA: Insufficient documentation

## 2022-10-13 LAB — ECHOCARDIOGRAM COMPLETE
AV Peak grad: 7.8 mmHg
Ao pk vel: 1.4 m/s
Area-P 1/2: 4.17 cm2
S' Lateral: 3.8 cm

## 2022-10-17 ENCOUNTER — Ambulatory Visit: Payer: Medicare Other | Attending: Cardiology | Admitting: *Deleted

## 2022-10-17 DIAGNOSIS — Z5181 Encounter for therapeutic drug level monitoring: Secondary | ICD-10-CM | POA: Diagnosis not present

## 2022-10-17 DIAGNOSIS — I480 Paroxysmal atrial fibrillation: Secondary | ICD-10-CM

## 2022-10-17 LAB — POCT INR: INR: 3.3 — AB (ref 2.0–3.0)

## 2022-10-17 NOTE — Patient Instructions (Signed)
Take warfarin 1/2 tablet tonight then continue 1 tablet daily except 1 1/2 tablets Tuesdays, Thursdays and Saturdays  Recheck in 6 wks

## 2022-10-18 ENCOUNTER — Telehealth: Payer: Self-pay | Admitting: Internal Medicine

## 2022-10-18 MED ORDER — FUROSEMIDE 20 MG PO TABS: 20.0000 mg | ORAL_TABLET | Freq: Every day | ORAL | 3 refills | Status: DC

## 2022-10-18 NOTE — Telephone Encounter (Signed)
Patient notified and verbalized understanding. 

## 2022-10-18 NOTE — Telephone Encounter (Signed)
-----   Message from Vishnu P Mallipeddi sent at 10/17/2022  5:20 PM EDT ----- Low normal pumping function of the heart with LVEF 50 to 55%, RV function mildly reduced and CVP 15 mmHg.  Volume overloaded based on the echocardiogram.  Start p.o. Lasix 20 mg as needed for SOB or leg swelling.

## 2022-10-18 NOTE — Telephone Encounter (Signed)
Patient's spouse is returning call in regards to Echo results. Patient is going to be outside mowing around 1 PM, if the patient misses the call she will call back as soon as she can.

## 2022-10-30 ENCOUNTER — Telehealth: Payer: Self-pay

## 2022-10-30 ENCOUNTER — Telehealth: Payer: Self-pay | Admitting: Gastroenterology

## 2022-10-30 ENCOUNTER — Telehealth: Payer: Self-pay | Admitting: Internal Medicine

## 2022-10-30 MED ORDER — METOPROLOL TARTRATE 50 MG PO TABS: 50.0000 mg | ORAL_TABLET | Freq: Two times a day (BID) | ORAL | 2 refills | Status: DC

## 2022-10-30 NOTE — Telephone Encounter (Signed)
Pt's pharmacy has changed to [facility] from now on

## 2022-10-30 NOTE — Telephone Encounter (Signed)
Wife left a message saying she wanted to speak to the nurse about a medication and where it should be sent to in the future

## 2022-10-30 NOTE — Telephone Encounter (Signed)
*  STAT* If patient is at the pharmacy, call can be transferred to refill team.   1. Which medications need to be refilled? (please list name of each medication and dose if known)  new prescription for Metoprolol- changing pharmacy- please change in his chart that he is changing to [facility]-   2. Would you like to learn more about the convenience, safety, & potential cost savings by using the [facility]?    3. Are you open to using the [facility] (Type [facility].    4. Which pharmacy/location (including street and city if local pharmacy) is medication to be sent to?   [facility]   5. Do they need a 30 day or 90 day supply? 90 days and refills

## 2022-10-30 NOTE — Telephone Encounter (Signed)
Rx sent to [facility] as requested

## 2022-11-28 ENCOUNTER — Ambulatory Visit: Payer: Medicare Other | Attending: Cardiology | Admitting: *Deleted

## 2022-11-28 DIAGNOSIS — I480 Paroxysmal atrial fibrillation: Secondary | ICD-10-CM | POA: Diagnosis not present

## 2022-11-28 DIAGNOSIS — Z5181 Encounter for therapeutic drug level monitoring: Secondary | ICD-10-CM

## 2022-11-28 LAB — POCT INR: INR: 2.8 (ref 2.0–3.0)

## 2022-11-28 NOTE — Patient Instructions (Signed)
Continue warfarin 1 tablet daily except 1 1/2 tablets Tuesdays, Thursdays and Saturdays  Recheck in 6 wks

## 2022-12-08 ENCOUNTER — Encounter: Payer: Self-pay | Admitting: Primary Care

## 2022-12-08 ENCOUNTER — Ambulatory Visit: Payer: Medicare Other | Admitting: Primary Care

## 2022-12-08 VITALS — BP 126/80 | HR 82 | Ht 72.0 in | Wt 229.0 lb

## 2022-12-08 DIAGNOSIS — J3489 Other specified disorders of nose and nasal sinuses: Secondary | ICD-10-CM | POA: Diagnosis not present

## 2022-12-08 DIAGNOSIS — J302 Other seasonal allergic rhinitis: Secondary | ICD-10-CM

## 2022-12-08 DIAGNOSIS — G4733 Obstructive sleep apnea (adult) (pediatric): Secondary | ICD-10-CM | POA: Diagnosis not present

## 2022-12-08 MED ORDER — AZELASTINE HCL 0.1 % NA SOLN: 1.0000 | Freq: Two times a day (BID) | NASAL | 1 refills | Status: DC

## 2022-12-08 NOTE — Progress Notes (Signed)
@Patient  ID: Riley [name], male    DOB: 07-08-1945, 77 y.o.   MRN: [id number]  Chief Complaint  Patient presents with   Follow-up    Referring provider: Elfredia Nevins, MD  HPI: 77 year old male.  Past medical history significant for A-fib and OSA.  Patient of Dr. Vassie Loll in office on 11/21/2021.  12/08/2022 Presents today for annual office visit for OSA on.  He feels he is doing alright. Dealing with sinus issues, having sneezing symptoms and sinus headache. He is taking xyzal OTC daily which has helped. He feels is nasal passages are very try. He has been using saline nasal spray several times a day. He reports getting solid sleep the last three nights until  7am. He works third shift. He goes to bed around 12-12:30am. He starts his day around 8-8:30am. The uses full face mask, he does experience airleaks. He got a new mask 2 years ago. He usually wakes up to use the restroom around 2-2:30am and will at times have a hard time fallings back to sleep. He takes lasix in the morning.   Airview compliance 11/08/2022 - 12/07/2022 Usage days 27 days (97%) 4 hours Average usage 5 hours 28 minutes Pressure 12-15/2 oh (14.3 cm H2O-95%) Air leaks 42.8 L/min (95%) Apnea index-central 36.8/obstructive 2.1/unknown 3.7 AHI 43.5 an hour    Significant tests/ events reviewed   2014 PSG >>TST 128 mins - only 2-3 hrs of sleep  -RDI 37/h Started on autoCPAP 5-15  >> changed to 11 cm   No Known Allergies  Immunization History  Administered Date(s) Administered   Influenza Whole 12/06/2011   Influenza, High Dose Seasonal PF 12/03/2016, 01/02/2018   Influenza-Unspecified 01/15/2014   Moderna Sars-Covid-2 Vaccination 05/06/2019, 06/03/2019   Pneumococcal Polysaccharide-23 05/01/2012    Past Medical History:  Diagnosis Date   Chronic anticoagulation    Chronic back pain    Diabetes mellitus, type 2 (HCC)    no insulin   Edema    GERD (gastroesophageal reflux disease)    Hyperlipidemia     Hypertension    normal coronary angiography and normal EF in 12/98   Nodule of left lung    Granuloma in left upper lobe   Paroxysmal atrial fibrillation (HCC)    12/2005; sinus bradycardia secondary to medications   Sleep apnea     Tobacco History: Social History   Tobacco Use  Smoking Status Never  Smokeless Tobacco Never   Counseling given: Not Answered   Outpatient Medications Prior to Visit  Medication Sig Dispense Refill   acetaminophen (TYLENOL) 500 MG tablet Take 500 mg by mouth every 6 (six) hours as needed (for pain.).     calcium carbonate (TUMS - DOSED IN MG ELEMENTAL CALCIUM) 500 MG chewable tablet Chew 1 tablet by mouth as needed for indigestion or heartburn.     diltiazem (CARDIZEM) 30 MG tablet Take 1 tablet (30 mg total) by mouth daily as needed (Palpations). 30 tablet 11   doxazosin (CARDURA) 4 MG tablet TAKE THREE TABLETS BY MOUTH EVERY EVENING 270 tablet 3   furosemide (LASIX) 20 MG tablet Take 1 tablet (20 mg total) by mouth daily. 90 tablet 3   glimepiride (AMARYL) 1 MG tablet Take 1 tablet by mouth daily.     hydrALAZINE (APRESOLINE) 100 MG tablet Take 1 tablet (100 mg total) by mouth 3 (three) times daily. 90 tablet 1   Hydrocortisone (PREPARATION H EX) Place 1 application rectally daily as needed (irritation/discomfort).      linaclotide (  LINZESS) 290 MCG CAPS capsule Take 1 capsule (290 mcg total) by mouth every morning. 90 capsule 3   Magnesium Oxide 200 MG TABS Take 1 tablet (200 mg total) by mouth daily. 30 each 11   metFORMIN (GLUCOPHAGE) 1000 MG tablet Take 1 tablet by mouth 2 (two) times daily.     metoprolol tartrate (LOPRESSOR) 50 MG tablet Take 1 tablet (50 mg total) by mouth 2 (two) times daily. 180 tablet 2   Multiple Vitamin (MULTIVITAMIN) tablet Take 1 tablet by mouth daily.     pantoprazole (PROTONIX) 40 MG tablet TAKE ONE TABLET BY MOUTH ONCE DAILY 30 MINUTES BEFORE BREAKFAST 90 tablet 3   Phenyleph-Chlorphen-Hydrocod (CHLORPHEN HD PO) Take  by mouth as needed.     pravastatin (PRAVACHOL) 20 MG tablet Take 1 tablet (20 mg total) by mouth every evening. 90 tablet 3   Triamcinolone Acetonide (NASACORT ALLERGY 24HR NA) Place 1 spray into the nose daily as needed (congestion).     warfarin (COUMADIN) 5 MG tablet TAKE ONE TABLET BY MOUTH ONCE DAILY EXCEPT TAKE 1 AND 1/2 TABLETS ON TUESDAYS, THURSDAYS AND SATURDAYS AS DIRECTED 40 tablet 5   chlorthalidone (HYGROTON) 25 MG tablet Take 0.5 tablets (12.5 mg total) by mouth every other day. 45 tablet 3   lisinopril (ZESTRIL) 20 MG tablet Take 1 tablet (20 mg total) by mouth daily. 90 tablet 3   No facility-administered medications prior to visit.    Review of Systems  Review of Systems  Constitutional: Negative.  Negative for fatigue.  HENT:  Positive for congestion and postnasal drip.   Respiratory: Negative.  Negative for cough, shortness of breath and wheezing.   Psychiatric/Behavioral:  Positive for sleep disturbance.     Physical Exam  BP 126/80   Pulse 82   Ht 6' (1.829 m)   Wt 229 lb (103.9 kg)   SpO2 93% Comment: RA  BMI 31.06 kg/m  Physical Exam Constitutional:      Appearance: Normal appearance.  HENT:     Head: Normocephalic and atraumatic.     Nose: Nose normal. No congestion.     Mouth/Throat:     Mouth: Mucous membranes are moist.     Pharynx: Posterior oropharyngeal erythema present.  Cardiovascular:     Rate and Rhythm: Normal rate and regular rhythm.  Musculoskeletal:        General: Normal range of motion.  Skin:    General: Skin is warm and dry.  Neurological:     General: No focal deficit present.     Mental Status: He is alert and oriented to person, place, and time. Mental status is at baseline.  Psychiatric:        Mood and Affect: Mood normal.        Behavior: Behavior normal.        Thought Content: Thought content normal.        Judgment: Judgment normal.      Lab Results:  CBC    Component Value Date/Time   WBC 6.0 11/01/2016  0849   RBC 4.77 11/01/2016 0849   HGB 14.4 11/01/2016 0849   HCT 43.6 11/01/2016 0849   PLT 184 11/01/2016 0849   MCV 91.4 11/01/2016 0849   MCH 30.2 11/01/2016 0849   MCHC 33.0 11/01/2016 0849   RDW 14.2 11/01/2016 0849   LYMPHSABS 1,440 11/01/2016 0849   MONOABS 720 11/01/2016 0849   EOSABS 60 11/01/2016 0849   BASOSABS 60 11/01/2016 0849    BMET  Component Value Date/Time   NA 137 04/24/2017 1158   K 3.5 04/24/2017 1158   CL 100 (L) 04/24/2017 1158   CO2 26 04/24/2017 1158   GLUCOSE 141 (H) 04/24/2017 1158   BUN 23 (H) 04/24/2017 1158   CREATININE 1.08 04/24/2017 1158   CREATININE 1.17 11/01/2016 0849   CALCIUM 9.5 04/24/2017 1158   GFRNONAA >60 04/24/2017 1158   GFRAA >60 04/24/2017 1158    BNP No results found for: "BNP"  ProBNP    Component Value Date/Time   PROBNP 262.9 (H) 04/29/2012 1022    Imaging: No results found.   Assessment & Plan:   OSA (obstructive sleep apnea) Patient reports sleeping well the last 3 nights. Airview download form 11/08/22-12/07/22 does show significant residual apnea and a large amount of air leaks.  He uses a fullface mask, he has not replaced mask in approximately 2 years.  Current pressure settings 12 to 15 cm H2O; AHI 43.5 an hour.  He could be having emergent central apneas due to pressure settings.  We will adjust auto settings 10-18cm h20 and have recommended patient replace CPAP mask.  DME order placed for CPAP supplies to be renewed.  If patient continues to experience a large amount of air leaks and/or residual apneas, recommend in-lab CPAP titration study.  Recommendations: - Continue CPAP nightly 4-6 hours  - We are adjusting pressure settings and recommend you replaced your CPAP mask  - If still having airleaks and residual apneas at follow up, we will need to get CPAP titration study in lab   Orders: Adjust CPAP pressure settings 10-18cm h20  Renew CPAP supplies- please provide patient replacement full face CPAP  mask (ordered)  Follow-up:  3-4 months with Beth NP / in office (if able virtual Friday afternoon ok)  Nasal dryness - Patient reports symptoms of nasal dryness and some congestion.  Recommend he try Astelin nasal spray twice daily and Ayr nasal spray/gel for moisture.  Advised he temporarily try holding Ocean saline as this could be contributing to nasal dryness.   Seasonal allergies - Continue Xyal OTC daily    Glenford Bayley, NP 12/08/2022

## 2022-12-08 NOTE — Assessment & Plan Note (Signed)
Patient reports sleeping well the last 3 nights. Airview download form 11/08/22-12/07/22 does show significant residual apnea and a large amount of air leaks.  He uses a fullface mask, he has not replaced mask in approximately 2 years.  Current pressure settings 12 to 15 cm H2O; AHI 43.5 an hour.  He could be having emergent central apneas due to pressure settings.  We will adjust auto settings 10-18cm h20 and have recommended patient replace CPAP mask.  DME order placed for CPAP supplies to be renewed.  If patient continues to experience a large amount of air leaks and/or residual apneas, recommend in-lab CPAP titration study.  Recommendations: - Continue CPAP nightly 4-6 hours  - We are adjusting pressure settings and recommend you replaced your CPAP mask  - If still having airleaks and residual apneas at follow up, we will need to get CPAP titration study in lab   Orders: Adjust CPAP pressure settings 10-18cm h20  Renew CPAP supplies- please provide patient replacement full face CPAP mask (ordered)  Follow-up:  3-4 months with Beth NP / in office (if able virtual Friday afternoon ok)

## 2022-12-08 NOTE — Assessment & Plan Note (Signed)
-   Continue Xyal OTC daily

## 2022-12-08 NOTE — Assessment & Plan Note (Signed)
-   Patient reports symptoms of nasal dryness and some congestion.  Recommend he try Astelin nasal spray twice daily and Ayr nasal spray/gel for moisture.  Advised he temporarily try holding Ocean saline as this could be contributing to nasal dryness.

## 2022-12-08 NOTE — Patient Instructions (Addendum)
Recommendations: - Continue CPAP nightly 4-6 hours  - We are adjusting pressure settings and recommend you replaced your CPAP mask  - If still having airleaks and residual apneas at follow up, we will need to get CPAP titration study in lab  - Try Astelin nasal spray twice daily  - Temporarily stop saline nasal spray/ try over the counter AYR nasal spray/gel for nasal dryness  (ask pharmacist to help you find if having trouble)   Orders: Adjust CPAP pressure settings 10-18cm h20  Renew CPAP supplies- please provide patient replacement full face CPAP mask (ordered)  Follow-up:  3-4 months with Beth NP / in office (if able virtual Friday afternoon ok)

## 2022-12-19 ENCOUNTER — Ambulatory Visit: Payer: Medicare Other | Admitting: Internal Medicine

## 2022-12-29 ENCOUNTER — Telehealth: Payer: Self-pay | Admitting: *Deleted

## 2022-12-29 NOTE — Telephone Encounter (Signed)
Spoke with wife Stanton Kidney. Results given. Wife states no refills needed at this time.

## 2022-12-29 NOTE — Telephone Encounter (Signed)
-----   Message from Vishnu P Mallipeddi sent at 12/29/2022 11:18 AM EDT ----- Afib rate controlled, continue metoprolol at the current dose. Refill. ----- Message ----- From: Kerney Elbe, LPN Sent: 03/09/863   4:18 PM EDT To: Marjo Bicker, MD

## 2023-01-09 ENCOUNTER — Ambulatory Visit: Payer: Medicare Other | Attending: Cardiology | Admitting: *Deleted

## 2023-01-09 DIAGNOSIS — I480 Paroxysmal atrial fibrillation: Secondary | ICD-10-CM | POA: Diagnosis not present

## 2023-01-09 DIAGNOSIS — Z5181 Encounter for therapeutic drug level monitoring: Secondary | ICD-10-CM

## 2023-01-09 LAB — POCT INR: INR: 3.6 — AB (ref 2.0–3.0)

## 2023-01-09 NOTE — Patient Instructions (Signed)
Hold warfarin tonight then resume 1 tablet daily except 1 1/2 tablets Tuesdays, Thursdays and Saturdays  Recheck in 3 wks

## 2023-01-30 ENCOUNTER — Ambulatory Visit: Payer: Medicare Other | Attending: Cardiology | Admitting: *Deleted

## 2023-01-30 DIAGNOSIS — Z5181 Encounter for therapeutic drug level monitoring: Secondary | ICD-10-CM | POA: Diagnosis not present

## 2023-01-30 DIAGNOSIS — I480 Paroxysmal atrial fibrillation: Secondary | ICD-10-CM

## 2023-01-30 LAB — POCT INR: INR: 3.2 — AB (ref 2.0–3.0)

## 2023-01-30 NOTE — Patient Instructions (Signed)
Decrease warfarin to 1 tablet daily except 1 1/2 tablets Saturdays  Recheck in 3 wks

## 2023-02-12 ENCOUNTER — Other Ambulatory Visit (HOSPITAL_COMMUNITY): Payer: Self-pay | Admitting: Internal Medicine

## 2023-02-12 DIAGNOSIS — M48062 Spinal stenosis, lumbar region with neurogenic claudication: Secondary | ICD-10-CM

## 2023-02-12 DIAGNOSIS — M4306 Spondylolysis, lumbar region: Secondary | ICD-10-CM

## 2023-02-20 ENCOUNTER — Ambulatory Visit: Payer: Medicare Other | Attending: Cardiology | Admitting: *Deleted

## 2023-02-20 DIAGNOSIS — I480 Paroxysmal atrial fibrillation: Secondary | ICD-10-CM | POA: Diagnosis not present

## 2023-02-20 DIAGNOSIS — Z5181 Encounter for therapeutic drug level monitoring: Secondary | ICD-10-CM

## 2023-02-20 LAB — POCT INR: INR: 3 (ref 2.0–3.0)

## 2023-02-20 NOTE — Patient Instructions (Signed)
Continue warfarin 1 tablet daily except 1 1/2 tablets Saturdays  Recheck in 4 wks

## 2023-03-05 ENCOUNTER — Encounter (INDEPENDENT_AMBULATORY_CARE_PROVIDER_SITE_OTHER): Payer: Self-pay | Admitting: Otolaryngology

## 2023-03-09 ENCOUNTER — Telehealth: Payer: Medicare Other | Admitting: Primary Care

## 2023-03-20 ENCOUNTER — Ambulatory Visit: Payer: Medicare Other | Attending: Cardiology | Admitting: *Deleted

## 2023-03-20 DIAGNOSIS — Z5181 Encounter for therapeutic drug level monitoring: Secondary | ICD-10-CM

## 2023-03-20 DIAGNOSIS — I480 Paroxysmal atrial fibrillation: Secondary | ICD-10-CM | POA: Diagnosis not present

## 2023-03-20 LAB — POCT INR: INR: 2.5 (ref 2.0–3.0)

## 2023-03-20 NOTE — Patient Instructions (Signed)
 Continue warfarin 1 tablet daily except 1 1/2 tablets Saturdays  Recheck in 6 wks

## 2023-03-28 ENCOUNTER — Other Ambulatory Visit: Payer: Self-pay | Admitting: Internal Medicine

## 2023-03-28 NOTE — Telephone Encounter (Signed)
Refill request for warfarin:  Last INR was 2.5 on 03/20/23 Next INR due 05/01/23 LOV was 09/04/22  Refill approved.

## 2023-04-04 ENCOUNTER — Ambulatory Visit: Payer: Medicare Other | Attending: Internal Medicine | Admitting: Internal Medicine

## 2023-04-04 ENCOUNTER — Encounter: Payer: Self-pay | Admitting: Internal Medicine

## 2023-04-04 VITALS — BP 134/84 | HR 73 | Ht 72.0 in | Wt 231.2 lb

## 2023-04-04 DIAGNOSIS — I4819 Other persistent atrial fibrillation: Secondary | ICD-10-CM

## 2023-04-04 DIAGNOSIS — I480 Paroxysmal atrial fibrillation: Secondary | ICD-10-CM | POA: Diagnosis not present

## 2023-04-04 NOTE — Patient Instructions (Signed)
Medication Instructions:  Your physician recommends that you continue on your current medications as directed. Please refer to the Current Medication list given to you today.   Labwork: None today  Testing/Procedures: None today  Follow-Up: 1 year  Any Other Special Instructions Will Be Listed Below (If Applicable).  If you need a refill on your cardiac medications before your next appointment, please call your pharmacy.

## 2023-04-04 NOTE — Progress Notes (Signed)
Cardiology Office Note  Date: 04/04/2023   ID: Riley, [name] Jul 27, 1945, MRN [id number]  PCP:  Elfredia Nevins, MD  Cardiologist:  Marjo Bicker, MD Electrophysiologist:  None    History of Present Illness: Riley [name] is a 78 y.o. male known to have HTN, DM 2, HLD, paroxysmal A-fib (diagnosed in 2007, sinus bradycardia secondary to medications), OSA on CPAP is here for follow-up visit.  No symptoms overall doing great.  Compliant with medications.  Past Medical History:  Diagnosis Date   Chronic anticoagulation    Chronic back pain    Diabetes mellitus, type 2 (HCC)    no insulin   Edema    GERD (gastroesophageal reflux disease)    Hyperlipidemia    Hypertension    normal coronary angiography and normal EF in 12/98   Nodule of left lung    Granuloma in left upper lobe   Paroxysmal atrial fibrillation (HCC)    12/2005; sinus bradycardia secondary to medications   Sleep apnea     Past Surgical History:  Procedure Laterality Date   APPENDECTOMY  1998   COLONOSCOPY N/A 11/26/2012   EPP:IRJJO 2 hemorrhoids. Pancolonic diverticulosis. Colonic polyps removed as described above. One hemostasis clip applied. TUBULAR ADENOMA. Surveillance Sept 2019.    COLONOSCOPY N/A 02/06/2018   two 3-5 mm cecal polyps, pancolonic diverticulosis. tubular adenoma. no surveillance due to age   INSERTION OF MESH N/A 01/27/2013   Procedure: INSERTION OF MESH;  Surgeon: Dalia Heading, MD;  Location: [facility];  Service: General;  Laterality: N/A;   POLYPECTOMY  02/06/2018   Procedure: POLYPECTOMY;  Surgeon: Corbin Ade, MD;  Location: [facility];  Service: Endoscopy;;  (colon)   TONSILLECTOMY     as a child   UMBILICAL HERNIA REPAIR N/A 01/27/2013   Procedure: HERNIA REPAIR UMBILICAL ADULT;  Surgeon: Dalia Heading, MD;  Location: [facility];  Service: General;  Laterality: N/A;    Current Outpatient Medications  Medication Sig Dispense Refill   acetaminophen (TYLENOL) 500 MG  tablet Take 500 mg by mouth every 6 (six) hours as needed (for pain.).     azelastine (ASTELIN) 0.1 % nasal spray Place 1 spray into both nostrils 2 (two) times daily. Use in each nostril as directed 30 mL 1   calcium carbonate (TUMS - DOSED IN MG ELEMENTAL CALCIUM) 500 MG chewable tablet Chew 1 tablet by mouth as needed for indigestion or heartburn.     chlorthalidone (HYGROTON) 25 MG tablet Take 0.5 tablets (12.5 mg total) by mouth every other day. 45 tablet 3   diltiazem (CARDIZEM) 30 MG tablet Take 1 tablet (30 mg total) by mouth daily as needed (Palpations). 30 tablet 11   doxazosin (CARDURA) 4 MG tablet TAKE THREE TABLETS BY MOUTH EVERY EVENING 270 tablet 3   furosemide (LASIX) 20 MG tablet Take 1 tablet (20 mg total) by mouth daily. 90 tablet 3   glimepiride (AMARYL) 1 MG tablet Take 1 tablet by mouth daily.     hydrALAZINE (APRESOLINE) 100 MG tablet Take 1 tablet (100 mg total) by mouth 3 (three) times daily. 90 tablet 1   Hydrocortisone (PREPARATION H EX) Place 1 application rectally daily as needed (irritation/discomfort).      linaclotide (LINZESS) 290 MCG CAPS capsule Take 1 capsule (290 mcg total) by mouth every morning. 90 capsule 3   lisinopril (ZESTRIL) 20 MG tablet Take 1 tablet (20 mg total) by mouth daily. 90 tablet 3   Magnesium Oxide  200 MG TABS Take 1 tablet (200 mg total) by mouth daily. 30 each 11   metFORMIN (GLUCOPHAGE) 1000 MG tablet Take 1 tablet by mouth 2 (two) times daily.     metoprolol tartrate (LOPRESSOR) 50 MG tablet Take 1 tablet (50 mg total) by mouth 2 (two) times daily. 180 tablet 2   Multiple Vitamin (MULTIVITAMIN) tablet Take 1 tablet by mouth daily.     pantoprazole (PROTONIX) 40 MG tablet TAKE ONE TABLET BY MOUTH ONCE DAILY 30 MINUTES BEFORE BREAKFAST 90 tablet 3   Phenyleph-Chlorphen-Hydrocod (CHLORPHEN HD PO) Take by mouth as needed.     pravastatin (PRAVACHOL) 20 MG tablet Take 1 tablet (20 mg total) by mouth every evening. 90 tablet 3   Triamcinolone  Acetonide (NASACORT ALLERGY 24HR NA) Place 1 spray into the nose daily as needed (congestion).     warfarin (COUMADIN) 5 MG tablet TAKE 1 TABLET BY MOUTH ONCE DAILY, EXCEPT TAKE 1 AND 1/2 TABLETS ON SATURDAYS OR AS DIRECTED. 40 tablet 5   No current facility-administered medications for this visit.   Allergies:  Patient has no known allergies.   Social History: The patient  reports that he has never smoked. He has never used smokeless tobacco. He reports that he does not drink alcohol and does not use drugs.   Family History: The patient's family history includes Allergies in an other family member; Bronchitis in his mother; Heart disease in his father and mother.   ROS:  Please see the history of present illness. Otherwise, complete review of systems is positive for none.  All other systems are reviewed and negative.   Physical Exam: VS:  There were no vitals taken for this visit., BMI There is no height or weight on file to calculate BMI.  Wt Readings from Last 3 Encounters:  12/08/22 229 lb (103.9 kg)  10/04/22 229 lb 3.2 oz (104 kg)  09/20/22 233 lb (105.7 kg)    General: Patient appears comfortable at rest. HEENT: Conjunctiva and lids normal, oropharynx clear with moist mucosa. Neck: Supple, no elevated JVP or carotid bruits, no thyromegaly. Lungs: Clear to auscultation, nonlabored breathing at rest. Cardiac: Regular rate and rhythm, no S3 or significant systolic murmur, no pericardial rub. Abdomen: Soft, nontender, no hepatomegaly, bowel sounds present, no guarding or rebound. Extremities: No pitting edema, distal pulses 2+. Skin: Warm and dry. Musculoskeletal: No kyphosis. Neuropsychiatric: Alert and oriented x3, affect grossly appropriate.  Recent Labwork: No results found for requested labs within last 365 days.     Component Value Date/Time   CHOL 133 11/01/2016 0849   TRIG 125 11/01/2016 0849   TRIG 92 and 07/30/2006 0000   HDL 40 (L) 11/01/2016 0849   CHOLHDL 3.3  11/01/2016 0849   VLDL 25 11/01/2016 0849   LDLCALC 68 11/01/2016 0849   LDLCALC 48 07/30/2006 0000    Assessment and Plan:  # Persistent A-fib -Diagnosed in 2007, has been in NSR since.  He is back in atrial fibrillation since July 2024 with no symptoms.  Continue metoprolol tartrate 50 mg twice daily, diltiazem 30 mg daily as needed and Coumadin 5 mg with goal INR between 2 and 3.  # HTN, controlled -Continue current antihypertensives, hydralazine 100 mg 3 times daily, lisinopril 20 mg once daily, metoprolol tartrate 50 mg twice daily, chlorthalidone 12.5 mg every other day and doxazosin 4 mg daily.  # HLD, unknown values -Continue pravastatin 20 mg nightly, goal LDL less than 100.  # OSA on CPAP -Continue CPAP  Medication Adjustments/Labs and Tests Ordered: Current medicines are reviewed at length with the patient today.  Concerns regarding medicines are outlined above.   Tests Ordered: Orders Placed This Encounter  Procedures   EKG 12-Lead    Medication Changes: No orders of the defined types were placed in this encounter.   Disposition:  Follow up 1 year  Signed, Mahlon Gabrielle Verne Spurr, MD, 04/04/2023 10:59 AM    [facility] at [facility] [address] [place], [place] [address]

## 2023-04-08 DIAGNOSIS — I4819 Other persistent atrial fibrillation: Secondary | ICD-10-CM | POA: Insufficient documentation

## 2023-04-13 ENCOUNTER — Telehealth: Payer: Medicare Other | Admitting: Primary Care

## 2023-04-13 DIAGNOSIS — G4733 Obstructive sleep apnea (adult) (pediatric): Secondary | ICD-10-CM

## 2023-04-13 NOTE — Progress Notes (Signed)
 Virtual Visit via Video Note  I connected with Riley [name] on 04/13/23 at  3:30 PM EST by a video enabled telemedicine application and verified that I am speaking with the correct person using two identifiers.  Location: Patient: Home Provider: Office   I discussed the limitations of evaluation and management by telemedicine and the availability of in person appointments. The patient expressed understanding and agreed to proceed.  History of Present Illness: 78 year old male.  Past medical history significant for A-fib and OSA.  Patient of Dr. Jude in office on 11/21/2021.  Previous [facility] pulmonary encounter: 12/08/2022 Presents today for annual office visit for OSA on.  He feels he is doing alright. Dealing with sinus issues, having sneezing symptoms and sinus headache. He is taking xyzal OTC daily which has helped. He feels is nasal passages are very try. He has been using saline nasal spray several times a day. He reports getting solid sleep the last three nights until  7am. He works third shift. He goes to bed around 12-12:30am. He starts his day around 8-8:30am. The uses full face mask, he does experience airleaks. He got a new mask 2 years ago. He usually wakes up to use the restroom around 2-2:30am and will at times have a hard time fallings back to sleep. He takes lasix  in the morning.   Airview compliance 11/08/2022 - 12/07/2022 Usage days 27 days (97%) 4 hours Average usage 5 hours 28 minutes Pressure 12-15/2 oh (14.3 cm H2O-95%) Air leaks 42.8 L/min (95%) Apnea index-central 36.8/obstructive 2.1/unknown 3.7 AHI 43.5 an hour   04/13/2023- Interim hx  Discussed the use of AI scribe software for clinical note transcription with the patient, who gave verbal consent to proceed.  Patient contacted today for virtual visit/ OSA on CPAP.  Patient had sleep study in 2014, RDI 37 events per hour.  During patient's last visit in October his pressure settings were adjusted due to significant amount  of breakthrough apneas.  Patient had not changed his mask in over 2 years.  Current pressure setting 10 to 18 cm H2O.  Patient has having moderate amount of air leaks.  Continues to have significant amount of breakthrough apneas, primarily central apneas.  He obtained a new mask, which has improved air leaks, although it can become loose if it shifts during the night. No waking up gasping for air, choking, or snoring, but he mentions needing to make bathroom trips around 5 or 6 AM. He feels that the CPAP helps him breathe better, especially with his sinus issues, and generally reports sleeping well, although he occasionally experiences nasal passage shrinkage that affects his breathing.  Airview download 03/13/2023 - 04/11/2023 Usage days 29/30 days (97%); 26 days (87%) greater than 4 hours Average usage 5 hours 5 minutes Pressure 10 to 18 cm H2O (15.4 cm H2O-95%) Air leaks 29.4 L/min (95%) AHI 48.7   Significant tests/ events reviewed   2014 PSG >>TST 128 mins - only 2-3 hrs of sleep  -RDI 37/h Started on autoCPAP 5-15  >> changed to 11 cm    Observations/Objective:  -Unable to connect to video visit, changed to televisit  -Able to speak in full sentences without overt shortness of breath/wheezing or cough   Assessment and Plan:  1. OSA (obstructive sleep apnea) (Primary) - Cpap titration; Future     Sleep Apnea Patient reports benefit from current CPAP machine and mask, with improved breathing and reduced air leaks. However, CPAP download data suggests ongoing apneic events. No reported  symptoms of gasping, choking, or snoring. Current pressure 10 to 18 cm H2O (15.4 cm H2O-95%); Residual AHI 48.7 (primarily central apneas). Moderate amount of airleaks. -Schedule a CPAP titration study in the lab to re-evaluate pressure settings and control of apneic events. -Continue current CPAP use nightly until the study.  Medication Reconciliation Potentially some discrepancies noted in patient's  reported medications and those listed in the chart, particularly regarding blood pressure medications. -At next in-person visit, patient to bring all current medications for review and reconciliation.   Follow Up Instructions:   3 months with Dr. Alva or Landry NP  I discussed the assessment and treatment plan with the patient. The patient was provided an opportunity to ask questions and all were answered. The patient agreed with the plan and demonstrated an understanding of the instructions.   The patient was advised to call back or seek an in-person evaluation if the symptoms worsen or if the condition fails to improve as anticipated.  I provided 22 minutes of non-face-to-face time during this encounter.   Almarie LELON Ferrari, NP

## 2023-05-01 ENCOUNTER — Ambulatory Visit: Payer: Medicare Other | Attending: Cardiology | Admitting: *Deleted

## 2023-05-01 DIAGNOSIS — I480 Paroxysmal atrial fibrillation: Secondary | ICD-10-CM

## 2023-05-01 DIAGNOSIS — Z5181 Encounter for therapeutic drug level monitoring: Secondary | ICD-10-CM | POA: Diagnosis not present

## 2023-05-01 LAB — POCT INR: INR: 2.4 (ref 2.0–3.0)

## 2023-05-01 NOTE — Patient Instructions (Signed)
 Continue warfarin 1 tablet daily except 1 1/2 tablets Saturdays  Recheck in 6 wks

## 2023-06-07 ENCOUNTER — Ambulatory Visit (HOSPITAL_BASED_OUTPATIENT_CLINIC_OR_DEPARTMENT_OTHER): Payer: Medicare Other | Attending: Primary Care | Admitting: Internal Medicine

## 2023-06-07 ENCOUNTER — Telehealth: Payer: Self-pay | Admitting: Primary Care

## 2023-06-07 ENCOUNTER — Telehealth: Payer: Self-pay | Admitting: *Deleted

## 2023-06-07 DIAGNOSIS — G4733 Obstructive sleep apnea (adult) (pediatric): Secondary | ICD-10-CM | POA: Diagnosis present

## 2023-06-07 NOTE — Telephone Encounter (Signed)
 Patient returned my call to receive instructions for sleep study:  Read information to patient and he voiced his understanding:  Where?  The Sleep [facility] Center is on the first floor of [facility] at [address] [place], [place], [address]. Please arrive by 7:30 pm. If you need assistance, call [phone] to speak with a member of our Sleep Disorders Center staff.    Instructions  Please bring something comfortable to wear during your sleep study. A two-piece, cotton outfit is the best option. However, as long as you are comfortable, we can work with most materials (you must wear some type of sleep wear).  Please wash your hair the day of the study. We will be applying small sensors to your scalp, so we can record your brain waves.  You will be connected to these monitoring devices until your study is complete.  At that time, you will be disconnected and free to leave the sleep center (usually around 5:30 am - 6:00 am). Please do not schedule a hair appointment prior to your sleep study. Hair enhancements (i.e. weaves, braids, wigs, etc.), gels and tonic products will result in our inability to properly complete a sleep study. Lotions on your skin are not permitted. Please do not wear makeup because some of the test equipment will be placed on your face and fingers.  These requests are made in an effort to obtain the best possible information for your physician. Please proceed with your normal eating patterns, except do not consume caffeine after 3:00 pm on the day of your study. Please proceed with your normal medication instructions, unless otherwise advised by your physician.  If you are taking medication or need a snack in the morning, please bring it with you.  Visitors are not allowed to stay with the patients unless prior arrangements have been made with the sleep center management (exception is children).  ALL CELL PHONES MUST BE TURNED OFF BY 11:00 PM.  Rela

## 2023-06-07 NOTE — Telephone Encounter (Signed)
 Copied from CRM [id number]. Topic: Appointments - Appointment Scheduling >> Jun 07, 2023 11:43 AM Orinda Kenner C wrote: Patient/patient representative is calling to schedule an appointment. Refer to attachments for appointment information.  Patient's spouse Stanton Kidney [phone] states patient is set up for a sleep study tonight 06/07/23 at 8 pm and has not information on what to do and where. Per CAL, send message and will call patient back with information. Patient states someone is calling with the information and will answer the call.

## 2023-06-07 NOTE — Telephone Encounter (Signed)
 Patient's wife called in for information regarding the Sleep Study scheduled for  06/07/23----LVM for her to call back

## 2023-06-12 ENCOUNTER — Ambulatory Visit: Payer: Medicare Other | Attending: Cardiology | Admitting: *Deleted

## 2023-06-12 DIAGNOSIS — I480 Paroxysmal atrial fibrillation: Secondary | ICD-10-CM

## 2023-06-12 DIAGNOSIS — Z5181 Encounter for therapeutic drug level monitoring: Secondary | ICD-10-CM

## 2023-06-12 LAB — POCT INR: INR: 3 (ref 2.0–3.0)

## 2023-06-12 NOTE — Patient Instructions (Signed)
 Continue warfarin 1 tablet daily except 1 1/2 tablets Saturdays  Recheck in 6 wks

## 2023-06-14 DIAGNOSIS — G4733 Obstructive sleep apnea (adult) (pediatric): Secondary | ICD-10-CM

## 2023-06-14 NOTE — Procedures (Signed)
 Titration Interpretation  Patient Name:  Riley [name], Riley [name] Date:  06/07/2023 Referring Physician:  Glenford Bayley, Np  Indications for Polysomnography The patient is a 78 year-old Male who is 6' and weighs 226.0 lbs. His BMI equals 30.9.  A full night titration treatment study was performed.  Medication taken at 10:15 pm  Xyzal  Hydralazine  Magnesium Oxide   Polysomnogram Data A full night polysomnogram recorded the standard physiologic parameters including EEG, EOG, EMG, EKG, nasal and oral airflow.  Respiratory parameters of chest and abdominal movements were recorded with Respiratory Inductance Plethysmography belts.  Oxygen saturation was recorded by pulse oximetry.   Sleep Architecture The total recording time of the polysomnogram was 348.3 minutes.  The total sleep time was 114.0 minutes.  The patient spent 7.0% of total sleep time in Stage N1, 84.2% in Stage N2, 0.0% in Stages N3, and 8.8% in REM.  Sleep latency was 83.9 minutes.  REM latency was 79.5 minutes.  Sleep Efficiency was 32.7%.  Wake after Sleep Onset time was 148.5 minutes.  Titration Summary The patient was titrated at pressures ranging from 9* cm/H20 up to 18/14/0** cm/H20 The last pressure used in the study was 18/14/0** cm/H2o. No supplemental oxygen was needed.  Respiratory Events The polysomnogram revealed a presence of - obstructive, 17 central, and - mixed apneas resulting in an Apnea index of 8.9 events per hour.  There were 31 hypopneas (>=3% desaturation and/or arousal) resulting in an Apnea\Hypopnea Index (AHI >=3% desaturation and/or arousal) of 25.3 events per hour.  There were 18 hypopneas (>=4% desaturation) resulting in an Apnea\Hypopnea Index (AHI >=4% desaturation) of 18.4 events per hour.  There were - Respiratory Effort Related Arousals resulting in a RERA index of - events per hour. The Respiratory Disturbance Index is 25.3 events per hour.  The snore index was 103.2 events per hour.  Mean oxygen  saturation was 92.8%.  The lowest oxygen saturation during sleep was 87.0%.  Time spent <=88% oxygen saturation was 3.8 minutes (1.1%).  Limb Activity There were - limb movements recorded.  Of this total, - were classified as PLMs.  Of the PLMs, - were associated with arousals.  The Limb Movement index was - per hour while the PLM index was - per hour.  Cardiac Summary The average pulse rate was 69.5 bpm.  The minimum pulse rate was 48.0 bpm while the maximum pulse rate was 115.0 bpm.  Cardiac rhythm was normal/abnormal.  Diagnosis: OSA Treatment emergent central apneas  Recommendations: These were corrected by BiPAP 17/13 cm a large Eason 2 Nasal mask was used Sleep efficiency was poor Consider trial of auto bilevel with IPAP max 18, PS 4, EPAP min 10 for comfort   This study was personally reviewed and electronically signed by: Cyril Mourning, Md Accredited Board Certified in Sleep Medicine Date/Time:

## 2023-06-20 ENCOUNTER — Other Ambulatory Visit: Payer: Self-pay

## 2023-06-20 DIAGNOSIS — G4733 Obstructive sleep apnea (adult) (pediatric): Secondary | ICD-10-CM

## 2023-06-21 NOTE — Addendum Note (Signed)
 Addended by: Gaylon Kea on: 06/21/2023 10:54 AM   Modules accepted: Orders

## 2023-06-28 ENCOUNTER — Telehealth: Payer: Self-pay | Admitting: Internal Medicine

## 2023-06-28 ENCOUNTER — Other Ambulatory Visit: Payer: Self-pay | Admitting: Internal Medicine

## 2023-06-28 MED ORDER — DOXAZOSIN MESYLATE 4 MG PO TABS: ORAL_TABLET | ORAL | 3 refills | Status: AC

## 2023-06-28 MED ORDER — PRAVASTATIN SODIUM 20 MG PO TABS: 20.0000 mg | ORAL_TABLET | Freq: Every evening | ORAL | 3 refills | Status: AC

## 2023-06-28 NOTE — Telephone Encounter (Signed)
 Refill request completed.

## 2023-06-28 NOTE — Telephone Encounter (Signed)
*  STAT* If patient is at the pharmacy, call can be transferred to refill team.   1. Which medications need to be refilled? (please list name of each medication and dose if known)  doxazosin  (CARDURA ) 4 MG tablet  pravastatin  (PRAVACHOL ) 20 MG tablet    2. Would you like to learn more about the convenience, safety, & potential cost savings by using the [facility]?     3. Are you open to using the [facility] (Type [facility]. ).   4. Which pharmacy/location (including street and city if local pharmacy) is medication to be sent to? [facility] - [place], [place] - [id number]    5. Do they need a 30 day or 90 day supply? 90 day

## 2023-07-11 ENCOUNTER — Telehealth (INDEPENDENT_AMBULATORY_CARE_PROVIDER_SITE_OTHER): Payer: Self-pay | Admitting: Otolaryngology

## 2023-07-11 ENCOUNTER — Encounter (INDEPENDENT_AMBULATORY_CARE_PROVIDER_SITE_OTHER): Payer: Self-pay

## 2023-07-11 ENCOUNTER — Ambulatory Visit (INDEPENDENT_AMBULATORY_CARE_PROVIDER_SITE_OTHER): Admitting: Otolaryngology

## 2023-07-11 ENCOUNTER — Other Ambulatory Visit: Payer: Self-pay

## 2023-07-11 VITALS — BP 138/78 | HR 76 | Ht 72.0 in | Wt 220.0 lb

## 2023-07-11 DIAGNOSIS — R0981 Nasal congestion: Secondary | ICD-10-CM

## 2023-07-11 DIAGNOSIS — G4733 Obstructive sleep apnea (adult) (pediatric): Secondary | ICD-10-CM

## 2023-07-11 DIAGNOSIS — J342 Deviated nasal septum: Secondary | ICD-10-CM | POA: Diagnosis not present

## 2023-07-11 DIAGNOSIS — J343 Hypertrophy of nasal turbinates: Secondary | ICD-10-CM | POA: Diagnosis not present

## 2023-07-11 DIAGNOSIS — J310 Chronic rhinitis: Secondary | ICD-10-CM

## 2023-07-11 NOTE — Telephone Encounter (Signed)
 Confirmed with patient, correct location -[address] [place], [place] [address]

## 2023-07-13 DIAGNOSIS — J310 Chronic rhinitis: Secondary | ICD-10-CM | POA: Insufficient documentation

## 2023-07-13 DIAGNOSIS — J342 Deviated nasal septum: Secondary | ICD-10-CM | POA: Insufficient documentation

## 2023-07-13 DIAGNOSIS — J343 Hypertrophy of nasal turbinates: Secondary | ICD-10-CM | POA: Insufficient documentation

## 2023-07-13 NOTE — Progress Notes (Signed)
 Patient ID: Riley [name], male   DOB: 1946/01/09, 78 y.o.   MRN: [id number]  CC: Chronic nasal obstruction  HPI:  Riley [name] is a 78 y.o. male who presents today complaining of chronic nasal obstruction for 10+ years.  The severity of his nasal congestion has increased over the past year.  He was previously treated with multiple OTC allergy medications, nasal saline irrigation, Flonase , Nasacort, and Afrin.  However, he continues to be symptomatic.  He also reports occasional nasal pain and raw sensation in his nose.  He has frequent nasal drainage.  He has no previous nasal surgery.  The patient has a history of obstructive sleep apnea, and is currently using a CPAP machine at night.  He also has a history of A-fib, and is on Coumadin .  Past Medical History:  Diagnosis Date   Chronic anticoagulation    Chronic back pain    Diabetes mellitus, type 2 (HCC)    no insulin    Edema    GERD (gastroesophageal reflux disease)    Hyperlipidemia    Hypertension    normal coronary angiography and normal EF in 12/98   Nodule of left lung    Granuloma in left upper lobe   Paroxysmal atrial fibrillation (HCC)    12/2005; sinus bradycardia secondary to medications   Sleep apnea     Past Surgical History:  Procedure Laterality Date   APPENDECTOMY  1998   COLONOSCOPY N/A 11/26/2012   WUJ:WJXBJ 2 hemorrhoids. Pancolonic diverticulosis. Colonic polyps removed as described above. One hemostasis clip applied. TUBULAR ADENOMA. Surveillance Sept 2019.    COLONOSCOPY N/A 02/06/2018   two 3-5 mm cecal polyps, pancolonic diverticulosis. tubular adenoma. no surveillance due to age   INSERTION OF MESH N/A 01/27/2013   Procedure: INSERTION OF MESH;  Surgeon: Beau Bound, MD;  Location: [facility];  Service: General;  Laterality: N/A;   POLYPECTOMY  02/06/2018   Procedure: POLYPECTOMY;  Surgeon: Suzette Espy, MD;  Location: [facility];  Service: Endoscopy;;  (colon)   TONSILLECTOMY     as a child    UMBILICAL HERNIA REPAIR N/A 01/27/2013   Procedure: HERNIA REPAIR UMBILICAL ADULT;  Surgeon: Beau Bound, MD;  Location: [facility];  Service: General;  Laterality: N/A;    Family History  Problem Relation Age of Onset   Heart disease Father    Heart disease Mother    Bronchitis Mother    Allergies Other        whole family   Colon cancer Neg Hx     Social History:  reports that he has never smoked. He has never used smokeless tobacco. He reports that he does not drink alcohol and does not use drugs.  Allergies: No Known Allergies  Prior to Admission medications   Medication Sig Start Date End Date Taking? Authorizing Provider  acetaminophen  (TYLENOL ) 500 MG tablet Take 500 mg by mouth every 6 (six) hours as needed (for pain.).   Yes [provider]  azelastine  (ASTELIN ) 0.1 % nasal spray Place 1 spray into both nostrils 2 (two) times daily. Use in each nostril as directed 12/08/22  Yes Antonio Baumgarten, NP  calcium  carbonate (TUMS - DOSED IN MG ELEMENTAL CALCIUM ) 500 MG chewable tablet Chew 1 tablet by mouth as needed for indigestion or heartburn.   Yes [provider]  diltiazem  (CARDIZEM ) 30 MG tablet Take 1 tablet (30 mg total) by mouth daily as needed (Palpations). 01/01/18  Yes Flavia Hughs, MD  doxazosin  (CARDURA ) 4 MG tablet TAKE THREE TABLETS BY MOUTH EVERY EVENING 06/28/23  Yes Mallipeddi, Vishnu P, MD  glimepiride (AMARYL) 1 MG tablet Take 1 tablet by mouth daily. 12/09/18  Yes [provider]  hydrALAZINE  (APRESOLINE ) 100 MG tablet Take 1 tablet (100 mg total) by mouth 3 (three) times daily. 09/04/22  Yes Mallipeddi, Vishnu P, MD  Hydrocortisone (PREPARATION H EX) Place 1 application rectally daily as needed (irritation/discomfort).    Yes [provider]  linaclotide  (LINZESS ) 290 MCG CAPS capsule Take 1 capsule (290 mcg total) by mouth every morning. 10/04/22  Yes Delman Ferns, NP  lisinopril  (ZESTRIL ) 20 MG tablet TAKE (1) TABLET BY  MOUTH ONCE DAILY. 06/28/23  Yes Mallipeddi, Vishnu P, MD  Magnesium  Oxide 200 MG TABS Take 1 tablet (200 mg total) by mouth daily. 11/02/16  Yes Tania Familia, NP  metFORMIN  (GLUCOPHAGE ) 1000 MG tablet Take 1 tablet by mouth 2 (two) times daily. 12/09/18  Yes [provider]  Multiple Vitamin (MULTIVITAMIN) tablet Take 1 tablet by mouth daily.   Yes [provider]  pantoprazole  (PROTONIX ) 40 MG tablet TAKE ONE TABLET BY MOUTH ONCE DAILY 30 MINUTES BEFORE BREAKFAST 04/08/21  Yes Delman Ferns, NP  Phenyleph-Chlorphen-Hydrocod [facility] HD PO) Take by mouth as needed.   Yes [provider]  pravastatin  (PRAVACHOL ) 20 MG tablet Take 1 tablet (20 mg total) by mouth every evening. 06/28/23  Yes Mallipeddi, Vishnu P, MD  Triamcinolone Acetonide (NASACORT ALLERGY 24HR NA) Place 1 spray into the nose daily as needed (congestion).   Yes [provider]  warfarin (COUMADIN ) 5 MG tablet TAKE 1 TABLET BY MOUTH ONCE DAILY, EXCEPT TAKE 1 AND 1/2 TABLETS ON SATURDAYS OR AS DIRECTED. 03/28/23  Yes Mallipeddi, Vishnu P, MD  chlorthalidone  (HYGROTON ) 25 MG tablet Take 0.5 tablets (12.5 mg total) by mouth every other day. 09/04/22 12/03/22  Mallipeddi, Vishnu P, MD  furosemide  (LASIX ) 20 MG tablet Take 1 tablet (20 mg total) by mouth daily. 10/18/22 01/16/23  Mallipeddi, Vishnu P, MD  metoprolol  tartrate (LOPRESSOR ) 50 MG tablet Take 1 tablet (50 mg total) by mouth 2 (two) times daily. 10/30/22 01/28/23  Mallipeddi, Vishnu P, MD    Blood pressure 138/78, pulse 76, height 6' (1.829 m), weight 220 lb (99.8 kg), SpO2 92%. Exam: General: Communicates without difficulty, well nourished, no acute distress. Head: Normocephalic, no evidence injury, no tenderness, facial buttresses intact without stepoff. Face/sinus: No tenderness to palpation and percussion. Facial movement is normal and symmetric. Eyes: PERRL, EOMI. No scleral icterus, conjunctivae clear. Neuro: CN II exam reveals vision  grossly intact.  No nystagmus at any point of gaze. Ears: Auricles well formed without lesions.  Ear canals are intact without mass or lesion.  No erythema or edema is appreciated.  The TMs are intact without fluid. Nose: External evaluation reveals normal support and skin without lesions.  Dorsum is intact.  Anterior rhinoscopy reveals congested mucosa over anterior aspect of inferior turbinates and deviated septum.  No purulence noted. Oral:  Oral cavity and oropharynx are intact, symmetric, without erythema or edema.  Mucosa is moist without lesions. Neck: Full range of motion without pain.  There is no significant lymphadenopathy.  No masses palpable.  Thyroid bed within normal limits to palpation.  Parotid glands and submandibular glands equal bilaterally without mass.  Trachea is midline. Neuro:  CN 2-12 grossly intact.   Procedure:  Flexible Nasal Endoscopy: Description: Risks, benefits, and alternatives of flexible endoscopy were explained to the  patient.  Specific mention was made of the risk of throat numbness with difficulty swallowing, possible bleeding from the nose and mouth, and pain from the procedure.  The patient gave oral consent to proceed.  The flexible scope was inserted into the right nasal cavity.  Endoscopy of the interior nasal cavity, superior, inferior, and middle meatus was performed. The sphenoid-ethmoid recess was examined. Edematous mucosa was noted.  No polyp, mass, or lesion was appreciated. Nasal septal deviation noted. Olfactory cleft was clear.  Nasopharynx was clear.  Turbinates were hypertrophied but without mass.  The procedure was repeated on the contralateral side with similar findings.  The patient tolerated the procedure well.   Assessment: 1.  Chronic rhinitis with nasal mucosal congestion, severe nasal septal deviation, and bilateral inferior turbinate hypertrophy.  More than 95% of his nasal passageways are obstructed bilaterally. 2.  No suspicious mass or lesion  is noted today. 3.  The patient has not responded to medical treatment, including the use of Flonase , Nasacort, OTC allergy medications, and nasal saline irrigation.  He has been symptomatic for 10+ years.  Plan: 1.  The physical exam and nasal endoscopy findings are reviewed with the patient. 2.  Continue with Flonase  nasal spray 2 sprays each nostril daily.  The importance of consistent daily use is discussed. 3.  In light of his persistent symptoms, he may benefit from surgical intervention with septoplasty and bilateral turbinate reduction.  The risk, benefits, and details of the procedures are extensively discussed.  Questions are invited and answered. 4.  The patient would like to proceed with the procedures.  We will schedule the procedures in accordance with the patient's schedule. 5.  The patient will need to be off Coumadin  for 5 days prior to his surgery.  The patient should consult with his cardiologist regarding his anticoagulation regimen.  Annlouise Gerety W Clarabelle Oscarson 07/13/2023, 9:09 PM

## 2023-07-16 ENCOUNTER — Telehealth: Payer: Self-pay | Admitting: Internal Medicine

## 2023-07-16 NOTE — Telephone Encounter (Signed)
   Pre-operative Risk Assessment    Patient Name: Riley [name]  DOB: July 03, 1945 MRN: [id number]      Request for Surgical Clearance    Procedure:  Septoplasty, Bilat - Turbinate Reduction  Date of Surgery:  Clearance 08/24/23                                 Surgeon:  Dr. Reynold Caves Surgeon's Group or Practice Name:  [facility] Phone number:  [phone] Fax number:  [phone]   Type of Clearance Requested:   - Medical  - Pharmacy:  Hold Warfarin (Coumadin ) Please advise on how to hold and restart   Type of Anesthesia:  General    Additional requests/questions:    Rosea Conch   07/16/2023, 4:46 PM

## 2023-07-17 NOTE — Telephone Encounter (Signed)
 Please advise holding coumadin  prior to septoplasty bilateral turbinate reduction on 6/20  Thank you!  DW

## 2023-07-19 ENCOUNTER — Telehealth: Payer: Self-pay | Admitting: *Deleted

## 2023-07-19 ENCOUNTER — Other Ambulatory Visit: Payer: Self-pay | Admitting: Internal Medicine

## 2023-07-19 NOTE — Telephone Encounter (Signed)
 DPR ok to s/w the pt's wife, who scheduled tele preop appt 08/03/23. Med rec and consent are done.     Patient Consent for Virtual Visit        Riley [name] has provided verbal consent on 07/19/2023 for a virtual visit (video or telephone).   CONSENT FOR VIRTUAL VISIT FOR:  Riley [name]  By participating in this virtual visit I agree to the following:  I hereby voluntarily request, consent and authorize [facility] and its employed or contracted physicians, physician assistants, nurse practitioners or other licensed health care professionals (the Practitioner), to provide me with telemedicine health care services (the "Services") as deemed necessary by the treating Practitioner. I acknowledge and consent to receive the Services by the Practitioner via telemedicine. I understand that the telemedicine visit will involve communicating with the Practitioner through live audiovisual communication technology and the disclosure of certain medical information by electronic transmission. I acknowledge that I have been given the opportunity to request an in-person assessment or other available alternative prior to the telemedicine visit and am voluntarily participating in the telemedicine visit.  I understand that I have the right to withhold or withdraw my consent to the use of telemedicine in the course of my care at any time, without affecting my right to future care or treatment, and that the Practitioner or I may terminate the telemedicine visit at any time. I understand that I have the right to inspect all information obtained and/or recorded in the course of the telemedicine visit and may receive copies of available information for a reasonable fee.  I understand that some of the potential risks of receiving the Services via telemedicine include:  Delay or interruption in medical evaluation due to technological equipment failure or disruption; Information transmitted may not be sufficient (e.g.  poor resolution of images) to allow for appropriate medical decision making by the Practitioner; and/or  In rare instances, security protocols could fail, causing a breach of personal health information.  Furthermore, I acknowledge that it is my responsibility to provide information about my medical history, conditions and care that is complete and accurate to the best of my ability. I acknowledge that Practitioner's advice, recommendations, and/or decision may be based on factors not within their control, such as incomplete or inaccurate data provided by me or distortions of diagnostic images or specimens that may result from electronic transmissions. I understand that the practice of medicine is not an exact science and that Practitioner makes no warranties or guarantees regarding treatment outcomes. I acknowledge that a copy of this consent can be made available to me via my patient portal [facility] MyChart), or I can request a printed copy by calling the office of [facility].    I understand that my insurance will be billed for this visit.   I have read or had this consent read to me. I understand the contents of this consent, which adequately explains the benefits and risks of the Services being provided via telemedicine.  I have been provided ample opportunity to ask questions regarding this consent and the Services and have had my questions answered to my satisfaction. I give my informed consent for the services to be provided through the use of telemedicine in my medical care

## 2023-07-19 NOTE — Telephone Encounter (Signed)
 DPR ok to s/w the pt's wife, who scheduled tele preop appt 08/03/23. Med rec and consent are done.

## 2023-07-19 NOTE — Telephone Encounter (Signed)
   Name: Riley [name]  DOB: February 24, 1946  MRN: [id number]  Primary Cardiologist: Vishnu P Mallipeddi, MD   Preoperative team, please contact this patient and set up a phone call appointment for further preoperative risk assessment. Please obtain consent and complete medication review. Thank you for your help.  I confirm that guidance regarding antiplatelet and oral anticoagulation therapy has been completed and, if necessary, noted below.  Per office protocol, patient can hold warfarin for 5 days prior to procedure.     Patient will not need bridging with Lovenox  (enoxaparin ) around procedure.  I also confirmed the patient resides in the state of [place] . As per [facility] Medical Board telemedicine laws, the patient must reside in the state in which the provider is licensed.   Ava Boatman, NP 07/19/2023, 4:51 PM [facility]

## 2023-07-19 NOTE — Telephone Encounter (Signed)
 Patient with diagnosis of A Fib on warfarin for anticoagulation.    Procedure: Septoplasty, Bilat - Turbinate Reduction  Date of procedure: 08/24/23   CHA2DS2-VASc Score = 5  This indicates a 7.2% annual risk of stroke. The patient's score is based upon: CHF History: 0 HTN History: 1 Diabetes History: 1 Stroke History: 0 Vascular Disease History: 1 Age Score: 2 Gender Score: 0    CrCl 68 ml/min Platelet count 204K   Per office protocol, patient can hold warfarin for 5 days prior to procedure.    Patient will not need bridging with Lovenox  (enoxaparin ) around procedure.  **This guidance is not considered finalized until pre-operative APP has relayed final recommendations.**

## 2023-07-24 ENCOUNTER — Telehealth: Payer: Self-pay | Admitting: Primary Care

## 2023-07-24 ENCOUNTER — Ambulatory Visit: Attending: Cardiology | Admitting: *Deleted

## 2023-07-24 DIAGNOSIS — I480 Paroxysmal atrial fibrillation: Secondary | ICD-10-CM | POA: Diagnosis not present

## 2023-07-24 DIAGNOSIS — Z5181 Encounter for therapeutic drug level monitoring: Secondary | ICD-10-CM | POA: Diagnosis not present

## 2023-07-24 LAB — POCT INR: INR: 2.6 (ref 2.0–3.0)

## 2023-07-24 NOTE — Telephone Encounter (Signed)
 Rc'd CMN for CPAP supplies. Has not been seen in office since 2020. May have seen Ms. Sueanne Emerald on video on 04/13/23 but there was no AVS so not sure of actual visit or not. Will fwd to Ms. Sueanne Emerald for [employer].

## 2023-07-24 NOTE — Patient Instructions (Signed)
 Continue warfarin 1 tablet daily except 1 1/2 tablets Saturdays  Scheduled for septoplasty 08/24/23 OK to hold warfarin 5 days before procedure.  Will not need Lovenox  bridge.  Take last dose of warfarin 6/14 and resume night of procedure if ok with surgeon. Recheck 7 - 10 days after procedure.

## 2023-07-24 NOTE — Telephone Encounter (Signed)
 Rc'd signed copy of CMN from Ms. Sueanne Emerald. Will fax to [phone] and wait for confirmation.

## 2023-07-25 NOTE — Telephone Encounter (Signed)
 CMN faxed successfully and signed.

## 2023-08-03 ENCOUNTER — Ambulatory Visit: Attending: Cardiology | Admitting: Nurse Practitioner

## 2023-08-03 DIAGNOSIS — Z01810 Encounter for preprocedural cardiovascular examination: Secondary | ICD-10-CM | POA: Diagnosis not present

## 2023-08-03 NOTE — Progress Notes (Signed)
 Virtual Visit via Telephone Note   Because of Riley [name] co-morbid illnesses, he is at least at moderate risk for complications without adequate follow up.  This format is felt to be most appropriate for this patient at this time.  Due to technical limitations with video connection Web designer), today's appointment will be conducted as an audio only telehealth visit, and Riley [name] verbally agreed to proceed in this manner.   All issues noted in this document were discussed and addressed.  No physical exam could be performed with this format.  Evaluation Performed:  Preoperative cardiovascular risk assessment _____________   Date:  08/03/2023   Patient ID:  Riley [name], DOB 08-26-1945, MRN [id number] Patient Location:  Home Provider location:   Office  Primary Care Provider:  Kathyleen Parkins, MD Primary Cardiologist:  Vishnu P Mallipeddi, MD  Chief Complaint / Patient Profile   78 y.o. y/o male with a h/o paroxysmal atrial fibrillation, hypertension, hyperlipidemia, type 2 diabetes, and OSA on CPAP who is pending Septoplasty, Bilat - Turbinate Reduction on 08/24/2023 with Dr. Reynold Caves of [facility] and presents today for telephonic preoperative cardiovascular risk assessment.  History of Present Illness    Riley [name] is a 78 y.o. male who presents via audio/video conferencing for a telehealth visit today.  Pt was last seen in cardiology clinic on 04/04/2023 by Dr. Mallipeddi. At that time Riley [name] was doing well.  The patient is now pending procedure as outlined above. Since his last visit, he has done well from a cardiac standpoint.   He denies chest pain, palpitations, dyspnea, pnd, orthopnea, n, v, dizziness, syncope, edema, weight gain, or early satiety. All other systems reviewed and are otherwise negative except as noted above.   Past Medical History    Past Medical History:  Diagnosis Date   Chronic anticoagulation    Chronic back pain    Diabetes  mellitus, type 2 (HCC)    no insulin    Edema    GERD (gastroesophageal reflux disease)    Hyperlipidemia    Hypertension    normal coronary angiography and normal EF in 12/98   Nodule of left lung    Granuloma in left upper lobe   Paroxysmal atrial fibrillation (HCC)    12/2005; sinus bradycardia secondary to medications   Sleep apnea    Past Surgical History:  Procedure Laterality Date   APPENDECTOMY  1998   COLONOSCOPY N/A 11/26/2012   WUJ:WJXBJ 2 hemorrhoids. Pancolonic diverticulosis. Colonic polyps removed as described above. One hemostasis clip applied. TUBULAR ADENOMA. Surveillance Sept 2019.    COLONOSCOPY N/A 02/06/2018   two 3-5 mm cecal polyps, pancolonic diverticulosis. tubular adenoma. no surveillance due to age   INSERTION OF MESH N/A 01/27/2013   Procedure: INSERTION OF MESH;  Surgeon: Beau Bound, MD;  Location: [facility];  Service: General;  Laterality: N/A;   POLYPECTOMY  02/06/2018   Procedure: POLYPECTOMY;  Surgeon: Suzette Espy, MD;  Location: [facility] SU[facility];  Service: Endoscopy;;  (colon)   TONSILLECTOMY     as a child   UMBILICAL HERNIA REPAIR N/A 01/27/2013   Procedure: HERNIA REPAIR UMBILICAL ADULT;  Surgeon: Beau Bound, MD;  Location: [facility];  Service: General;  Laterality: N/A;    Allergies  No Known Allergies  Home Medications    Prior to Admission medications   Medication Sig Start Date End Date Taking? Authorizing Provider  acetaminophen  (TYLENOL ) 500 MG tablet Take 500 mg by  mouth every 6 (six) hours as needed (for pain.).    [provider]  azelastine  (ASTELIN ) 0.1 % nasal spray Place 1 spray into both nostrils 2 (two) times daily. Use in each nostril as directed 12/08/22   Antonio Baumgarten, NP  calcium  carbonate (TUMS - DOSED IN MG ELEMENTAL CALCIUM ) 500 MG chewable tablet Chew 1 tablet by mouth as needed for indigestion or heartburn.    [provider]  chlorthalidone  (HYGROTON ) 25 MG tablet Take 0.5 tablets (12.5 mg  total) by mouth every other day. 09/04/22 12/03/22  Mallipeddi, Vishnu P, MD  diltiazem  (CARDIZEM ) 30 MG tablet Take 1 tablet (30 mg total) by mouth daily as needed (Palpations). 01/01/18   Flavia Hughs, MD  doxazosin  (CARDURA ) 4 MG tablet TAKE THREE TABLETS BY MOUTH EVERY EVENING 06/28/23   Mallipeddi, Vishnu P, MD  furosemide  (LASIX ) 20 MG tablet Take 1 tablet (20 mg total) by mouth daily. 10/18/22 01/16/23  Mallipeddi, Vishnu P, MD  glimepiride (AMARYL) 1 MG tablet Take 1 tablet by mouth daily. 12/09/18   [provider]  hydrALAZINE  (APRESOLINE ) 100 MG tablet Take 1 tablet (100 mg total) by mouth 3 (three) times daily. 09/04/22   Mallipeddi, Vishnu P, MD  Hydrocortisone (PREPARATION H EX) Place 1 application rectally daily as needed (irritation/discomfort).     [provider]  linaclotide  (LINZESS ) 290 MCG CAPS capsule Take 1 capsule (290 mcg total) by mouth every morning. 10/04/22   Delman Ferns, NP  lisinopril  (ZESTRIL ) 20 MG tablet TAKE (1) TABLET BY MOUTH ONCE DAILY. 06/28/23   Mallipeddi, Vishnu P, MD  Magnesium  Oxide 200 MG TABS Take 1 tablet (200 mg total) by mouth daily. 11/02/16   Tania Familia, NP  metFORMIN  (GLUCOPHAGE ) 1000 MG tablet Take 1 tablet by mouth 2 (two) times daily. 12/09/18   [provider]  metoprolol  tartrate (LOPRESSOR ) 50 MG tablet Take 1 tablet (50 mg total) by mouth 2 (two) times daily. 07/19/23 10/17/23  Mallipeddi, Vishnu P, MD  Multiple Vitamin (MULTIVITAMIN) tablet Take 1 tablet by mouth daily.    [provider]  pantoprazole  (PROTONIX ) 40 MG tablet TAKE ONE TABLET BY MOUTH ONCE DAILY 30 MINUTES BEFORE BREAKFAST 04/08/21   Delman Ferns, NP  Phenyleph-Chlorphen-Hydrocod [facility] PO) Take by mouth as needed.    [provider]  pravastatin  (PRAVACHOL ) 20 MG tablet Take 1 tablet (20 mg total) by mouth every evening. 06/28/23   Mallipeddi, Vishnu P, MD  Triamcinolone Acetonide (NASACORT ALLERGY 24HR NA) Place 1  spray into the nose daily as needed (congestion).    [provider]  warfarin (COUMADIN ) 5 MG tablet TAKE 1 TABLET BY MOUTH ONCE DAILY, EXCEPT TAKE 1 AND 1/2 TABLETS ON SATURDAYS OR AS DIRECTED. 03/28/23   Mallipeddi, Kennyth Pean, MD    Physical Exam    Vital Signs:  KAUSHAL VANNICE does not have vital signs available for review today.  Given telephonic nature of communication, physical exam is limited. AAOx3. NAD. Normal affect.  Speech and respirations are unlabored.  Accessory Clinical Findings    None  Assessment & Plan    1.  Preoperative Cardiovascular Risk Assessment:  According to the Revised Cardiac Risk Index (RCRI), his Perioperative Risk of Major Cardiac Event is (%): 0.4. His Functional Capacity in METs is: 6.36 according to the Duke Activity Status Index (DASI).Therefore, based on ACC/AHA guidelines, patient would be at acceptable risk for the planned procedure without further cardiovascular testing.  The patient was advised  that if he develops new symptoms prior to surgery to contact our office to arrange for a follow-up visit, and he verbalized understanding.  Per office protocol, patient can hold warfarin for 5 days prior to procedure.  Please resume warfarin as soon as possible postprocedure, at the discretion of the surgeon.   Patient will not need bridging with Lovenox  (enoxaparin ) around procedure.    A copy of this note will be routed to requesting surgeon.  Time:   Today, I have spent 7 minutes with the patient with telehealth technology discussing medical history, symptoms, and management plan.     Jude Norton, NP  08/03/2023, 9:48 AM

## 2023-08-14 ENCOUNTER — Encounter: Payer: Self-pay | Admitting: Internal Medicine

## 2023-08-15 ENCOUNTER — Ambulatory Visit: Admitting: Primary Care

## 2023-08-16 NOTE — Progress Notes (Signed)
 Surgical Instructions   Your procedure is scheduled on Friday August 24, 2023. Report to [facility] Main Entrance A at 1:00 A.M., then check in with the Admitting office. Any questions or running late day of surgery: call [phone]  Questions prior to your surgery date: call [phone], Monday-Friday, 8am-4pm. If you experience any cold or flu symptoms such as cough, fever, chills, shortness of breath, etc. between now and your scheduled surgery, please notify us  at the above number.     Remember:  Do not eat after midnight the night before your surgery   You may drink clear liquids until 10:00 the morning of your surgery.   Clear liquids allowed are: Water , Non-Citrus Juices (without pulp), Carbonated Beverages, Clear Tea (no milk, honey, etc.), Black Coffee Only (NO MILK, CREAM OR POWDERED CREAMER of any kind), and Gatorade.    Take these medicines the morning of surgery with A SIP OF WATER   hydrALAZINE  (APRESOLINE )  metoprolol  tartrate (LOPRESSOR )    May take these medicines IF NEEDED: acetaminophen  (TYLENOL )  diltiazem  (CARDIZEM )  PER YOUR CARDIOLOGIST'S INSTRUCTIONS DO NOT TAKE YOUR warfarin (COUMADIN ) 5 DAYS PRIOR TO SURGERY WITH THE LAST DOSE BEING 08/18/2023.    One week prior to surgery, STOP taking any Aspirin (unless otherwise instructed by your surgeon) Aleve, Naproxen, Ibuprofen, Motrin, Advil, Goody's, BC's, all herbal medications, fish oil, and non-prescription vitamins.   WHAT DO I DO ABOUT MY DIABETES MEDICATION?   Do not take oral diabetes medicines (pills) the morning of surgery.        DO NOT TAKE YOUR glimepiride (AMARYL) THE MORNING OF SURGERY.         DO NOT TAKE YOUR metFORMIN  (GLUCOPHAGE ) THE MORNING OF SURGERY.     The day of surgery, do not take other diabetes injectables, including Byetta (exenatide), Bydureon (exenatide ER), Victoza (liraglutide), or Trulicity (dulaglutide).  If your CBG is greater than 220 mg/dL, you may take  of your  sliding scale (correction) dose of insulin .   HOW TO MANAGE YOUR DIABETES BEFORE AND AFTER SURGERY  Why is it important to control my blood sugar before and after surgery? Improving blood sugar levels before and after surgery helps healing and can limit problems. A way of improving blood sugar control is eating a healthy diet by:  Eating less sugar and carbohydrates  Increasing activity/exercise  Talking with your doctor about reaching your blood sugar goals High blood sugars (greater than 180 mg/dL) can raise your risk of infections and slow your recovery, so you will need to focus on controlling your diabetes during the weeks before surgery. Make sure that the doctor who takes care of your diabetes knows about your planned surgery including the date and location.  How do I manage my blood sugar before surgery? Check your blood sugar at least 4 times a day, starting 2 days before surgery, to make sure that the level is not too high or low.  Check your blood sugar the morning of your surgery when you wake up and every 2 hours until you get to the Short Stay unit.  If your blood sugar is less than 70 mg/dL, you will need to treat for low blood sugar: Do not take insulin . Treat a low blood sugar (less than 70 mg/dL) with  cup of clear juice (cranberry or apple), 4 glucose tablets, OR glucose gel. Recheck blood sugar in 15 minutes after treatment (to make sure it is greater than 70 mg/dL). If your blood sugar is not greater than  70 mg/dL on recheck, call [phone] for further instructions. Report your blood sugar to the short stay nurse when you get to Short Stay.  If you are admitted to the hospital after surgery: Your blood sugar will be checked by the staff and you will probably be given insulin  after surgery (instead of oral diabetes medicines) to make sure you have good blood sugar levels. The goal for blood sugar control after surgery is 80-180 mg/dL.                      Do  NOT Smoke (Tobacco/Vaping) for 24 hours prior to your procedure.  If you use a CPAP at night, you may bring your mask/headgear for your overnight stay.   You will be asked to remove any contacts, glasses, piercing's, hearing aid's, dentures/partials prior to surgery. Please bring cases for these items if needed.    Patients discharged the day of surgery will not be allowed to drive home, and someone needs to stay with them for 24 hours.  SURGICAL WAITING ROOM VISITATION Patients may have no more than 2 support people in the waiting area - these visitors may rotate.   Pre-op nurse will coordinate an appropriate time for 1 ADULT support person, who may not rotate, to accompany patient in pre-op.  Children under the age of 33 must have an adult with them who is not the patient and must remain in the main waiting area with an adult.  If the patient needs to stay at the hospital during part of their recovery, the visitor guidelines for inpatient rooms apply.  Please refer to the [facility] website for the visitor guidelines for any additional information.   If you received a COVID test during your pre-op visit  it is requested that you wear a mask when out in public, stay away from anyone that may not be feeling well and notify your surgeon if you develop symptoms. If you have been in contact with anyone that has tested positive in the last 10 days please notify you surgeon.      Pre-operative CHG Bathing Instructions   You can play a key role in reducing the risk of infection after surgery. Your skin needs to be as free of germs as possible. You can reduce the number of germs on your skin by washing with CHG (chlorhexidine gluconate) soap before surgery. CHG is an antiseptic soap that kills germs and continues to kill germs even after washing.   DO NOT use if you have an allergy to chlorhexidine/CHG or antibacterial soaps. If your skin becomes reddened or irritated, stop using the CHG and  notify one of our RNs at [phone].              TAKE A SHOWER THE NIGHT BEFORE SURGERY AND THE DAY OF SURGERY    Please keep in mind the following:  You may shave your face before/day of surgery.  Place clean sheets on your bed the night before surgery Use a clean washcloth (not used since being washed) for each shower. DO NOT sleep with pet's night before surgery.  CHG Shower Instructions:  Wash your face and private area with normal soap. If you choose to wash your hair, wash first with your normal shampoo.  After you use shampoo/soap, rinse your hair and body thoroughly to remove shampoo/soap residue.  Turn the water  OFF and apply half the bottle of CHG soap to a CLEAN washcloth.  Apply CHG soap ONLY  FROM YOUR NECK DOWN TO YOUR TOES (washing for 3-5 minutes)  DO NOT use CHG soap on face, private areas, open wounds, or sores.  Pay special attention to the area where your surgery is being performed.  If you are having back surgery, having someone wash your back for you may be helpful. Wait 2 minutes after CHG soap is applied, then you may rinse off the CHG soap.  Pat dry with a clean towel  Put on clean pajamas    Additional instructions for the day of surgery: DO NOT APPLY any lotions, deodorants or cologne.    Do not wear jewelry Do not bring valuables to the hospital. [facility] is not responsible for valuables/personal belongings. Put on clean/comfortable clothes.  Please brush your teeth.  Ask your nurse before applying any prescription medications to the skin.

## 2023-08-17 ENCOUNTER — Encounter (HOSPITAL_COMMUNITY)
Admission: RE | Admit: 2023-08-17 | Discharge: 2023-08-17 | Disposition: A | Discharge status: Home / Self Care | Source: Ambulatory Visit | Attending: Otolaryngology | Admitting: Otolaryngology

## 2023-08-17 ENCOUNTER — Other Ambulatory Visit: Payer: Self-pay

## 2023-08-17 ENCOUNTER — Encounter (HOSPITAL_COMMUNITY): Payer: Self-pay

## 2023-08-17 VITALS — BP 131/86 | HR 64 | Temp 98.3°F | Resp 18 | Ht 72.0 in | Wt 235.2 lb

## 2023-08-17 DIAGNOSIS — I480 Paroxysmal atrial fibrillation: Secondary | ICD-10-CM | POA: Insufficient documentation

## 2023-08-17 DIAGNOSIS — I10 Essential (primary) hypertension: Secondary | ICD-10-CM | POA: Insufficient documentation

## 2023-08-17 DIAGNOSIS — I252 Old myocardial infarction: Secondary | ICD-10-CM | POA: Diagnosis not present

## 2023-08-17 DIAGNOSIS — E785 Hyperlipidemia, unspecified: Secondary | ICD-10-CM | POA: Diagnosis not present

## 2023-08-17 DIAGNOSIS — G4733 Obstructive sleep apnea (adult) (pediatric): Secondary | ICD-10-CM | POA: Insufficient documentation

## 2023-08-17 DIAGNOSIS — Z01818 Encounter for other preprocedural examination: Secondary | ICD-10-CM

## 2023-08-17 DIAGNOSIS — Z01812 Encounter for preprocedural laboratory examination: Secondary | ICD-10-CM | POA: Diagnosis present

## 2023-08-17 LAB — BASIC METABOLIC PANEL WITH GFR
Anion gap: 10 (ref 5–15)
BUN: 18 mg/dL (ref 8–23)
CO2: 29 mmol/L (ref 22–32)
Calcium: 9.5 mg/dL (ref 8.9–10.3)
Chloride: 104 mmol/L (ref 98–111)
Creatinine, Ser: 1.17 mg/dL (ref 0.61–1.24)
GFR, Estimated: >60 mL/min (ref >60)
Glucose, Bld: 111 mg/dL — ABNORMAL HIGH (ref 70–99)
Potassium: 3.9 mmol/L (ref 3.5–5.1)
Sodium: 143 mmol/L (ref 135–145)

## 2023-08-17 LAB — CBC
HCT: 45.5 % (ref 39.0–52.0)
Hemoglobin: 14.9 g/dL (ref 13.0–17.0)
MCH: 30 pg (ref 26.0–34.0)
MCHC: 32.7 g/dL (ref 30.0–36.0)
MCV: 91.5 fL (ref 80.0–100.0)
Platelets: 200 10*3/uL (ref 150–400)
RBC: 4.97 MIL/uL (ref 4.22–5.81)
RDW: 14.6 % (ref 11.5–15.5)
WBC: 6.7 10*3/uL (ref 4.0–10.5)
nRBC: 0 % (ref 0.0–0.2)

## 2023-08-17 LAB — GLUCOSE, CAPILLARY: Glucose-Capillary: 106 mg/dL — ABNORMAL HIGH (ref 70–99)

## 2023-08-17 NOTE — Progress Notes (Signed)
 PCP - Kathyleen Parkins, MD  Cardiologist - Vishnu Babs Less, MD   PPM/ICD - denies Device Orders - n/a Rep Notified - n/a  Chest x-ray -  EKG - 04-04-23 Stress Test - 10-20-16 ECHO - 10-13-22 Cardiac Cath -   Sleep Study - 06-07-23 CPAP - nightly  Fasting Blood Sugar - Blood sugar at PAT appointment 106 Checks Blood Sugar Per patient he does not check blood sugar. MD monitors A1c   Last dose of GLP1 agonist-   GLP1 instructions:   Blood Thinner Instructions:warfarin (COUMADIN ) last dose 08-18-23 Aspirin Instructions:n/a  ERAS Protcol - clear liquids till 10:00 am  COVID TEST- n/a   Anesthesia review: yes  Patient denies shortness of breath, fever, cough and chest pain at PAT appointment   All instructions explained to the patient, with a verbal understanding of the material. Patient agrees to go over the instructions while at home for a better understanding. Patient also instructed to self quarantine after being tested for COVID-19. The opportunity to ask questions was provided.

## 2023-08-20 NOTE — Anesthesia Preprocedure Evaluation (Addendum)
 Anesthesia Evaluation  Patient identified by MRN, date of birth, ID band Patient awake    Reviewed: Allergy & Precautions, H&P , NPO status , Patient's Chart, lab work & pertinent test results, reviewed documented beta blocker date and time   Airway Mallampati: II  TM Distance: >3 FB     Dental  (+) Teeth Intact   Pulmonary sleep apnea and Continuous Positive Airway Pressure Ventilation    breath sounds clear to auscultation       Cardiovascular hypertension, Pt. on medications (-) angina + CAD  + dysrhythmias Atrial Fibrillation  Rhythm:Regular Rate:Bradycardia     Neuro/Psych    GI/Hepatic ,GERD  Controlled and Medicated,,  Endo/Other  diabetes, Well Controlled, Type 2, Oral Hypoglycemic Agents    Renal/GU      Musculoskeletal   Abdominal  (+) + obese  Peds  Hematology   Anesthesia Other Findings   Reproductive/Obstetrics                             Anesthesia Physical Anesthesia Plan  ASA: III  Anesthesia Plan: General   Post-op Pain Management:    Induction: Intravenous  PONV Risk Score and Plan: 2 and Ondansetron , Midazolam  and Treatment may vary due to age or medical condition  Airway Management Planned: Oral ETT  Additional Equipment:   Intra-op Plan:   Post-operative Plan: Extubation in OR  Informed Consent: I have reviewed the patients History and Physical, chart, labs and discussed the procedure including the risks, benefits and alternatives for the proposed anesthesia with the patient or authorized representative who has indicated his/her understanding and acceptance.       Plan Discussed with:   Anesthesia Plan Comments: (PAT note by Lynwood Hope, PA-C: 78 year old male follows with cardiology for history of paroxysmal A-fib on Coumadin , HTN, HLD, OSA on CPAP.  Nuclear stress 11/2016 showed findings consistent with prior moderate-sized MI with mild peri-infarct  ischemia, overall low risk.  Echo 10/2022 showed EF 50 to 55%, RV systolic function mildly reduced, RVSP 38.8 mmHg, CVP 15 mmHg, no significant valvular abnormalities (treated for volume overload at that time with Lasix ).  Seen by Damien Braver, NP on 08/03/2023 for preop evaluation.  Per note, According to the Revised Cardiac Risk Index (RCRI), his Perioperative Risk of Major Cardiac Event is (%): 0.4. His Functional Capacity in METs is: 6.36 according to the Duke Activity Status Index (DASI).Therefore, based on ACC/AHA guidelines, patient would be at acceptable risk for the planned procedure without further cardiovascular testing.The patient was advised that if he develops new symptoms prior to surgery to contact our office to arrange for a follow-up visit, and he verbalized understanding.Per office protocol, patient can hold warfarin for 5 days prior to procedure.  Please resume warfarin as soon as possible postprocedure, at the discretion of the surgeon. Patient will not need bridging with Lovenox  (enoxaparin ) around procedure.  Patient reports last dose Coumadin  08/18/2023.  Other pertinent history includes renal insufficiency, non-insulin -dependent DM2, GERD on PPI.  Preop labs reviewed, WNL.  EKG 04/04/23: Atrial fibrillation with premature ventricular or aberrantly conducted complexes. Rate 73. Left axis deviation. Right bundle branch block. Inferior infarct , age undetermined  TTE 10/13/2022: 1. Left ventricular ejection fraction, by estimation, is 50 to 55%. The  left ventricle has low normal function. The left ventricle has no regional  wall motion abnormalities. Left ventricular diastolic function could not  be evaluated. Elevated left  ventricular end-diastolic pressure. There is  the interventricular septum  is flattened in systole and diastole, consistent with right ventricular  pressure and volume overload.  2. Right ventricular systolic function is mildly reduced. The right  ventricular  size is mildly enlarged. There is mildly elevated pulmonary  artery systolic pressure. The estimated right ventricular systolic  pressure is 38.8 mmHg.  3. Left atrial size was severely dilated.  4. The mitral valve is normal in structure. Trivial mitral valve  regurgitation. No evidence of mitral stenosis.  5. The aortic valve is tricuspid. There is moderate calcification of the  aortic valve. Aortic valve regurgitation is not visualized. Aortic valve  sclerosis/calcification is present, without any evidence of aortic  stenosis.  6. The inferior vena cava is dilated in size with <50% respiratory  variability, suggesting right atrial pressure of 15 mmHg.  She did not react  Nuclear stress 11/09/2016:  There was no ST segment deviation noted during stress.  The left ventricular ejection fraction is normal (55-65%).  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  This is a low risk study. Overall mild area of mycardoium currently at jeopardy.  )        Anesthesia Quick Evaluation

## 2023-08-20 NOTE — Progress Notes (Signed)
 Anesthesia Chart Review:  78 year old male follows with cardiology for history of paroxysmal A-fib on Coumadin , HTN, HLD, OSA on CPAP.  Nuclear stress 11/2016 showed findings consistent with prior moderate-sized MI with mild peri-infarct ischemia, overall low risk.  Echo 10/2022 showed EF 50 to 55%, RV systolic function mildly reduced, RVSP 38.8 mmHg, CVP 15 mmHg, no significant valvular abnormalities (treated for volume overload at that time with Lasix ).  Seen by Marlana Silvan, NP on 08/03/2023 for preop evaluation.  Per note, According to the Revised Cardiac Risk Index (RCRI), his Perioperative Risk of Major Cardiac Event is (%): 0.4. His Functional Capacity in METs is: 6.36 according to the [place] Activity Status Index (DASI).Therefore, based on ACC/AHA guidelines, patient would be at acceptable risk for the planned procedure without further cardiovascular testing.The patient was advised that if he develops new symptoms prior to surgery to contact our office to arrange for a follow-up visit, and he verbalized understanding.Per office protocol, patient can hold warfarin for 5 days prior to procedure.  Please resume warfarin as soon as possible postprocedure, at the discretion of the surgeon. Patient will not need bridging with Lovenox  (enoxaparin ) around procedure.  Patient reports last dose Coumadin  08/18/2023.  Other pertinent history includes renal insufficiency, non-insulin -dependent DM2, GERD on PPI.  Preop labs reviewed, WNL.  EKG 04/04/23: Atrial fibrillation with premature ventricular or aberrantly conducted complexes. Rate 73. Left axis deviation. Right bundle branch block. Inferior infarct , age undetermined  TTE 10/13/2022: 1. Left ventricular ejection fraction, by estimation, is 50 to 55%. The  left ventricle has low normal function. The left ventricle has no regional  wall motion abnormalities. Left ventricular diastolic function could not  be evaluated. Elevated left  ventricular  end-diastolic pressure. There is the interventricular septum  is flattened in systole and diastole, consistent with right ventricular  pressure and volume overload.   2. Right ventricular systolic function is mildly reduced. The right  ventricular size is mildly enlarged. There is mildly elevated pulmonary  artery systolic pressure. The estimated right ventricular systolic  pressure is 38.8 mmHg.   3. Left atrial size was severely dilated.   4. The mitral valve is normal in structure. Trivial mitral valve  regurgitation. No evidence of mitral stenosis.   5. The aortic valve is tricuspid. There is moderate calcification of the  aortic valve. Aortic valve regurgitation is not visualized. Aortic valve  sclerosis/calcification is present, without any evidence of aortic  stenosis.   6. The inferior vena cava is dilated in size with <50% respiratory  variability, suggesting right atrial pressure of 15 mmHg.  She did not react  Nuclear stress 11/09/2016: There was no ST segment deviation noted during stress. The left ventricular ejection fraction is normal (55-65%). 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 This is a low risk study. Overall mild area of mycardoium currently at jeopardy.   Edilia Gordon [facility]/Anesthesiology Phone [phone] 08/20/2023 1:23 PM

## 2023-08-24 ENCOUNTER — Ambulatory Visit (HOSPITAL_COMMUNITY)
Admission: RE | Admit: 2023-08-24 | Discharge: 2023-08-24 | Disposition: A | Discharge status: Home / Self Care | Attending: Otolaryngology | Admitting: Otolaryngology

## 2023-08-24 ENCOUNTER — Encounter (HOSPITAL_COMMUNITY): Payer: Self-pay | Admitting: Otolaryngology

## 2023-08-24 ENCOUNTER — Ambulatory Visit (HOSPITAL_COMMUNITY): Admitting: Physician Assistant

## 2023-08-24 ENCOUNTER — Other Ambulatory Visit: Payer: Self-pay

## 2023-08-24 ENCOUNTER — Encounter (HOSPITAL_COMMUNITY)
Admission: RE | Disposition: A | Discharge status: Home / Self Care | Payer: Self-pay | Source: Home / Self Care | Attending: Otolaryngology

## 2023-08-24 DIAGNOSIS — G4733 Obstructive sleep apnea (adult) (pediatric): Secondary | ICD-10-CM | POA: Insufficient documentation

## 2023-08-24 DIAGNOSIS — E119 Type 2 diabetes mellitus without complications: Secondary | ICD-10-CM | POA: Insufficient documentation

## 2023-08-24 DIAGNOSIS — I4891 Unspecified atrial fibrillation: Secondary | ICD-10-CM | POA: Diagnosis not present

## 2023-08-24 DIAGNOSIS — Z7984 Long term (current) use of oral hypoglycemic drugs: Secondary | ICD-10-CM | POA: Insufficient documentation

## 2023-08-24 DIAGNOSIS — J3489 Other specified disorders of nose and nasal sinuses: Secondary | ICD-10-CM | POA: Diagnosis not present

## 2023-08-24 DIAGNOSIS — I480 Paroxysmal atrial fibrillation: Secondary | ICD-10-CM | POA: Insufficient documentation

## 2023-08-24 DIAGNOSIS — J343 Hypertrophy of nasal turbinates: Secondary | ICD-10-CM | POA: Insufficient documentation

## 2023-08-24 DIAGNOSIS — I10 Essential (primary) hypertension: Secondary | ICD-10-CM | POA: Insufficient documentation

## 2023-08-24 DIAGNOSIS — K219 Gastro-esophageal reflux disease without esophagitis: Secondary | ICD-10-CM | POA: Diagnosis not present

## 2023-08-24 DIAGNOSIS — J342 Deviated nasal septum: Secondary | ICD-10-CM | POA: Insufficient documentation

## 2023-08-24 DIAGNOSIS — Z7901 Long term (current) use of anticoagulants: Secondary | ICD-10-CM | POA: Insufficient documentation

## 2023-08-24 DIAGNOSIS — I2510 Atherosclerotic heart disease of native coronary artery without angina pectoris: Secondary | ICD-10-CM | POA: Diagnosis not present

## 2023-08-24 HISTORY — PX: NASAL SEPTOPLASTY W/ TURBINOPLASTY: SHX2070

## 2023-08-24 LAB — PROTIME-INR
INR: 1.1 (ref 0.8–1.2)
Prothrombin Time: 14.4 s (ref 11.4–15.2)

## 2023-08-24 LAB — GLUCOSE, CAPILLARY
Glucose-Capillary: 122 mg/dL — ABNORMAL HIGH (ref 70–99)
Glucose-Capillary: 128 mg/dL — ABNORMAL HIGH (ref 70–99)

## 2023-08-24 SURGERY — SEPTOPLASTY, NOSE, WITH NASAL TURBINATE REDUCTION
Anesthesia: General | Site: Nose | Laterality: Bilateral

## 2023-08-24 MED ORDER — ROCURONIUM BROMIDE 10 MG/ML (PF) SYRINGE
PREFILLED_SYRINGE | INTRAVENOUS | Status: DC | PRN
  Administered 2023-08-24: 70 mg via INTRAVENOUS

## 2023-08-24 MED ORDER — OXYCODONE HCL 5 MG PO TABS: 5.0000 mg | ORAL_TABLET | Freq: Once | ORAL | Status: DC | PRN

## 2023-08-24 MED ORDER — INSULIN ASPART 100 UNIT/ML IJ SOLN: 0.0000 [IU] | INTRAMUSCULAR | Status: DC | PRN

## 2023-08-24 MED ORDER — PROPOFOL 500 MG/50ML IV EMUL
INTRAVENOUS | Status: DC | PRN
  Administered 2023-08-24: 100 ug/kg/min via INTRAVENOUS

## 2023-08-24 MED ORDER — 0.9 % SODIUM CHLORIDE (POUR BTL) OPTIME
TOPICAL | Status: DC | PRN
  Administered 2023-08-24: 1000 mL

## 2023-08-24 MED ORDER — PHENYLEPHRINE HCL-NACL 20-0.9 MG/250ML-% IV SOLN
INTRAVENOUS | Status: DC | PRN
  Administered 2023-08-24: 30 ug/min via INTRAVENOUS

## 2023-08-24 MED ORDER — PROPOFOL 10 MG/ML IV BOLUS
INTRAVENOUS | Status: AC
Start: 2023-08-24 — End: 2023-08-24
  Filled 2023-08-24: qty 20

## 2023-08-24 MED ORDER — LIDOCAINE-EPINEPHRINE 1 %-1:100000 IJ SOLN
INTRAMUSCULAR | Status: DC | PRN
  Administered 2023-08-24: 4.5 mL

## 2023-08-24 MED ORDER — SODIUM CHLORIDE 0.9 % IV SOLN: 12.5000 mg | INTRAVENOUS | Status: DC | PRN

## 2023-08-24 MED ORDER — CHLORHEXIDINE GLUCONATE 0.12 % MT SOLN: 15.0000 mL | Freq: Once | OROMUCOSAL | Status: AC

## 2023-08-24 MED ORDER — ORAL CARE MOUTH RINSE: 15.0000 mL | Freq: Once | OROMUCOSAL | Status: DC

## 2023-08-24 MED ORDER — PROPOFOL 10 MG/ML IV BOLUS
INTRAVENOUS | Status: DC | PRN
  Administered 2023-08-24: 120 mg via INTRAVENOUS

## 2023-08-24 MED ORDER — LIDOCAINE-EPINEPHRINE 1 %-1:100000 IJ SOLN
INTRAMUSCULAR | Status: AC
  Filled 2023-08-24: qty 1

## 2023-08-24 MED ORDER — LACTATED RINGERS IV SOLN: INTRAVENOUS | Status: DC

## 2023-08-24 MED ORDER — AMISULPRIDE (ANTIEMETIC) 5 MG/2ML IV SOLN
10.0000 mg | Freq: Once | INTRAVENOUS | Status: DC | PRN
Start: 2023-08-24 — End: 2023-08-24

## 2023-08-24 MED ORDER — LIDOCAINE 2% (20 MG/ML) 5 ML SYRINGE
INTRAMUSCULAR | Status: DC | PRN
  Administered 2023-08-24: 100 mg via INTRAVENOUS

## 2023-08-24 MED ORDER — HYDROMORPHONE HCL 1 MG/ML IJ SOLN: 0.2500 mg | INTRAMUSCULAR | Status: DC | PRN

## 2023-08-24 MED ORDER — OXYMETAZOLINE HCL 0.05 % NA SOLN
NASAL | Status: AC
Start: 2023-08-24 — End: 2023-08-24
  Filled 2023-08-24: qty 30

## 2023-08-24 MED ORDER — ONDANSETRON HCL 4 MG/2ML IJ SOLN
INTRAMUSCULAR | Status: AC
  Filled 2023-08-24: qty 2

## 2023-08-24 MED ORDER — ORAL CARE MOUTH RINSE
15.0000 mL | Freq: Once | OROMUCOSAL | Status: AC
Start: 2023-08-24 — End: 2023-08-24

## 2023-08-24 MED ORDER — CEFAZOLIN SODIUM-DEXTROSE 2-4 GM/100ML-% IV SOLN
INTRAVENOUS | Status: AC
  Filled 2023-08-24: qty 100

## 2023-08-24 MED ORDER — CHLORHEXIDINE GLUCONATE 0.12 % MT SOLN
OROMUCOSAL | Status: AC
  Administered 2023-08-24: 15 mL via OROMUCOSAL
  Filled 2023-08-24: qty 15

## 2023-08-24 MED ORDER — CEFAZOLIN SODIUM-DEXTROSE 2-3 GM-%(50ML) IV SOLR
INTRAVENOUS | Status: DC | PRN
Start: 2023-08-24 — End: 2023-08-24
  Administered 2023-08-24: 2 g via INTRAVENOUS

## 2023-08-24 MED ORDER — FENTANYL CITRATE (PF) 250 MCG/5ML IJ SOLN
INTRAMUSCULAR | Status: AC
  Filled 2023-08-24: qty 5

## 2023-08-24 MED ORDER — EPHEDRINE 5 MG/ML INJ
INTRAVENOUS | Status: AC
  Filled 2023-08-24: qty 5

## 2023-08-24 MED ORDER — SUGAMMADEX SODIUM 200 MG/2ML IV SOLN
INTRAVENOUS | Status: DC | PRN
  Administered 2023-08-24: 200 mg via INTRAVENOUS

## 2023-08-24 MED ORDER — CHLORHEXIDINE GLUCONATE 0.12 % MT SOLN: 15.0000 mL | Freq: Once | OROMUCOSAL | Status: DC

## 2023-08-24 MED ORDER — FENTANYL CITRATE (PF) 250 MCG/5ML IJ SOLN
INTRAMUSCULAR | Status: DC | PRN
  Administered 2023-08-24: 25 ug via INTRAVENOUS
  Administered 2023-08-24: 50 ug via INTRAVENOUS

## 2023-08-24 MED ORDER — BACITRACIN ZINC 500 UNIT/GM EX OINT
TOPICAL_OINTMENT | CUTANEOUS | Status: DC | PRN
  Administered 2023-08-24: 1 via TOPICAL

## 2023-08-24 MED ORDER — ACETAMINOPHEN 10 MG/ML IV SOLN
INTRAVENOUS | Status: DC | PRN
  Administered 2023-08-24: 1000 mg via INTRAVENOUS

## 2023-08-24 MED ORDER — ACETAMINOPHEN 10 MG/ML IV SOLN
INTRAVENOUS | Status: AC
  Filled 2023-08-24: qty 100

## 2023-08-24 MED ORDER — PHENYLEPHRINE 80 MCG/ML (10ML) SYRINGE FOR IV PUSH (FOR BLOOD PRESSURE SUPPORT)
PREFILLED_SYRINGE | INTRAVENOUS | Status: DC | PRN
  Administered 2023-08-24: 160 ug via INTRAVENOUS
  Administered 2023-08-24: 120 ug via INTRAVENOUS
  Administered 2023-08-24 (×2): 160 ug via INTRAVENOUS

## 2023-08-24 MED ORDER — OXYCODONE HCL 5 MG/5ML PO SOLN: 5.0000 mg | Freq: Once | ORAL | Status: DC | PRN

## 2023-08-24 MED ORDER — DEXAMETHASONE SODIUM PHOSPHATE 10 MG/ML IJ SOLN
INTRAMUSCULAR | Status: DC | PRN
  Administered 2023-08-24: 10 mg via INTRAVENOUS

## 2023-08-24 MED ORDER — MIDAZOLAM HCL 2 MG/2ML IJ SOLN
INTRAMUSCULAR | Status: AC
  Filled 2023-08-24: qty 2

## 2023-08-24 MED ORDER — OXYMETAZOLINE HCL 0.05 % NA SOLN
NASAL | Status: DC | PRN
  Administered 2023-08-24: 1 via TOPICAL

## 2023-08-24 MED ORDER — ONDANSETRON HCL 4 MG/2ML IJ SOLN
INTRAMUSCULAR | Status: DC | PRN
  Administered 2023-08-24: 4 mg via INTRAVENOUS

## 2023-08-24 MED ORDER — BACITRACIN ZINC 500 UNIT/GM EX OINT
TOPICAL_OINTMENT | CUTANEOUS | Status: AC
  Filled 2023-08-24: qty 28.35

## 2023-08-24 SURGICAL SUPPLY — 24 items
BAG COUNTER SPONGE SURGICOUNT (BAG) ×1 IMPLANT
CANISTER SUCTION 3000ML PPV (SUCTIONS) ×1 IMPLANT
COAGULATOR SUCT SWTCH 10FR 6 (ELECTROSURGICAL) ×1 IMPLANT
COVER SURGICAL LIGHT HANDLE (MISCELLANEOUS) IMPLANT
DRAPE HALF SHEET 40X57 (DRAPES) IMPLANT
ELECTRODE REM PT RTRN 9FT ADLT (ELECTROSURGICAL) ×1 IMPLANT
GAUZE SPONGE 2X2 8PLY STRL LF (GAUZE/BANDAGES/DRESSINGS) ×1 IMPLANT
GAUZE SPONGE 2X2 STRL 8-PLY (GAUZE/BANDAGES/DRESSINGS) IMPLANT
GLOVE ECLIPSE 7.5 STRL STRAW (GLOVE) ×1 IMPLANT
GOWN STRL REUS W/ TWL LRG LVL3 (GOWN DISPOSABLE) ×2 IMPLANT
KIT BASIN OR (CUSTOM PROCEDURE TRAY) ×1 IMPLANT
KIT TURNOVER KIT B (KITS) ×1 IMPLANT
NDL HYPO 25GX1X1/2 BEV (NEEDLE) ×1 IMPLANT
NEEDLE HYPO 25GX1X1/2 BEV (NEEDLE) ×1 IMPLANT
NS IRRIG 1000ML POUR BTL (IV SOLUTION) ×1 IMPLANT
PAD ARMBOARD POSITIONER FOAM (MISCELLANEOUS) ×2 IMPLANT
SPLINT NASAL DOYLE BI-VL (GAUZE/BANDAGES/DRESSINGS) ×1 IMPLANT
SPONGE NEURO XRAY DETECT 1X3 (DISPOSABLE) ×1 IMPLANT
SUT CHROMIC 4 0 SH 27 (SUTURE) ×1 IMPLANT
SUT PLAIN 4 0 ~~LOC~~ 1 (SUTURE) ×1 IMPLANT
SUT PROLENE 3 0 PS 2 (SUTURE) ×1 IMPLANT
TRAY ENT MC OR (CUSTOM PROCEDURE TRAY) ×1 IMPLANT
TUBE SALEM SUMP 16F (TUBING) IMPLANT
TUBING EXTENTION W/L.L. (IV SETS) IMPLANT

## 2023-08-24 NOTE — Op Note (Signed)
 DATE OF PROCEDURE: 08/24/2023  OPERATIVE REPORT   SURGEON: Reynold Caves, MD   PREOPERATIVE DIAGNOSES:  1. Severe nasal septal deviation.  2. Bilateral inferior turbinate hypertrophy.  3. Chronic nasal obstruction.  POSTOPERATIVE DIAGNOSES:  1. Severe nasal septal deviation.  2. Bilateral inferior turbinate hypertrophy.  3. Chronic nasal obstruction.  PROCEDURE PERFORMED:  1. Septoplasty.  2. Bilateral partial inferior turbinate resection.   ANESTHESIA: General endotracheal tube anesthesia.   COMPLICATIONS: None.   ESTIMATED BLOOD LOSS: 100 mL.   INDICATION FOR PROCEDURE: Riley [name] is a 78 y.o. male with a history of chronic nasal obstruction. The patient was treated with antihistamine, decongestant, and steroid nasal sprays. However, the patient continued to be symptomatic. On examination, the patient was noted to have bilateral severe inferior turbinate hypertrophy and significant nasal septal deviation, causing significant nasal obstruction. Based on the above findings, the decision was made for the patient to undergo the above-stated procedures. The risks, benefits, alternatives, and details of the procedures were discussed with the patient. Questions were invited and answered. Informed consent was obtained.   DESCRIPTION OF PROCEDURE: The patient was taken to the operating room and placed supine on the operating table. General endotracheal tube anesthesia was administered by the anesthesiologist. The patient was positioned, and prepped and draped in the standard fashion for nasal surgery. Pledgets soaked with Afrin were placed in both nasal cavities for decongestion. The pledgets were subsequently removed.   Examination of the nasal cavity revealed a severe nasal septal deviation. 1% lidocaine  with 1:100,000 epinephrine was injected onto the nasal septum bilaterally. A hemitransfixion incision was made on the left side. The mucosal flap was carefully elevated on the left side. A  cartilaginous incision was made 1 cm superior to the caudal margin of the nasal septum. Mucosal flap was also elevated on the right side in the similar fashion. It should be noted that due to the severe septal deviation, the deviated portion of the cartilaginous and bony septum had to be removed in piecemeal fashion. Once the deviated portions were removed, a straight midline septum was achieved. The septum was then quilted with 4-0 plain gut sutures. The hemitransfixion incision was closed with interrupted 4-0 chromic sutures.   The inferior one half of both hypertrophied inferior turbinate was crossclamped with a Kelly clamp. The inferior one half of each inferior turbinate was then resected with a pair of cross cutting scissors. Hemostasis was achieved with a suction cautery device.  Doyle splints were applied to the nasal septum.  The care of the patient was turned over to the anesthesiologist. The patient was awakened from anesthesia without difficulty. The patient was extubated and transferred to the recovery room in good condition.   OPERATIVE FINDINGS: Severe nasal septal deviation and bilateral inferior turbinate hypertrophy.   SPECIMEN: None.   FOLLOWUP CARE: The patient be discharged home once he is awake and alert.  The patient will follow up in my office in 3 days for splint removal.   Soleil Mas Max Spain, MD

## 2023-08-24 NOTE — H&P (Signed)
 CC: Chronic nasal obstruction   HPI:  Riley [name] is a 78 y.o. male who presents today complaining of chronic nasal obstruction for 10+ years.  The severity of his nasal congestion has increased over the past year.  He was previously treated with multiple OTC allergy medications, nasal saline irrigation, Flonase , Nasacort, and Afrin.  However, he continues to be symptomatic.  He also reports occasional nasal pain and raw sensation in his nose.  He has frequent nasal drainage.  He has no previous nasal surgery.  The patient has a history of obstructive sleep apnea, and is currently using a CPAP machine at night.  He also has a history of A-fib, and is on Coumadin .       Past Medical History:  Diagnosis Date   Chronic anticoagulation     Chronic back pain     Diabetes mellitus, type 2 (HCC)      no insulin    Edema     GERD (gastroesophageal reflux disease)     Hyperlipidemia     Hypertension      normal coronary angiography and normal EF in 12/98   Nodule of left lung      Granuloma in left upper lobe   Paroxysmal atrial fibrillation (HCC)      12/2005; sinus bradycardia secondary to medications   Sleep apnea                 Past Surgical History:  Procedure Laterality Date   APPENDECTOMY   1998   COLONOSCOPY N/A 11/26/2012    QMV:HQION 2 hemorrhoids. Pancolonic diverticulosis. Colonic polyps removed as described above. One hemostasis clip applied. TUBULAR ADENOMA. Surveillance Sept 2019.    COLONOSCOPY N/A 02/06/2018    two 3-5 mm cecal polyps, pancolonic diverticulosis. tubular adenoma. no surveillance due to age   INSERTION OF MESH N/A 01/27/2013    Procedure: INSERTION OF MESH;  Surgeon: Beau Bound, MD;  Location: [facility];  Service: General;  Laterality: N/A;   POLYPECTOMY   02/06/2018    Procedure: POLYPECTOMY;  Surgeon: Suzette Espy, MD;  Location: [facility];  Service: Endoscopy;;  (colon)   TONSILLECTOMY        as a child   UMBILICAL HERNIA REPAIR N/A 01/27/2013     Procedure: HERNIA REPAIR UMBILICAL ADULT;  Surgeon: Beau Bound, MD;  Location: [facility];  Service: General;  Laterality: N/A;               Family History  Problem Relation Age of Onset   Heart disease Father     Heart disease Mother     Bronchitis Mother     Allergies Other          whole family   Colon cancer Neg Hx            Social History:  reports that he has never smoked. He has never used smokeless tobacco. He reports that he does not drink alcohol and does not use drugs.   Allergies:  Allergies  No Known Allergies            Prior to Admission medications   Medication Sig Start Date End Date Taking? Authorizing Provider  acetaminophen  (TYLENOL ) 500 MG tablet Take 500 mg by mouth every 6 (six) hours as needed (for pain.).     Yes [provider]  azelastine  (ASTELIN ) 0.1 % nasal spray Place 1 spray into both nostrils 2 (two) times daily. Use in each nostril as directed  12/08/22   Yes Antonio Baumgarten, NP  calcium  carbonate (TUMS - DOSED IN MG ELEMENTAL CALCIUM ) 500 MG chewable tablet Chew 1 tablet by mouth as needed for indigestion or heartburn.     Yes [provider]  diltiazem  (CARDIZEM ) 30 MG tablet Take 1 tablet (30 mg total) by mouth daily as needed (Palpations). 01/01/18   Yes Flavia Hughs, MD  doxazosin  (CARDURA ) 4 MG tablet TAKE THREE TABLETS BY MOUTH EVERY EVENING 06/28/23   Yes Mallipeddi, Vishnu P, MD  glimepiride (AMARYL) 1 MG tablet Take 1 tablet by mouth daily. 12/09/18   Yes [provider]  hydrALAZINE  (APRESOLINE ) 100 MG tablet Take 1 tablet (100 mg total) by mouth 3 (three) times daily. 09/04/22   Yes Mallipeddi, Vishnu P, MD  Hydrocortisone (PREPARATION H EX) Place 1 application rectally daily as needed (irritation/discomfort).      Yes [provider]  linaclotide  (LINZESS ) 290 MCG CAPS capsule Take 1 capsule (290 mcg total) by mouth every morning. 10/04/22   Yes Delman Ferns, NP  lisinopril  (ZESTRIL ) 20  MG tablet TAKE (1) TABLET BY MOUTH ONCE DAILY. 06/28/23   Yes Mallipeddi, Vishnu P, MD  Magnesium  Oxide 200 MG TABS Take 1 tablet (200 mg total) by mouth daily. 11/02/16   Yes Tania Familia, NP  metFORMIN  (GLUCOPHAGE ) 1000 MG tablet Take 1 tablet by mouth 2 (two) times daily. 12/09/18   Yes [provider]  Multiple Vitamin (MULTIVITAMIN) tablet Take 1 tablet by mouth daily.     Yes [provider]  pantoprazole  (PROTONIX ) 40 MG tablet TAKE ONE TABLET BY MOUTH ONCE DAILY 30 MINUTES BEFORE BREAKFAST 04/08/21   Yes Delman Ferns, NP  Phenyleph-Chlorphen-Hydrocod [facility] HD PO) Take by mouth as needed.     Yes [provider]  pravastatin  (PRAVACHOL ) 20 MG tablet Take 1 tablet (20 mg total) by mouth every evening. 06/28/23   Yes Mallipeddi, Vishnu P, MD  Triamcinolone Acetonide (NASACORT ALLERGY 24HR NA) Place 1 spray into the nose daily as needed (congestion).     Yes [provider]  warfarin (COUMADIN ) 5 MG tablet TAKE 1 TABLET BY MOUTH ONCE DAILY, EXCEPT TAKE 1 AND 1/2 TABLETS ON SATURDAYS OR AS DIRECTED. 03/28/23   Yes Mallipeddi, Vishnu P, MD  chlorthalidone  (HYGROTON ) 25 MG tablet Take 0.5 tablets (12.5 mg total) by mouth every other day. 09/04/22 12/03/22   Mallipeddi, Vishnu P, MD  furosemide  (LASIX ) 20 MG tablet Take 1 tablet (20 mg total) by mouth daily. 10/18/22 01/16/23   Mallipeddi, Vishnu P, MD  metoprolol  tartrate (LOPRESSOR ) 50 MG tablet Take 1 tablet (50 mg total) by mouth 2 (two) times daily. 10/30/22 01/28/23   Mallipeddi, Vishnu P, MD      Blood pressure 138/78, pulse 76, height 6' (1.829 m), weight 220 lb (99.8 kg), SpO2 92%. Exam: General: Communicates without difficulty, well nourished, no acute distress. Head: Normocephalic, no evidence injury, no tenderness, facial buttresses intact without stepoff. Face/sinus: No tenderness to palpation and percussion. Facial movement is normal and symmetric. Eyes: PERRL, EOMI. No scleral icterus, conjunctivae  clear. Neuro: CN II exam reveals vision grossly intact.  No nystagmus at any point of gaze. Ears: Auricles well formed without lesions.  Ear canals are intact without mass or lesion.  No erythema or edema is appreciated.  The TMs are intact without fluid. Nose: External evaluation reveals normal support and skin without lesions.  Dorsum is intact.  Anterior rhinoscopy reveals congested mucosa over anterior aspect of inferior  turbinates and deviated septum.  No purulence noted. Oral:  Oral cavity and oropharynx are intact, symmetric, without erythema or edema.  Mucosa is moist without lesions. Neck: Full range of motion without pain.  There is no significant lymphadenopathy.  No masses palpable.  Thyroid bed within normal limits to palpation.  Parotid glands and submandibular glands equal bilaterally without mass.  Trachea is midline. Neuro:  CN 2-12 grossly intact.    Assessment: 1.  Chronic rhinitis with nasal mucosal congestion, severe nasal septal deviation, and bilateral inferior turbinate hypertrophy.  More than 95% of his nasal passageways are obstructed bilaterally. 2.  No suspicious mass or lesion is noted today. 3.  The patient has not responded to medical treatment, including the use of Flonase , Nasacort, OTC allergy medications, and nasal saline irrigation.  He has been symptomatic for 10+ years.   Plan: 1.  The physical exam findings are reviewed with the patient. 2.  Continue with Flonase  nasal spray 2 sprays each nostril daily.  The importance of consistent daily use is discussed. 3.  In light of his persistent symptoms, he may benefit from surgical intervention with septoplasty and bilateral turbinate reduction.  The risk, benefits, and details of the procedures are extensively discussed.  Questions are invited and answered. 4.  The patient would like to proceed with the procedures.  We will schedule the procedures in accordance with the patient's schedule. 5.  The patient will need to be  off Coumadin  for 5 days prior to his surgery.  The patient should consult with his cardiologist regarding his anticoagulation regimen.

## 2023-08-24 NOTE — Anesthesia Procedure Notes (Addendum)
 Procedure Name: Intubation Date/Time: 08/24/2023 1:40 PM  Performed by: Tytan Sandate, CRNAPre-anesthesia Checklist: Patient identified, Emergency Drugs available, Suction available and Patient being monitored Patient Re-evaluated:Patient Re-evaluated prior to induction Oxygen Delivery Method: Circle System Utilized Preoxygenation: Pre-oxygenation with 100% oxygen Induction Type: IV induction Ventilation: Oral airway inserted - appropriate to patient size Laryngoscope Size: Mac and 4 Grade View: Grade I Tube type: Oral Rae Tube size: 7.5 mm Number of attempts: 1 Airway Equipment and Method: Stylet Placement Confirmation: positive ETCO2, CO2 detector, ETT inserted through vocal cords under direct vision and breath sounds checked- equal and bilateral Secured at: 21 cm Tube secured with: Tape Dental Injury: Teeth and Oropharynx as per pre-operative assessment  Comments: Lips, tongue and teeth unchanged. Head and neck midline. ETT placed through vocal cords with ease.

## 2023-08-24 NOTE — Discharge Instructions (Addendum)

## 2023-08-24 NOTE — Transfer of Care (Signed)
 Immediate Anesthesia Transfer of Care Note  Patient: Riley [name]  Procedure(s) Performed: SEPTOPLASTY, NOSE, WITH NASAL TURBINATE REDUCTION (Bilateral: Nose)  Patient Location: PACU  Anesthesia Type:General  Level of Consciousness: alert  and oriented  Airway & Oxygen Therapy: Patient Spontanous Breathing and Patient connected to face mask oxygen  Post-op Assessment: Report given to RN and Post -op Vital signs reviewed and stable  Post vital signs: Reviewed and stable  Last Vitals:  Vitals Value Taken Time  BP 134/91 08/24/23 14:36  Temp    Pulse 73 08/24/23 14:40  Resp 15 08/24/23 14:40  SpO2 96 % 08/24/23 14:40  Vitals shown include unfiled device data.  Last Pain:  Vitals:   08/24/23 1127  TempSrc:   PainSc: 0-No pain         Complications: There were no known notable events for this encounter.

## 2023-08-25 NOTE — Anesthesia Postprocedure Evaluation (Signed)
 Anesthesia Post Note  Patient: Riley [name]  Procedure(s) Performed: SEPTOPLASTY, NOSE, WITH NASAL TURBINATE REDUCTION (Bilateral: Nose)     Patient location during evaluation: PACU Anesthesia Type: General Level of consciousness: awake and alert Pain management: pain level controlled Vital Signs Assessment: post-procedure vital signs reviewed and stable Respiratory status: spontaneous breathing, nonlabored ventilation and respiratory function stable Cardiovascular status: blood pressure returned to baseline and stable Postop Assessment: no apparent nausea or vomiting Anesthetic complications: no   There were no known notable events for this encounter.  Last Vitals:  Vitals:   08/24/23 1515 08/24/23 1523  BP: (!) 156/95 (!) 152/97  Pulse: 68 73  Resp: (!) 9 15  Temp:  36.5 C  SpO2: 94% 93%    Last Pain:  Vitals:   08/24/23 1523  TempSrc:   PainSc: 0-No pain                 Butler Levander Pinal

## 2023-08-26 ENCOUNTER — Encounter (HOSPITAL_COMMUNITY): Payer: Self-pay | Admitting: Otolaryngology

## 2023-08-27 ENCOUNTER — Ambulatory Visit (INDEPENDENT_AMBULATORY_CARE_PROVIDER_SITE_OTHER): Admitting: Otolaryngology

## 2023-08-27 ENCOUNTER — Encounter (INDEPENDENT_AMBULATORY_CARE_PROVIDER_SITE_OTHER): Payer: Self-pay | Admitting: Otolaryngology

## 2023-08-27 VITALS — BP 143/89 | HR 78

## 2023-08-27 DIAGNOSIS — J310 Chronic rhinitis: Secondary | ICD-10-CM

## 2023-08-27 NOTE — Progress Notes (Signed)
 Doyle splints removed. Septum and turbinates are healing well.   Both [place] debrided.  Nasal saline irrigation.  Recheck in 3 weeks.

## 2023-08-28 DIAGNOSIS — I2510 Atherosclerotic heart disease of native coronary artery without angina pectoris: Secondary | ICD-10-CM | POA: Diagnosis not present

## 2023-08-28 DIAGNOSIS — I4891 Unspecified atrial fibrillation: Secondary | ICD-10-CM

## 2023-08-28 DIAGNOSIS — J343 Hypertrophy of nasal turbinates: Secondary | ICD-10-CM

## 2023-08-28 DIAGNOSIS — J342 Deviated nasal septum: Secondary | ICD-10-CM | POA: Diagnosis not present

## 2023-09-03 ENCOUNTER — Ambulatory Visit

## 2023-09-04 ENCOUNTER — Ambulatory Visit: Attending: Cardiology | Admitting: *Deleted

## 2023-09-04 DIAGNOSIS — I480 Paroxysmal atrial fibrillation: Secondary | ICD-10-CM | POA: Diagnosis not present

## 2023-09-04 DIAGNOSIS — Z5181 Encounter for therapeutic drug level monitoring: Secondary | ICD-10-CM

## 2023-09-04 LAB — POCT INR: INR: 1.5 — AB (ref 2.0–3.0)

## 2023-09-04 NOTE — Patient Instructions (Signed)
 Take warfarin 1 1/2 tablets tonight and tomorrow night then resume 1 tablet daily except 1 1/2 tablets Saturdays  S/P septoplasty 08/24/23 Recheck 2 wks

## 2023-09-04 NOTE — Progress Notes (Signed)
Please see anticoagulation encounter.

## 2023-09-17 ENCOUNTER — Encounter (INDEPENDENT_AMBULATORY_CARE_PROVIDER_SITE_OTHER): Payer: Self-pay | Admitting: Otolaryngology

## 2023-09-17 ENCOUNTER — Ambulatory Visit (INDEPENDENT_AMBULATORY_CARE_PROVIDER_SITE_OTHER): Admitting: Otolaryngology

## 2023-09-17 VITALS — BP 142/101 | HR 76

## 2023-09-17 DIAGNOSIS — J310 Chronic rhinitis: Secondary | ICD-10-CM

## 2023-09-17 DIAGNOSIS — Z9889 Other specified postprocedural states: Secondary | ICD-10-CM

## 2023-09-17 NOTE — Progress Notes (Signed)
 Septum and turbinates are healing well.   Both [place] debrided.   Nasal saline irrigation as needed.   Recheck in 6 months.

## 2023-09-18 ENCOUNTER — Ambulatory Visit: Attending: Cardiology | Admitting: *Deleted

## 2023-09-18 DIAGNOSIS — Z5181 Encounter for therapeutic drug level monitoring: Secondary | ICD-10-CM

## 2023-09-18 DIAGNOSIS — I480 Paroxysmal atrial fibrillation: Secondary | ICD-10-CM

## 2023-09-18 LAB — POCT INR: INR: 3 (ref 2.0–3.0)

## 2023-09-18 NOTE — Progress Notes (Signed)
Please see anticoagulation encounter.

## 2023-09-18 NOTE — Patient Instructions (Signed)
 Continue 1 tablet daily except 1 1/2 tablets Saturdays  S/P septoplasty 08/24/23 Recheck 4 wks

## 2023-09-19 ENCOUNTER — Ambulatory Visit (INDEPENDENT_AMBULATORY_CARE_PROVIDER_SITE_OTHER): Admitting: Otolaryngology

## 2023-10-16 ENCOUNTER — Ambulatory Visit: Attending: Cardiology | Admitting: *Deleted

## 2023-10-16 DIAGNOSIS — Z5181 Encounter for therapeutic drug level monitoring: Secondary | ICD-10-CM

## 2023-10-16 DIAGNOSIS — I480 Paroxysmal atrial fibrillation: Secondary | ICD-10-CM

## 2023-10-16 LAB — POCT INR: INR: 2.7 (ref 2.0–3.0)

## 2023-10-16 NOTE — Patient Instructions (Signed)
 Continue 1 tablet daily except 1 1/2 tablets Saturdays  S/P septoplasty 08/24/23 Recheck 4 wks

## 2023-10-16 NOTE — Progress Notes (Signed)
 INR 2.7; Please see anticoagulation encounter

## 2023-10-27 ENCOUNTER — Ambulatory Visit: Admission: EM | Admit: 2023-10-27 | Discharge: 2023-10-27 | Disposition: A | Discharge status: Home / Self Care

## 2023-10-27 ENCOUNTER — Other Ambulatory Visit: Payer: Self-pay | Admitting: Gastroenterology

## 2023-10-27 DIAGNOSIS — K5900 Constipation, unspecified: Secondary | ICD-10-CM

## 2023-10-27 DIAGNOSIS — J3489 Other specified disorders of nose and nasal sinuses: Secondary | ICD-10-CM | POA: Diagnosis not present

## 2023-10-27 DIAGNOSIS — R252 Cramp and spasm: Secondary | ICD-10-CM

## 2023-10-27 DIAGNOSIS — J3089 Other allergic rhinitis: Secondary | ICD-10-CM

## 2023-10-27 NOTE — ED Triage Notes (Signed)
 Pt reports body aches in the calves, legs , and chest ongoing since x 4 days. Feels like a burning and soreness, pressure om the eyes. States he recently had surgery on his nose. Was told by surgeon that he would have fatigue for about 3 months.

## 2023-10-27 NOTE — Discharge Instructions (Signed)
 Continue taking your Xyzal for seasonal allergies, using nasal saline, humidifiers and other supportive measures.  Make sure to drink plenty of water  and include electrolyte solutions as I feel this may help your muscle cramping.  Follow-up with your primary care provider Monday if not improving and return sooner for worsening symptoms.

## 2023-10-28 NOTE — ED Provider Notes (Signed)
 [facility]    CSN: [id number] Arrival date & time: 10/27/23  1051      History   Chief Complaint No chief complaint on file.   HPI Riley [name] is a 78 y.o. male.   Patient presenting today with multiple complaints.  States the past few days he has been having muscle cramping in the calves, upper legs, and upper body that come and go but are worse in the morning.  He denies redness, swelling, bruising, chest tightness, shortness of breath, palpitations, dizziness.  So far not trying anything over-the-counter for symptoms but does note that he just recently restarted his Lasix  for lower leg edema and has not been drinking much water  recently.  Also having some ongoing issues with burning and soreness in his sinuses, runny nose, eye pressure.  Had sinus surgery several months ago and was told that it would take several months to feel back to baseline and is unsure if this is related or if this is more his seasonal allergies.  Restarted Xyzal yesterday, has not noticed benefit just yet.    Past Medical History:  Diagnosis Date   Chronic anticoagulation    Chronic back pain    Diabetes mellitus, type 2 (HCC)    no insulin    Edema    GERD (gastroesophageal reflux disease)    Hyperlipidemia    Hypertension    normal coronary angiography and normal EF in 12/98   Nodule of left lung    Granuloma in left upper lobe   Paroxysmal atrial fibrillation (HCC)    12/2005; sinus bradycardia secondary to medications   Sleep apnea     Patient Active Problem List   Diagnosis Date Noted   Chronic rhinitis 07/13/2023   Deviated nasal septum 07/13/2023   Hypertrophy of nasal turbinates 07/13/2023   Persistent atrial fibrillation (HCC) 04/08/2023   Nasal dryness 12/08/2022   Chronic venous insufficiency 09/07/2020   Left heart failure (HCC) 01/16/2019   Proteinuria 01/16/2019   Abdominal pain 10/09/2018   GERD (gastroesophageal reflux disease) 12/20/2017   H/O adenomatous polyp  of colon 12/20/2017   Atrial paroxysmal tachycardia (HCC) 08/01/2017   Coronary arteriosclerosis 08/01/2017   Stage 2 chronic kidney disease 08/01/2017   Type 2 diabetes mellitus with diabetic nephropathy (HCC) 08/01/2017   Morbid obesity (HCC) 02/11/2015   Encounter for therapeutic drug monitoring 04/10/2013   Constipation 11/20/2012   Hemorrhoids 11/20/2012   OSA (obstructive sleep apnea) 06/26/2012   Seasonal allergies 04/29/2012   Paroxysmal atrial fibrillation (HCC)    Chronic anticoagulation    Hypertension    Diabetes mellitus type II, controlled (HCC)    Hyperlipidemia    Pedal edema 06/08/2009    Past Surgical History:  Procedure Laterality Date   APPENDECTOMY  1998   COLONOSCOPY N/A 11/26/2012   MFM:Hmjiz 2 hemorrhoids. Pancolonic diverticulosis. Colonic polyps removed as described above. One hemostasis clip applied. TUBULAR ADENOMA. Surveillance Sept 2019.    COLONOSCOPY N/A 02/06/2018   two 3-5 mm cecal polyps, pancolonic diverticulosis. tubular adenoma. no surveillance due to age   INSERTION OF MESH N/A 01/27/2013   Procedure: INSERTION OF MESH;  Surgeon: Oneil DELENA Budge, MD;  Location: [facility];  Service: General;  Laterality: N/A;   NASAL SEPTOPLASTY W/ TURBINOPLASTY Bilateral 08/24/2023   Procedure: SEPTOPLASTY, NOSE, WITH NASAL TURBINATE REDUCTION;  Surgeon: Karis Clunes, MD;  Location: [facility] OR;  Service: ENT;  Laterality: Bilateral;   POLYPECTOMY  02/06/2018   Procedure: POLYPECTOMY;  Surgeon: Shaaron Lamar HERO, MD;  Location: [facility];  Service: Endoscopy;;  (colon)   TONSILLECTOMY     as a child   UMBILICAL HERNIA REPAIR N/A 01/27/2013   Procedure: HERNIA REPAIR UMBILICAL ADULT;  Surgeon: Oneil DELENA Budge, MD;  Location: [facility];  Service: General;  Laterality: N/A;       Home Medications    Prior to Admission medications   Medication Sig Start Date End Date Taking? Authorizing Provider  acetaminophen  (TYLENOL ) 500 MG tablet Take 500 mg by mouth every 6 (six)  hours as needed (for pain.).    [provider]  calcium  carbonate (TUMS - DOSED IN MG ELEMENTAL CALCIUM ) 500 MG chewable tablet Chew 1 tablet by mouth as needed for indigestion or heartburn.    [provider]  chlorthalidone  (HYGROTON ) 25 MG tablet Take 0.5 tablets (12.5 mg total) by mouth every other day. 09/04/22 09/17/23  Mallipeddi, Vishnu P, MD  diltiazem  (CARDIZEM ) 30 MG tablet Take 1 tablet (30 mg total) by mouth daily as needed (Palpations). 01/01/18   Charls Pearla DELENA, MD  doxazosin  (CARDURA ) 4 MG tablet TAKE THREE TABLETS BY MOUTH EVERY EVENING 06/28/23   Mallipeddi, Vishnu P, MD  furosemide  (LASIX ) 20 MG tablet Take 1 tablet (20 mg total) by mouth daily. Patient taking differently: Take 20 mg by mouth daily as needed for fluid or edema. 10/18/22 09/17/23  Mallipeddi, Vishnu P, MD  glimepiride (AMARYL) 1 MG tablet Take 1 mg by mouth daily. 12/09/18   [provider]  hydrALAZINE  (APRESOLINE ) 100 MG tablet Take 1 tablet (100 mg total) by mouth 3 (three) times daily. 09/04/22   Mallipeddi, Vishnu P, MD  Hydrocortisone (PREPARATION H EX) Place 1 application rectally daily as needed (irritation/discomfort).     [provider]  linaclotide  (LINZESS ) 290 MCG CAPS capsule Take 1 capsule (290 mcg total) by mouth every morning. Patient taking differently: Take 290 mcg by mouth daily as needed (Stomach). 10/04/22   Shirlean Therisa ORN, NP  lisinopril  (ZESTRIL ) 20 MG tablet TAKE (1) TABLET BY MOUTH ONCE DAILY. 06/28/23   Mallipeddi, Vishnu P, MD  Magnesium  Oxide 200 MG TABS Take 1 tablet (200 mg total) by mouth daily. 11/02/16   Jerilynn Lamarr HERO, NP  metFORMIN  (GLUCOPHAGE ) 1000 MG tablet Take 1,000 mg by mouth 2 (two) times daily. 12/09/18   [provider]  metoprolol  tartrate (LOPRESSOR ) 50 MG tablet Take 1 tablet (50 mg total) by mouth 2 (two) times daily. 07/19/23 10/17/23  Mallipeddi, Vishnu P, MD  Multiple Vitamin (MULTIVITAMIN) tablet Take 1 tablet by mouth  daily.    [provider]  pantoprazole  (PROTONIX ) 40 MG tablet TAKE ONE TABLET BY MOUTH ONCE DAILY 30 MINUTES BEFORE BREAKFAST 04/08/21   Shirlean Therisa ORN, NP  Phenyleph-Chlorphen-Hydrocod [facility] HD PO) Take 1 tablet by mouth daily as needed (Allergies).    [provider]  pravastatin  (PRAVACHOL ) 20 MG tablet Take 1 tablet (20 mg total) by mouth every evening. 06/28/23   Mallipeddi, Diannah SQUIBB, MD    Family History Family History  Problem Relation Age of Onset   Heart disease Father    Heart disease Mother    Bronchitis Mother    Allergies Other        whole family   Colon cancer Neg Hx     Social History Social History   Tobacco Use   Smoking status: Never   Smokeless tobacco: Never  Vaping Use   Vaping status: Never Used  Substance Use Topics   Alcohol use: No  Alcohol/week: 0.0 standard drinks of alcohol   Drug use: Never     Allergies   Patient has no known allergies.   Review of Systems Review of Systems PER HPI  Physical Exam Triage Vital Signs ED Triage Vitals  Encounter Vitals Group     BP 10/27/23 1056 125/75     Girls Systolic BP Percentile --      Girls Diastolic BP Percentile --      Boys Systolic BP Percentile --      Boys Diastolic BP Percentile --      Pulse Rate 10/27/23 1056 88     Resp 10/27/23 1056 18     Temp 10/27/23 1056 98 F (36.7 C)     Temp Source 10/27/23 1056 Oral     SpO2 10/27/23 1056 96 %     Weight --      Height --      Head Circumference --      Peak Flow --      Pain Score 10/27/23 1107 8     Pain Loc --      Pain Education --      Exclude from Growth Chart --    No data found.  Updated Vital Signs BP 125/75 (BP Location: Right Arm)   Pulse 88   Temp 98 F (36.7 C) (Oral)   Resp 18   SpO2 96%   Visual Acuity Right Eye Distance:   Left Eye Distance:   Bilateral Distance:    Right Eye Near:   Left Eye Near:    Bilateral Near:     Physical Exam Vitals and nursing note reviewed.   Constitutional:      Appearance: Normal appearance.  HENT:     Head: Atraumatic.     Nose: Rhinorrhea present.     Mouth/Throat:     Mouth: Mucous membranes are moist.     Pharynx: Posterior oropharyngeal erythema present.  Eyes:     Extraocular Movements: Extraocular movements intact.     Conjunctiva/sclera: Conjunctivae normal.  Cardiovascular:     Rate and Rhythm: Normal rate and regular rhythm.  Pulmonary:     Effort: Pulmonary effort is normal.     Breath sounds: Normal breath sounds.  Musculoskeletal:        General: No tenderness. Normal range of motion.     Cervical back: Normal range of motion and neck supple.     Comments: Negative Homans' sign bilaterally  Skin:    General: Skin is warm and dry.  Neurological:     General: No focal deficit present.     Mental Status: He is oriented to person, place, and time.     Comments: All 4 extremities neurovascularly intact  Psychiatric:        Mood and Affect: Mood normal.        Thought Content: Thought content normal.        Judgment: Judgment normal.      UC Treatments / Results  Labs (all labs ordered are listed, but only abnormal results are displayed) Labs Reviewed - No data to display  EKG   Radiology No results found.  Procedures Procedures (including critical care time)  Medications Ordered in UC Medications - No data to display  Initial Impression / Assessment and Plan / UC Course  I have reviewed the triage vital signs and the nursing notes.  Pertinent labs & imaging results that were available during my care of the patient were reviewed by me  and considered in my medical decision making (see chart for details).     Suspect his sinus pain to be related to uncontrolled seasonal allergies and recent sinus procedure.  Continue Xyzal, use nasal saline and humidifiers.  Regarding his muscle cramps, do suspect it is related to hydration and electrolyte balance.  Increase fluids and electrolytes, make  sure to stretch regularly and follow-up with PCP if worsening or not resolving. Final Clinical Impressions(s) / UC Diagnoses   Final diagnoses:  Sinus pain  Seasonal allergic rhinitis due to other allergic trigger  Muscle cramps     Discharge Instructions      Continue taking your Xyzal for seasonal allergies, using nasal saline, humidifiers and other supportive measures.  Make sure to drink plenty of water  and include electrolyte solutions as I feel this may help your muscle cramping.  Follow-up with your primary care provider Monday if not improving and return sooner for worsening symptoms.     ED Prescriptions   None    PDMP not reviewed this encounter.   Stuart Vernell Norris, [place] 10/28/23 1501

## 2023-10-29 NOTE — Telephone Encounter (Signed)
 Pt needs an appt before further refills.

## 2023-11-13 ENCOUNTER — Ambulatory Visit: Attending: Cardiology | Admitting: *Deleted

## 2023-11-13 DIAGNOSIS — Z5181 Encounter for therapeutic drug level monitoring: Secondary | ICD-10-CM | POA: Diagnosis not present

## 2023-11-13 DIAGNOSIS — I480 Paroxysmal atrial fibrillation: Secondary | ICD-10-CM | POA: Diagnosis not present

## 2023-11-13 LAB — POCT INR: INR: 2.5 (ref 2.0–3.0)

## 2023-11-13 NOTE — Patient Instructions (Signed)
 Continue 1 tablet daily except 1 1/2 tablets Saturdays  S/P septoplasty 08/24/23 Recheck 6 wks

## 2023-11-16 ENCOUNTER — Telehealth: Payer: Self-pay | Admitting: Internal Medicine

## 2023-11-16 ENCOUNTER — Telehealth: Payer: Self-pay

## 2023-11-16 NOTE — Telephone Encounter (Signed)
 SABRA

## 2023-11-16 NOTE — Telephone Encounter (Signed)
 Right shoulder medical clearance.  Patient will established care December 2025 with Gloria Zarwolo  Copied Noted Sleeved Original placed in provider box Copy placed front desk folder

## 2023-11-19 ENCOUNTER — Telehealth: Payer: Self-pay

## 2023-11-19 ENCOUNTER — Telehealth (HOSPITAL_BASED_OUTPATIENT_CLINIC_OR_DEPARTMENT_OTHER): Payer: Self-pay

## 2023-11-19 NOTE — Telephone Encounter (Signed)
 Received surgical assessment from murphy wainer.  Appt is needed.  Patient last seen 04/2023.  Lm for patient to schedule.

## 2023-11-19 NOTE — Telephone Encounter (Signed)
  Patient Consent for Virtual Visit         Riley [name] has provided verbal consent on 11/19/2023 for a virtual visit (video or telephone).  Appointment scheduled for 11/22/2023 @ 11am Med req and consent are complete. Call patient [phone].  Patient is currentlt on Warfarin 5mg . I have added to patients med list.    CONSENT FOR VIRTUAL VISIT FOR:  Riley [name]  By participating in this virtual visit I agree to the following:  I hereby voluntarily request, consent and authorize [facility] and its employed or contracted physicians, physician assistants, nurse practitioners or other licensed health care professionals (the Practitioner), to provide me with telemedicine health care services (the "Services) as deemed necessary by the treating Practitioner. I acknowledge and consent to receive the Services by the Practitioner via telemedicine. I understand that the telemedicine visit will involve communicating with the Practitioner through live audiovisual communication technology and the disclosure of certain medical information by electronic transmission. I acknowledge that I have been given the opportunity to request an in-person assessment or other available alternative prior to the telemedicine visit and am voluntarily participating in the telemedicine visit.  I understand that I have the right to withhold or withdraw my consent to the use of telemedicine in the course of my care at any time, without affecting my right to future care or treatment, and that the Practitioner or I may terminate the telemedicine visit at any time. I understand that I have the right to inspect all information obtained and/or recorded in the course of the telemedicine visit and may receive copies of available information for a reasonable fee.  I understand that some of the potential risks of receiving the Services via telemedicine include:  Delay or interruption in medical evaluation due to technological  equipment failure or disruption; Information transmitted may not be sufficient (e.g. poor resolution of images) to allow for appropriate medical decision making by the Practitioner; and/or  In rare instances, security protocols could fail, causing a breach of personal health information.  Furthermore, I acknowledge that it is my responsibility to provide information about my medical history, conditions and care that is complete and accurate to the best of my ability. I acknowledge that Practitioner's advice, recommendations, and/or decision may be based on factors not within their control, such as incomplete or inaccurate data provided by me or distortions of diagnostic images or specimens that may result from electronic transmissions. I understand that the practice of medicine is not an exact science and that Practitioner makes no warranties or guarantees regarding treatment outcomes. I acknowledge that a copy of this consent can be made available to me via my patient portal [facility] MyChart), or I can request a printed copy by calling the office of [facility].    I understand that my insurance will be billed for this visit.   I have read or had this consent read to me. I understand the contents of this consent, which adequately explains the benefits and risks of the Services being provided via telemedicine.  I have been provided ample opportunity to ask questions regarding this consent and the Services and have had my questions answered to my satisfaction. I give my informed consent for the services to be provided through the use of telemedicine in my medical care

## 2023-11-19 NOTE — Telephone Encounter (Signed)
   Pre-operative Risk Assessment    Patient Name: Riley [name]  DOB: 1945/12/12 MRN: [id number]   Date of last office visit: 04/04/23 DIANNAH MAJESTIC, MD Date of next office visit: NONE   Request for Surgical Clearance    Procedure:  RIGHT REVERSE TOTAL SHOULDER ARTHROPLASTY  Date of Surgery:  Clearance TBD                                 Surgeon:  BONNER HAIR, MD Surgeon's Group or Practice Name:  [facility] Phone number:  [phone]  EXT [phone] Fax number:  [phone]   ATTN: MAEOLA DIVERS   Type of Clearance Requested:   - Medical    Type of Anesthesia:  General W/ INTERSCALENE BLOCK   Additional requests/questions:    SignedLucie DELENA Ku   11/19/2023, 8:16 AM

## 2023-11-19 NOTE — Telephone Encounter (Signed)
   Name: Riley [name]  DOB: 20-Jun-1945  MRN: [id number]  Primary Cardiologist: Vishnu P Mallipeddi, MD   Preoperative team, please contact this patient and set up a phone call appointment for further preoperative risk assessment. Please obtain consent and complete medication review. Thank you for your help.  I confirm that guidance regarding antiplatelet and oral anticoagulation therapy has been completed and, if necessary, noted below.  I am also sending this to pharmD - pt historically on coumadin , recently seen in INR clinic, but coumadin  fell off list. Suspect surgeon will want to hold coumadin  for shoulder surgery.  I also confirmed the patient resides in the state of [place] . As per [facility] Medical Board telemedicine laws, the patient must reside in the state in which the provider is licensed.   Jon Garre Sonda Coppens, PA 11/19/2023, 10:09 AM [facility]

## 2023-11-19 NOTE — Telephone Encounter (Signed)
 1st attempt : Called patient to schedule cardiac clearance preop telehealth appointment. NA, left message on VM to contact our office.

## 2023-11-19 NOTE — Telephone Encounter (Signed)
 Appointment scheduled for 11/22/2023 @ 11am Med req and consent are complete. Call patient [phone].  Patient is currentlt on Warfarin 5mg . I have added to patients med list.

## 2023-11-20 ENCOUNTER — Other Ambulatory Visit: Payer: Self-pay | Admitting: Orthopaedic Surgery

## 2023-11-20 ENCOUNTER — Telehealth: Payer: Self-pay

## 2023-11-20 DIAGNOSIS — M25511 Pain in right shoulder: Secondary | ICD-10-CM

## 2023-11-20 NOTE — Telephone Encounter (Signed)
 Patient with diagnosis of afib on warfarin for anticoagulation.    Procedure: RIGHT REVERSE TOTAL SHOULDER ARTHROPLASTY  Date of procedure: TBD   CHA2DS2-VASc Score = 5   This indicates a 7.2% annual risk of stroke. The patient's score is based upon: CHF History: 0 HTN History: 1 Diabetes History: 1 Stroke History: 0 Vascular Disease History: 1 Age Score: 2 Gender Score: 0      Patient has not had an Afib/aflutter ablation or Watchman within the last 3 months or DCCV within the last 30 days   Per office protocol, patient can hold warfarin for 5 days prior to procedure.    Patient will not need bridging with Lovenox  (enoxaparin ) around procedure.  **This guidance is not considered finalized until pre-operative APP has relayed final recommendations.**

## 2023-11-20 NOTE — Telephone Encounter (Signed)
 Please refer to 11/19/2023 phone note.

## 2023-11-20 NOTE — Progress Notes (Signed)
 INR 2.5. Please see anticoagulation encounter

## 2023-11-20 NOTE — Telephone Encounter (Signed)
 Copied from CRM [id number]. Topic: Appointments - Appointment Scheduling >> Nov 20, 2023  8:22 AM Leila C wrote: Patient/patient representative is calling to schedule an appointment. Refer to attachments for appointment information.  Patient's spouse Adrien [phone] states patient need medical clearance for shoulder surgery (hasn't been scheduled until pulmonologist clears patient, hopefully in November 2025), returing Margie's call on getting patient to be seen sooner in [place] as new patient than waiting for Dr. Jude. Adrien does not want to schedule an appointment yet until she speak with [facility]. Per CAL, CMA is unable to send crm. Please advise and call back before 10 am, or after 12 pm.

## 2023-11-20 NOTE — Telephone Encounter (Signed)
 Lm for patient.   Dr. Darlean does not manage [facility]. Please schedule appt when pt returns call.

## 2023-11-21 ENCOUNTER — Telehealth: Payer: Self-pay

## 2023-11-21 NOTE — Telephone Encounter (Signed)
 Copied from CRM [id number]. Topic: General - Other >> Nov 20, 2023 11:48 AM Isabell A wrote: Reason for CRM: Spouse is requesting a call back from  [facility] - attempt to help her but she only wants to speak with [facility].   Callback number: [phone]  Routing to [facility]

## 2023-11-22 ENCOUNTER — Ambulatory Visit: Attending: Cardiology | Admitting: Emergency Medicine

## 2023-11-22 DIAGNOSIS — Z01810 Encounter for preprocedural cardiovascular examination: Secondary | ICD-10-CM | POA: Diagnosis not present

## 2023-11-22 NOTE — Telephone Encounter (Signed)
 Spoke to patient's wife, Debra(DPR) and scheduled appt for 11/28/2023 at 1:30. Adrien is aware of [address] location. Nothing further needed.

## 2023-11-22 NOTE — Progress Notes (Signed)
 Virtual Visit via Telephone Note   Because of Riley [name] co-morbid illnesses, he is at least at moderate risk for complications without adequate follow up.  This format is felt to be most appropriate for this patient at this time.  Due to technical limitations with video connection Web designer), today's appointment will be conducted as an audio only telehealth visit, and Riley [name] verbally agreed to proceed in this manner.   All issues noted in this document were discussed and addressed.  No physical exam could be performed with this format.  Evaluation Performed:  Preoperative cardiovascular risk assessment _____________   Date:  11/22/2023   Patient ID:  Riley [name], DOB 08-Jan-1946, MRN [id number] Patient Location:  Home Provider location:   Office  Primary Care Provider:  Bertell Satterfield, MD Primary Cardiologist:  Vishnu P Mallipeddi, MD  Chief Complaint / Patient Profile   78 y.o. y/o male with a h/o paroxysmal atrial fibrillation, hypertension, hyperlipidemia, type 2 diabetes, and OSA on CPAP who is pending right reverse total shoulder arthroplasty by Dr. Bonner Hair of [facility], scheduled TBD, and presents today for telephonic preoperative cardiovascular risk assessment.  History of Present Illness    Riley [name] is a 78 y.o. male who presents via audio/video conferencing for a telehealth visit today.  Pt was last seen in cardiology clinic on 04/04/2023 by Dr. Vishnu Mallipeddi.  At that time Riley [name] was doing well. The patient is now pending procedure as outlined above.   Since his last visit, he denies chest pain, palpitations, dyspnea, pnd, orthopnea, n, v, dizziness, syncope, edema, weight gain, or early satiety. All other systems reviewed and are otherwise negative except as noted above.   Past Medical History    Past Medical History:  Diagnosis Date   Chronic anticoagulation    Chronic back pain    Diabetes mellitus, type 2 (HCC)    no insulin     Edema    GERD (gastroesophageal reflux disease)    Hyperlipidemia    Hypertension    normal coronary angiography and normal EF in 12/98   Nodule of left lung    Granuloma in left upper lobe   Paroxysmal atrial fibrillation (HCC)    12/2005; sinus bradycardia secondary to medications   Sleep apnea    Past Surgical History:  Procedure Laterality Date   APPENDECTOMY  1998   COLONOSCOPY N/A 11/26/2012   MFM:Hmjiz 2 hemorrhoids. Pancolonic diverticulosis. Colonic polyps removed as described above. One hemostasis clip applied. TUBULAR ADENOMA. Surveillance Sept 2019.    COLONOSCOPY N/A 02/06/2018   two 3-5 mm cecal polyps, pancolonic diverticulosis. tubular adenoma. no surveillance due to age   INSERTION OF MESH N/A 01/27/2013   Procedure: INSERTION OF MESH;  Surgeon: Oneil DELENA Budge, MD;  Location: [facility];  Service: General;  Laterality: N/A;   NASAL SEPTOPLASTY W/ TURBINOPLASTY Bilateral 08/24/2023   Procedure: SEPTOPLASTY, NOSE, WITH NASAL TURBINATE REDUCTION;  Surgeon: Karis Clunes, MD;  Location: [facility] OR;  Service: ENT;  Laterality: Bilateral;   POLYPECTOMY  02/06/2018   Procedure: POLYPECTOMY;  Surgeon: Shaaron Lamar HERO, MD;  Location: [facility];  Service: Endoscopy;;  (colon)   TONSILLECTOMY     as a child   UMBILICAL HERNIA REPAIR N/A 01/27/2013   Procedure: HERNIA REPAIR UMBILICAL ADULT;  Surgeon: Oneil DELENA Budge, MD;  Location: [facility];  Service: General;  Laterality: N/A;    Allergies  No Known Allergies  Home Medications    Prior  to Admission medications   Medication Sig Start Date End Date Taking? Authorizing Provider  acetaminophen  (TYLENOL ) 500 MG tablet Take 500 mg by mouth every 6 (six) hours as needed (for pain.).    [provider]  calcium  carbonate (TUMS - DOSED IN MG ELEMENTAL CALCIUM ) 500 MG chewable tablet Chew 1 tablet by mouth as needed for indigestion or heartburn.    [provider]  chlorthalidone  (HYGROTON ) 25 MG tablet Take 0.5 tablets  (12.5 mg total) by mouth every other day. 09/04/22 09/17/23  Mallipeddi, Vishnu P, MD  diltiazem  (CARDIZEM ) 30 MG tablet Take 1 tablet (30 mg total) by mouth daily as needed (Palpations). 01/01/18   Charls Pearla LABOR, MD  doxazosin  (CARDURA ) 4 MG tablet TAKE THREE TABLETS BY MOUTH EVERY EVENING 06/28/23   Mallipeddi, Vishnu P, MD  furosemide  (LASIX ) 20 MG tablet Take 1 tablet (20 mg total) by mouth daily. Patient taking differently: Take 20 mg by mouth daily as needed for fluid or edema. 10/18/22 09/17/23  Mallipeddi, Vishnu P, MD  glimepiride (AMARYL) 1 MG tablet Take 1 mg by mouth daily. 12/09/18   [provider]  hydrALAZINE  (APRESOLINE ) 100 MG tablet Take 1 tablet (100 mg total) by mouth 3 (three) times daily. 09/04/22   Mallipeddi, Vishnu P, MD  Hydrocortisone (PREPARATION H EX) Place 1 application rectally daily as needed (irritation/discomfort).     [provider]  linaclotide  (LINZESS ) 290 MCG CAPS capsule TAKE ONE CAPSULE BY MOUTH EVERY MORNING 10/29/23   Shirlean Therisa ORN, NP  lisinopril  (ZESTRIL ) 20 MG tablet TAKE (1) TABLET BY MOUTH ONCE DAILY. 06/28/23   Mallipeddi, Vishnu P, MD  Magnesium  Oxide 200 MG TABS Take 1 tablet (200 mg total) by mouth daily. 11/02/16   Jerilynn Lamarr HERO, NP  metFORMIN  (GLUCOPHAGE ) 1000 MG tablet Take 1,000 mg by mouth 2 (two) times daily. 12/09/18   [provider]  metoprolol  tartrate (LOPRESSOR ) 50 MG tablet Take 1 tablet (50 mg total) by mouth 2 (two) times daily. 07/19/23 10/17/23  Mallipeddi, Vishnu P, MD  Multiple Vitamin (MULTIVITAMIN) tablet Take 1 tablet by mouth daily.    [provider]  pantoprazole  (PROTONIX ) 40 MG tablet TAKE ONE TABLET BY MOUTH ONCE DAILY 30 MINUTES BEFORE BREAKFAST 04/08/21   Shirlean Therisa ORN, NP  Phenyleph-Chlorphen-Hydrocod [facility] PO) Take 1 tablet by mouth daily as needed (Allergies).    [provider]  pravastatin  (PRAVACHOL ) 20 MG tablet Take 1 tablet (20 mg total) by mouth every evening.  06/28/23   Mallipeddi, Vishnu P, MD  warfarin (COUMADIN ) 5 MG tablet Take 5 mg by mouth daily. 10/27/23   [provider]    Physical Exam    Vital Signs:  Riley [name] does not have vital signs available for review today.  Given telephonic nature of communication, physical exam is limited. AAOx3. NAD. Normal affect.  Speech and respirations are unlabored.  Accessory Clinical Findings    None  Assessment & Plan    1.  Preoperative Cardiovascular Risk Assessment:  The patient was advised that if he develops new symptoms prior to surgery to contact our office to arrange for a follow-up visit, and he verbalized understanding.  According to the Revised Cardiac Risk Index (RCRI), his Perioperative Risk of Major Cardiac Event is (%): 0.4  His Functional Capacity in METs is: 8.27 according to the Duke Activity Status Index (DASI).  Therefore, based on ACC/AHA guidelines, patient would be at acceptable risk for the planned procedure without further cardiovascular testing.  Per office protocol, patient can hold warfarin for 5 days prior to procedure.     Patient will not need bridging with Lovenox  (enoxaparin ) around procedure Please resume warfarin as soon as possible postprocedure, at the discretion of the surgeon.   A copy of this note will be routed to requesting surgeon.  Time:   Today, I have spent 10 minutes with the patient with telehealth technology discussing medical history, symptoms, and management plan.     Riley Galik E Jasmond River, NP  11/22/2023, 10:58 AM

## 2023-11-22 NOTE — Telephone Encounter (Signed)
 Pending appt 11/28/2023

## 2023-11-23 NOTE — Telephone Encounter (Signed)
 Put in copy folder to address in Dec

## 2023-11-26 ENCOUNTER — Ambulatory Visit
Admission: RE | Admit: 2023-11-26 | Discharge: 2023-11-26 | Disposition: A | Discharge status: Home / Self Care | Source: Ambulatory Visit | Attending: Orthopaedic Surgery | Admitting: Orthopaedic Surgery

## 2023-11-26 DIAGNOSIS — M25511 Pain in right shoulder: Secondary | ICD-10-CM

## 2023-11-28 ENCOUNTER — Telehealth: Payer: Self-pay

## 2023-11-28 ENCOUNTER — Ambulatory Visit: Admitting: Primary Care

## 2023-11-28 ENCOUNTER — Encounter: Payer: Self-pay | Admitting: Primary Care

## 2023-11-28 ENCOUNTER — Telehealth: Payer: Self-pay | Admitting: Primary Care

## 2023-11-28 VITALS — BP 128/72 | HR 72 | Temp 98.5°F | Ht 72.0 in | Wt 238.4 lb

## 2023-11-28 DIAGNOSIS — I4891 Unspecified atrial fibrillation: Secondary | ICD-10-CM | POA: Diagnosis not present

## 2023-11-28 DIAGNOSIS — Z7901 Long term (current) use of anticoagulants: Secondary | ICD-10-CM | POA: Diagnosis not present

## 2023-11-28 DIAGNOSIS — G4733 Obstructive sleep apnea (adult) (pediatric): Secondary | ICD-10-CM | POA: Diagnosis not present

## 2023-11-28 DIAGNOSIS — Z01811 Encounter for preprocedural respiratory examination: Secondary | ICD-10-CM

## 2023-11-28 DIAGNOSIS — J329 Chronic sinusitis, unspecified: Secondary | ICD-10-CM | POA: Diagnosis not present

## 2023-11-28 NOTE — Patient Instructions (Addendum)
  VISIT SUMMARY: Today, you came in for a surgical risk assessment for your upcoming right shoulder surgery. We discussed your history of atrial fibrillation and severe sleep apnea, and reviewed your current treatments and symptoms. We also talked about your chronic sinus congestion and nasal obstruction, especially after your recent nasal surgery.  YOUR PLAN: -PREOPERATIVE PULMONARY RISK ASSESSMENT FOR RIGHT SHOULDER SURGERY: You are at an intermediate risk for lung complications due to your severe sleep apnea. The surgery should be done in a hospital because of your atrial fibrillation and the blood thinner you are taking. After surgery, you will need to be closely monitored, use a BiPAP machine, walk around to prevent blood clots and pneumonia, wear compression stockings, and use an incentive spirometer to help with your breathing.  -SEVERE OBSTRUCTIVE SLEEP APNEA NOT CONTROLLED ON CPAP: You have severe sleep apnea with 42 breathing interruptions per hour, mostly central apneas. Your current CPAP machine is not working well. We discussed trying a BiPAP machine again with adjusted settings for comfort. We also talked about a potential surgical option called Inspire. You will be referred to Doctor Alva for further evaluation and management.  -ATRIAL FIBRILLATION ON ANTICOAGULATION: Atrial fibrillation is an irregular and often rapid heart rate. You are currently taking Coumadin , a blood thinner, and have not had significant symptoms recently. Your cardiologist has noted improvement since your retirement.  -CHRONIC SINUS CONGESTION AND NASAL OBSTRUCTION, POST-SURGICAL: You have had improvement in your nasal breathing since your surgery, but still experience occasional sinus congestion and irritation, especially with air conditioning. You use AYR gel to help with these symptoms.  INSTRUCTIONS: Please follow up with Doctor Jude for further evaluation and management of your sleep apnea. Make sure to inform  your surgeon about your severe sleep apnea and the need for careful monitoring after anesthesia. Use the BiPAP machine as instructed and follow the postoperative care plan to prevent complications. Continue using AYR gel for sinus congestion relief.  Orders: Auto BIPAP   Follow-up 6-12 weeks with Dr. Jude in [place] for BIPAP compliance

## 2023-11-28 NOTE — Telephone Encounter (Deleted)
 Paitent of yours with severe OSA, previously on auto CPAP with residula AHI >40/hour. He had CPAP titrtion study in April with central emergent apneas, OSA was corrected with pressure 17/

## 2023-11-28 NOTE — Progress Notes (Signed)
 @Patient  ID: Riley [name], male    DOB: Nov 08, 1945, 78 y.o.   MRN: [id number]  No chief complaint on file.   Referring provider: Bertell Satterfield, MD  HPI: 78 year old male.  Past medical history significant for A-fib and OSA.  Patient of Dr. Jude in office on 11/21/2021.  Previous [facility] pulmonary encounter: 12/08/2022 Presents today for annual office visit for OSA on.  He feels he is doing alright. Dealing with sinus issues, having sneezing symptoms and sinus headache. He is taking xyzal OTC daily which has helped. He feels is nasal passages are very try. He has been using saline nasal spray several times a day. He reports getting solid sleep the last three nights until  7am. He works third shift. He goes to bed around 12-12:30am. He starts his day around 8-8:30am. The uses full face mask, he does experience airleaks. He got a new mask 2 years ago. He usually wakes up to use the restroom around 2-2:30am and will at times have a hard time fallings back to sleep. He takes lasix  in the morning.   Airview compliance 11/08/2022 - 12/07/2022 Usage days 27 days (97%) 4 hours Average usage 5 hours 28 minutes Pressure 12-15/2 oh (14.3 cm H2O-95%) Air leaks 42.8 L/min (95%) Apnea index-central 36.8/obstructive 2.1/unknown 3.7 AHI 43.5 an hour   04/13/2023 Discussed the use of AI scribe software for clinical note transcription with the patient, who gave verbal consent to proceed.  Patient contacted today for virtual visit/ OSA on CPAP.  Patient had sleep study in 2014, RDI 37 events per hour.  During patient's last visit in October his pressure settings were adjusted due to significant amount of breakthrough apneas.  Patient had not changed his mask in over 2 years.  Current pressure setting 10 to 18 cm H2O.  Patient has having moderate amount of air leaks.  Continues to have significant amount of breakthrough apneas, primarily central apneas.  He obtained a new mask, which has improved air leaks,  although it can become loose if it shifts during the night. No waking up gasping for air, choking, or snoring, but he mentions needing to make bathroom trips around 5 or 6 AM. He feels that the CPAP helps him breathe better, especially with his sinus issues, and generally reports sleeping well, although he occasionally experiences nasal passage shrinkage that affects his breathing.  Airview download 03/13/2023 - 04/11/2023 Usage days 29/30 days (97%); 26 days (87%) greater than 4 hours Average usage 5 hours 5 minutes Pressure 10 to 18 cm H2O (15.4 cm H2O-95%) Air leaks 29.4 L/min (95%) AHI 48.7  11/28/2023- Interim hx Discussed the use of AI scribe software for clinical note transcription with the patient, who gave verbal consent to proceed. History of Present Illness Riley [name] is a 78 year old male with atrial fibrillation and severe sleep apnea who presents for surgical risk assessment for upcoming right shoulder surgery.  He has been experiencing right shoulder issues for approximately 14 to 15 years, initially related to his work before retirement. A recent CT scan was performed on November 26, 2023, to further evaluate the shoulder condition, and he is awaiting the results to determine the surgical plan. He is also scheduled to receive a knee injection on Friday.  He has a history of atrial fibrillation and is currently on Coumadin . His atrial fibrillation is less symptomatic than in the past, with only faint episodes that he cannot detect.  He has severe sleep apnea and currently uses  a CPAP machine with a pressure setting of 10-18cm h20. However, he experiences significant apneic events, approximately 42 per hour, primarily central apneas. He previously tried a BiPAP machine during a sleep study in April, which indicated a need for BiPAP, but he found it uncomfortable and refused use. He has been using the CPAP inconsistently due to sinus irritation and discomfort, leading to significant  apneic events.  He underwent nasal surgery, which improved his breathing significantly, as he previously experienced nasal collapse and had to breathe through his mouth. Post-surgery, he reports better nasal airflow but still experiences sinus irritation, especially with air conditioning exposure, leading to congestion and sinus drainage. He uses AYR gel to alleviate nasal swelling and congestion.  No current trouble breathing or chest pain but reports occasional sinus drainage and congestion. No underlying history of COPD or asthma.  Significant tests/ events reviewed 2014 PSG >>TST 128 mins - only 2-3 hrs of sleep  -RDI 37/h Started on autoCPAP 5-15  >> changed to 11 cm      No Known Allergies  Immunization History  Administered Date(s) Administered   INFLUENZA, HIGH DOSE SEASONAL PF 12/03/2016, 01/02/2018   Influenza Whole 12/06/2011   Influenza-Unspecified 01/15/2014   Moderna Sars-Covid-2 Vaccination 05/06/2019, 06/03/2019   Pneumococcal Polysaccharide-23 05/01/2012    Past Medical History:  Diagnosis Date   Chronic anticoagulation    Chronic back pain    Diabetes mellitus, type 2 (HCC)    no insulin    Edema    GERD (gastroesophageal reflux disease)    Hyperlipidemia    Hypertension    normal coronary angiography and normal EF in 12/98   Nodule of left lung    Granuloma in left upper lobe   Paroxysmal atrial fibrillation (HCC)    12/2005; sinus bradycardia secondary to medications   Sleep apnea     Tobacco History: Social History   Tobacco Use  Smoking Status Never  Smokeless Tobacco Never   Counseling given: Not Answered   Outpatient Medications Prior to Visit  Medication Sig Dispense Refill   acetaminophen  (TYLENOL ) 500 MG tablet Take 500 mg by mouth every 6 (six) hours as needed (for pain.).     calcium  carbonate (TUMS - DOSED IN MG ELEMENTAL CALCIUM ) 500 MG chewable tablet Chew 1 tablet by mouth as needed for indigestion or heartburn.      chlorthalidone  (HYGROTON ) 25 MG tablet Take 0.5 tablets (12.5 mg total) by mouth every other day. 45 tablet 3   diltiazem  (CARDIZEM ) 30 MG tablet Take 1 tablet (30 mg total) by mouth daily as needed (Palpations). 30 tablet 11   doxazosin  (CARDURA ) 4 MG tablet TAKE THREE TABLETS BY MOUTH EVERY EVENING 270 tablet 3   furosemide  (LASIX ) 20 MG tablet Take 1 tablet (20 mg total) by mouth daily. (Patient taking differently: Take 20 mg by mouth daily as needed for fluid or edema.) 90 tablet 3   glimepiride (AMARYL) 1 MG tablet Take 1 mg by mouth daily.     hydrALAZINE  (APRESOLINE ) 100 MG tablet Take 1 tablet (100 mg total) by mouth 3 (three) times daily. 90 tablet 1   Hydrocortisone (PREPARATION H EX) Place 1 application rectally daily as needed (irritation/discomfort).      linaclotide  (LINZESS ) 290 MCG CAPS capsule TAKE ONE CAPSULE BY MOUTH EVERY MORNING 30 capsule 0   lisinopril  (ZESTRIL ) 20 MG tablet TAKE (1) TABLET BY MOUTH ONCE DAILY. 90 tablet 3   Magnesium  Oxide 200 MG TABS Take 1 tablet (200 mg total) by mouth  daily. 30 each 11   metFORMIN  (GLUCOPHAGE ) 1000 MG tablet Take 1,000 mg by mouth 2 (two) times daily.     metoprolol  tartrate (LOPRESSOR ) 50 MG tablet Take 1 tablet (50 mg total) by mouth 2 (two) times daily. 180 tablet 2   Multiple Vitamin (MULTIVITAMIN) tablet Take 1 tablet by mouth daily.     pantoprazole  (PROTONIX ) 40 MG tablet TAKE ONE TABLET BY MOUTH ONCE DAILY 30 MINUTES BEFORE BREAKFAST 90 tablet 3   Phenyleph-Chlorphen-Hydrocod (CHLORPHEN HD PO) Take 1 tablet by mouth daily as needed (Allergies).     pravastatin  (PRAVACHOL ) 20 MG tablet Take 1 tablet (20 mg total) by mouth every evening. 90 tablet 3   warfarin (COUMADIN ) 5 MG tablet Take 5 mg by mouth daily.     No facility-administered medications prior to visit.   Review of Systems  Review of Systems  Constitutional: Negative.   Respiratory: Negative.    Cardiovascular: Negative.   Musculoskeletal:  Positive for  arthralgias.   Physical Exam  There were no vitals taken for this visit. Physical Exam Constitutional:      Appearance: Normal appearance. He is well-developed.  HENT:     Head: Normocephalic and atraumatic.     Mouth/Throat:     Mouth: Mucous membranes are moist.     Pharynx: Oropharynx is clear.  Cardiovascular:     Rate and Rhythm: Normal rate and regular rhythm.     Heart sounds: Normal heart sounds.  Pulmonary:     Effort: Pulmonary effort is normal. No respiratory distress.     Breath sounds: Normal breath sounds. No wheezing or rhonchi.  Musculoskeletal:        General: Normal range of motion.     Cervical back: Normal range of motion and neck supple.  Skin:    General: Skin is warm and dry.     Findings: No erythema or rash.  Neurological:     General: No focal deficit present.     Mental Status: He is alert and oriented to person, place, and time. Mental status is at baseline.  Psychiatric:        Mood and Affect: Mood normal.        Behavior: Behavior normal.        Thought Content: Thought content normal.        Judgment: Judgment normal.      Lab Results:  CBC    Component Value Date/Time   WBC 6.7 08/17/2023 1400   RBC 4.97 08/17/2023 1400   HGB 14.9 08/17/2023 1400   HCT 45.5 08/17/2023 1400   PLT 200 08/17/2023 1400   MCV 91.5 08/17/2023 1400   MCH 30.0 08/17/2023 1400   MCHC 32.7 08/17/2023 1400   RDW 14.6 08/17/2023 1400   LYMPHSABS 1,440 11/01/2016 0849   MONOABS 720 11/01/2016 0849   EOSABS 60 11/01/2016 0849   BASOSABS 60 11/01/2016 0849    BMET    Component Value Date/Time   NA 143 08/17/2023 1400   K 3.9 08/17/2023 1400   CL 104 08/17/2023 1400   CO2 29 08/17/2023 1400   GLUCOSE 111 (H) 08/17/2023 1400   BUN 18 08/17/2023 1400   CREATININE 1.17 08/17/2023 1400   CREATININE 1.17 11/01/2016 0849   CALCIUM  9.5 08/17/2023 1400   GFRNONAA >60 08/17/2023 1400   GFRAA >60 04/24/2017 1158    BNP No results found for:  BNP  ProBNP    Component Value Date/Time   PROBNP 262.9 (H) 04/29/2012 1022  Imaging: No results found.   Assessment & Plan:   1. OSA (obstructive sleep apnea) (Primary)  Assessment and Plan Assessment & Plan Preoperative pulmonary risk assessment for right shoulder surgery Intermediate risk for pulmonary complications due to severe sleep apnea. Surgery should be performed in a hospital setting due to OSA and atrial fibrillation. Postoperative monitoring in the recovery unit is necessary. - Recommend surgery in a hospital setting - Inform surgeon of severe sleep apnea and need for careful monitoring post-anesthesia - Advise postoperative use of BiPAP - Encourage ambulation post-surgery to prevent blood clots and pneumonia - Instruct on wearing compression stockings - Use incentive spirometer post-surgery  Severe obstructive sleep apnea not controlled on CPAP Patient had a CPAP titration study in April 2025 which revealed severe OSA with emergent central apneas. An order was placed for auto BIPAP with Max IPAP 18, PS 4, EPAP min 10 but refused BIPAP due to discomfort during sleep study. He is currently using CPAP 10-18cm h20 with residual AHI 42/hour.  We had a long discussion with patient today regarding need for BIPAP, he initially refused BIPAP but eventually agreeing to trying with adjusted settings.  - Order BiPAP with auto settings and humidification - Adjust BiPAP pressure settings for comfort - Refer to Doctor Jude for further evaluation and management - Discuss potential surgical option (Inspire) with Dr. Jude  Atrial fibrillation on anticoagulation - Continue Coumadin , metoprolol  tartrate and diltiazem  as prescribed  Chronic sinus congestion and nasal obstruction, post-surgical Post-surgical improvement in nasal breathing. Occasional sinus congestion and irritation, especially with air conditioning exposure. Uses AYR gel for relief.  1) RISK FOR PROLONGED  MECHANICAL VENTILAION - > 48h  1A) Arozullah - Prolonged mech ventilation risk Arozullah Postperative Pulmonary Risk Score - for mech ventilation dependence >48h USAA, Ann Surg 2000, major non-cardiac surgery) Comment Score  Type of surgery - abd ao aneurysm (27), thoracic (21), neurosurgery / upper abdominal / vascular (21), neck (11) Shoulder surgery 6  Emergency Surgery - (11)  0  ALbumin < 3 or poor nutritional state - (9)  0  BUN > 30 -  (8)  0  Partial or completely dependent functional status - (7)  0  COPD -  (6)  0  Age - 60 to 69 (4), > 70  (6)  6  TOTAL  12  Risk Stratifcation scores  - < 10 (0.5%), 11-19 (1.8%), 20-27 (4.2%), 28-40 (10.1%), >40 (26.6%)  1.8%    1B) GUPTA - Prolonged Mech Vent Risk Score source Risk  Guptal post op prolonged mech ventilation > 48h or reintubation < 30 days - ACS 2007-2008 dataset - [email or web address] 0.3 % Risk of mechanical ventilation for >48 hrs after surgery, or unplanned intubation <=30 days of surgery    2) RISK FOR POST OP PNEUMONIA Score source Risk  Charlanne - Post Op Pnemounia risk  [email or web address] 0.4 % Risk of postoperative pneumonia    R3) ISK FOR ANY POST-OP PULMONARY COMPLICATION Score source Risk  CANET/ARISCAT Score - risk for ANY/ALl pulmonary complications - > risk of in-hospital post-op pulmonary complications (composite including respiratory failure, respiratory infection, pleural effusion, atelectasis, pneumothorax, bronchospasm, aspiration pneumonitis) [employer] - based on age, anemia, pulse ox, resp infection prior 30d, incision site, duration of surgery, and emergency v elective surgery Low risk 1.6% risk of in-hospital post-op pulmonary complications (composite including respiratory failure, respiratory infection, pleural effusion, atelectasis,  pneumothorax, bronchospasm, aspiration pneumonitis)    Almarie LELON Ferrari, NP 11/28/2023

## 2023-11-28 NOTE — Telephone Encounter (Signed)
 Error

## 2023-11-28 NOTE — Telephone Encounter (Signed)
 I called [facility] on behalf of Almarie Bologna, NP by secure chat. Pt was suppose to be on a Bipap and not a CPAP. Order for BIPAP was placed in April but at that time, pt's wife informed them that he did not prefer it at all so order was never processed. Almarie Ferrari, NP informed.

## 2023-11-29 ENCOUNTER — Encounter: Payer: Self-pay | Admitting: Nurse Practitioner

## 2023-11-29 ENCOUNTER — Ambulatory Visit: Admitting: Nurse Practitioner

## 2023-11-29 VITALS — BP 126/77 | HR 69 | Temp 98.1°F | Ht 72.0 in | Wt 236.6 lb

## 2023-11-29 DIAGNOSIS — R351 Nocturia: Secondary | ICD-10-CM

## 2023-11-29 DIAGNOSIS — E1121 Type 2 diabetes mellitus with diabetic nephropathy: Secondary | ICD-10-CM

## 2023-11-29 DIAGNOSIS — N1831 Chronic kidney disease, stage 3a: Secondary | ICD-10-CM | POA: Diagnosis not present

## 2023-11-29 DIAGNOSIS — Z0001 Encounter for general adult medical examination with abnormal findings: Secondary | ICD-10-CM | POA: Insufficient documentation

## 2023-11-29 DIAGNOSIS — I501 Left ventricular failure: Secondary | ICD-10-CM | POA: Diagnosis not present

## 2023-11-29 DIAGNOSIS — Z7984 Long term (current) use of oral hypoglycemic drugs: Secondary | ICD-10-CM

## 2023-11-29 LAB — LIPID PANEL

## 2023-11-29 LAB — BAYER DCA HB A1C WAIVED: HB A1C (BAYER DCA - WAIVED): 6.2 % — ABNORMAL HIGH (ref 4.8–5.6)

## 2023-11-29 NOTE — Progress Notes (Signed)
 Subjective:  Patient ID: Riley [name], male    DOB: [birth year], 78 y.o.   MRN: [id number]  Patient Care Team: Deitra Morton Hummer, Nena, NP as PCP - General (Nurse Practitioner) Stacia Diannah SQUIBB, MD as PCP - Cardiology (Cardiology) Margrette Taft BRAVO, MD (Orthopedic Surgery) Shaaron Lamar HERO, MD as Consulting Physician (Gastroenterology)   Chief Complaint:  Establish Care   HPI: Riley [name] is a 78 y.o. male presenting on 11/29/2023 for Establish Care   Discussed the use of AI scribe software for clinical note transcription with the patient, who gave verbal consent to proceed.  History of Present Illness Riley [name] is a 79 year old male who presents for surgical clearance. [name] is present during the encounter.  In August, he experienced an issue with his left knee where it 'went out'. He received an X-ray and a series of three injections, including gel injections, administered every Friday after work. His knee is doing a little better.  He has been experiencing trouble with his right shoulder, which he injured at work approximately 15-20 years ago. He describes a past incident where he felt something 'pop' while throwing a trash bag, leading to soreness the next day. He has been dealing with this issue since then, initially thinking it was a pinched nerve. An X-ray revealed 'bone on bone' contact, and a CT scan was performed on September 11th. He has difficulty extending his arm fully and experiences significant pain, especially after an active day. He underwent six weeks of therapy but was dismissed due to persistent pain.  He underwent nose surgery on June 20th due to nasal obstruction and sinus issues, which caused headaches and pressure. He reports improvement but still experiences occasional dull sensations and fatigue in his legs. He has not had any recent falls, with the last incident over a year ago where he tripped but did not fall.  He has a history of diabetes,  managed with metformin  and glimepiride, and his A1c is usually around 6. He has not been on insulin . He has not seen an eye doctor since before COVID and acknowledges having cataracts.  He is currently taking warfarin 5 mg daily, pravastatin  20 mg daily, chlorphen as needed, and pantoprazole  40 mg daily, although he takes it sporadically. He takes metoprolol  50 mg twice daily, metformin  1000 mg twice daily, magnesium  supplements at night, lisinopril  20 mg daily, Linzess  290 mcg twice a week, hydralazine  100 mg three times a day, glimepiride 1 mg daily, Lasix  as needed, doxazosin  4 mg three times daily, and Tums as needed. He has not been taking diltiazem  recently.    Lab Results  Component Value Date   HGBA1C 6.0 (H) 11/01/2016   HGBA1C 6.2 (H) 04/29/2012   Lab Results  Component Value Date   LDLCALC 68 11/01/2016   CREATININE 1.17 08/17/2023     Relevant past medical, surgical, family, and social history reviewed and updated as indicated.  Allergies and medications reviewed and updated. Data reviewed: Chart in Epic.   Past Medical History:  Diagnosis Date   Chronic anticoagulation    Chronic back pain    Diabetes mellitus, type 2 (HCC)    no insulin    Edema    GERD (gastroesophageal reflux disease)    Hyperlipidemia    Hypertension    normal coronary angiography and normal EF in 12/98   Nodule of left lung    Granuloma in left upper lobe   Paroxysmal atrial fibrillation (HCC)  12/2005; sinus bradycardia secondary to medications   Sleep apnea     Past Surgical History:  Procedure Laterality Date   APPENDECTOMY  1998   COLONOSCOPY N/A 11/26/2012   MFM:Hmjiz 2 hemorrhoids. Pancolonic diverticulosis. Colonic polyps removed as described above. One hemostasis clip applied. TUBULAR ADENOMA. Surveillance Sept 2019.    COLONOSCOPY N/A 02/06/2018   two 3-5 mm cecal polyps, pancolonic diverticulosis. tubular adenoma. no surveillance due to age   INSERTION OF MESH N/A 01/27/2013    Procedure: INSERTION OF MESH;  Surgeon: Oneil DELENA Budge, MD;  Location: [facility];  Service: General;  Laterality: N/A;   NASAL SEPTOPLASTY W/ TURBINOPLASTY Bilateral 08/24/2023   Procedure: SEPTOPLASTY, NOSE, WITH NASAL TURBINATE REDUCTION;  Surgeon: Karis Clunes, MD;  Location: [facility];  Service: ENT;  Laterality: Bilateral;   POLYPECTOMY  02/06/2018   Procedure: POLYPECTOMY;  Surgeon: Shaaron Lamar HERO, MD;  Location: [facility];  Service: Endoscopy;;  (colon)   TONSILLECTOMY     as a child   UMBILICAL HERNIA REPAIR N/A 01/27/2013   Procedure: HERNIA REPAIR UMBILICAL ADULT;  Surgeon: Oneil DELENA Budge, MD;  Location: [facility];  Service: General;  Laterality: N/A;    Social History   Socioeconomic History   Marital status: Married    Spouse name: Not on file   Number of children: Not on file   Years of education: Not on file   Highest education level: Not on file  Occupational History   Occupation: Retired    Associate Professor: [employer]    Comment: Arboriculturist  Tobacco Use   Smoking status: Never   Smokeless tobacco: Never  Vaping Use   Vaping status: Never Used  Substance and Sexual Activity   Alcohol use: No    Alcohol/week: 0.0 standard drinks of alcohol   Drug use: Never   Sexual activity: Yes    Partners: Female    Comment: married  Other Topics Concern   Not on file  Social History Narrative   Not on file   Social Drivers of Health   Financial Resource Strain: Not on file  Food Insecurity: No Food Insecurity (11/29/2023)   Hunger Vital Sign    Worried About Running Out of Food in the Last Year: Never true    Ran Out of Food in the Last Year: Never true  Transportation Needs: Not on file  Physical Activity: Not on file  Stress: Not on file  Social Connections: Not on file  Intimate Partner Violence: Not At Risk (11/29/2023)   Humiliation, Afraid, Rape, and Kick questionnaire    Fear of Current or Ex-Partner: No    Emotionally Abused: No    Physically Abused: No     Sexually Abused: No    Outpatient Encounter Medications as of 11/29/2023  Medication Sig   acetaminophen  (TYLENOL ) 500 MG tablet Take 500 mg by mouth every 6 (six) hours as needed (for pain.).   calcium  carbonate (TUMS - DOSED IN MG ELEMENTAL CALCIUM ) 500 MG chewable tablet Chew 1 tablet by mouth as needed for indigestion or heartburn.   chlorthalidone  (HYGROTON ) 25 MG tablet Take 0.5 tablets (12.5 mg total) by mouth every other day.   diltiazem  (CARDIZEM ) 30 MG tablet Take 1 tablet (30 mg total) by mouth daily as needed (Palpations).   doxazosin  (CARDURA ) 4 MG tablet TAKE THREE TABLETS BY MOUTH EVERY EVENING   furosemide  (LASIX ) 20 MG tablet Take 1 tablet (20 mg total) by mouth daily. (Patient taking differently: Take 20 mg by mouth  daily as needed for fluid or edema.)   glimepiride (AMARYL) 1 MG tablet Take 1 mg by mouth daily.   hydrALAZINE  (APRESOLINE ) 100 MG tablet Take 1 tablet (100 mg total) by mouth 3 (three) times daily.   Hydrocortisone (PREPARATION H EX) Place 1 application rectally daily as needed (irritation/discomfort).    linaclotide  (LINZESS ) 290 MCG CAPS capsule TAKE ONE CAPSULE BY MOUTH EVERY MORNING   lisinopril  (ZESTRIL ) 20 MG tablet TAKE (1) TABLET BY MOUTH ONCE DAILY.   Magnesium  Oxide 200 MG TABS Take 1 tablet (200 mg total) by mouth daily.   metFORMIN  (GLUCOPHAGE ) 1000 MG tablet Take 1,000 mg by mouth 2 (two) times daily.   metoprolol  tartrate (LOPRESSOR ) 50 MG tablet Take 1 tablet (50 mg total) by mouth 2 (two) times daily.   Multiple Vitamin (MULTIVITAMIN) tablet Take 1 tablet by mouth daily.   Phenyleph-Chlorphen-Hydrocod (CHLORPHEN HD PO) Take 1 tablet by mouth daily as needed (Allergies).   pravastatin  (PRAVACHOL ) 20 MG tablet Take 1 tablet (20 mg total) by mouth every evening.   warfarin (COUMADIN ) 5 MG tablet Take 5 mg by mouth daily.   [DISCONTINUED] pantoprazole  (PROTONIX ) 40 MG tablet TAKE ONE TABLET BY MOUTH ONCE DAILY 30 MINUTES BEFORE BREAKFAST   No  facility-administered encounter medications on file as of 11/29/2023.    No Known Allergies  Pertinent ROS per HPI, otherwise unremarkable      Objective:  BP 126/77   Pulse 69   Temp 98.1 F (36.7 C) (Temporal)   Ht 6' (1.829 m)   Wt 236 lb 9.6 oz (107.3 kg)   SpO2 97%   BMI 32.09 kg/m    Wt Readings from Last 3 Encounters:  11/29/23 236 lb 9.6 oz (107.3 kg)  11/28/23 238 lb 6.4 oz (108.1 kg)  08/24/23 225 lb (102.1 kg)    Physical Exam Vitals and nursing note reviewed.  Constitutional:      Appearance: He is obese.  HENT:     Head: Normocephalic and atraumatic.     Right Ear: Tympanic membrane, ear canal and external ear normal. There is no impacted cerumen.     Left Ear: Tympanic membrane, ear canal and external ear normal. There is no impacted cerumen.     Nose: Nose normal.     Mouth/Throat:     Mouth: Mucous membranes are moist.  Eyes:     General: No scleral icterus.    Extraocular Movements: Extraocular movements intact.     Conjunctiva/sclera: Conjunctivae normal.     Pupils: Pupils are equal, round, and reactive to light.  Cardiovascular:     Heart sounds: Normal heart sounds.  Pulmonary:     Effort: Pulmonary effort is normal.     Breath sounds: Normal breath sounds.  Abdominal:     General: Bowel sounds are normal.     Palpations: Abdomen is soft.  Musculoskeletal:     Right shoulder: Tenderness present. Decreased range of motion.     Left shoulder: Normal.     Right lower leg: No edema.     Left lower leg: No edema.  Skin:    General: Skin is warm and dry.     Capillary Refill: Capillary refill takes less than 2 seconds.     Findings: No rash.  Neurological:     Mental Status: He is alert and oriented to person, place, and time.  Psychiatric:        Mood and Affect: Mood normal.        Behavior:  Behavior normal.        Thought Content: Thought content normal.        Judgment: Judgment normal.    Physical Exam      Results for orders  placed or performed in visit on 11/13/23  POCT INR   Collection Time: 11/13/23 11:49 AM  Result Value Ref Range   INR 2.5 2.0 - 3.0   POC INR         Pertinent labs & imaging results that were available during my care of the patient were reviewed by me and considered in my medical decision making.  Assessment & Plan:  Zyrus was seen today for establish care.  Diagnoses and all orders for this visit:  Stage 3a chronic kidney disease (HCC) -     CBC with Differential/Platelet  Left heart failure (HCC)  Morbid obesity (HCC) -     Lipid panel -     Thyroid  Panel With TSH  Type 2 diabetes mellitus with diabetic nephropathy, without long-term current use of insulin  (HCC) -     Microalbumin / creatinine urine ratio -     Bayer DCA Hb A1c Waived  Encounter for general adult medical examination with abnormal findings -     Bayer DCA Hb A1c Waived -     CBC with Differential/Platelet -     CMP14+EGFR -     Lipid panel -     Thyroid  Panel With TSH  Nocturia -     PSA, total and free     Assessment and Plan Assessment & Plan Right Shoulder Osteoarthritis Chronic pain and limited range of motion due to osteoarthritis. Cleared for surgery by cardiologist and pulmonologist. - Obtain surgical clearance for right shoulder surgery. - Coordinate with surgical team to schedule surgery once clearance is obtained.  Left Knee Osteoarthritis Slight improvement after first gel injection. - Continue with the series of gel injections for the left knee.  Atrial Fibrillation Asymptomatic, managed with metoprolol  and warfarin. No recent episodes. - Discuss with cardiologist regarding management of atrial fibrillation and the need for diltiazem .  Type 2 Diabetes Mellitus Good control with A1c around 6. - Continue metformin  1000 mg twice daily. - Continue glimepiride 1 mg daily. - Schedule follow-up every three months for diabetes management. - Microalbumin ordered, diabetic foot exam  completed Hypertension Managed with multiple antihypertensives. - Continue lisinopril  20 mg daily. - Continue metoprolol  50 mg twice daily. - Continue hydralazine  100 mg three times daily. - Continue doxazosin  4 mg daily. - Continue chlorthalidone  as prescribed.  Chronic Kidney Disease - Consult with nephrologist regarding kidney function and medication management.  Gastroesophageal Reflux Disease (GERD) Rare episodes, usually triggered by spicy foods. - Discontinue pantoprazole  as it is rarely needed. - Continue TUMS PRN  Constipation Managed with Linzess  as needed. - Continue Linzess  290 mcg as needed.  General Health Maintenance Due for eye exam and flu vaccine. Considering COVID-19 vaccine post-surgery. - Schedule an eye exam post-surgery. - postpone Administering flu vaccine. - Discuss COVID-19 vaccination post-surgery.  Follow-up Requires follow-up for surgical clearance and routine diabetes management. - Perform lab work to clear for surgery, including kidney function, thyroid  function, lipid panel, blood counts, and PSA. - Schedule follow-up in three months for diabetes management.  Labs: CBC, CMP, lipid, TSH, PSA result pending   Health maintenance: Not up-to-date need Tdap, flu and COVID Future: Need Medicare annual wellness  Continue all other maintenance medications.  Follow up plan: Return in about 3 months (around 02/28/2024)  for Chronic Diseases Management.   Continue healthy lifestyle choices, including diet (rich in fruits, vegetables, and lean proteins, and low in salt and simple carbohydrates) and exercise (at least 30 minutes of moderate physical activity daily).  Educational handout given for  Health Maintenance After Age 42 After age 65, you are at a higher risk for certain long-term diseases and infections as well as injuries from falls. Falls are a major cause of broken bones and head injuries in people who are older than age 80. Getting regular  preventive care can help to keep you healthy and well. Preventive care includes getting regular testing and making lifestyle changes as recommended by your health care provider. Talk with your health care provider about: Which screenings and tests you should have. A screening is a test that checks for a disease when you have no symptoms. A diet and exercise plan that is right for you. What should I know about screenings and tests to prevent falls? Screening and testing are the best ways to find a health problem early. Early diagnosis and treatment give you the best chance of managing medical conditions that are common after age 60. Certain conditions and lifestyle choices may make you more likely to have a fall. Your health care provider may recommend: Regular vision checks. Poor vision and conditions such as cataracts can make you more likely to have a fall. If you wear glasses, make sure to get your prescription updated if your vision changes. Medicine review. Work with your health care provider to regularly review all of the medicines you are taking, including over-the-counter medicines. Ask your health care provider about any side effects that may make you more likely to have a fall. Tell your health care provider if any medicines that you take make you feel dizzy or sleepy. Strength and balance checks. Your health care provider may recommend certain tests to check your strength and balance while standing, walking, or changing positions. Foot health exam. Foot pain and numbness, as well as not wearing proper footwear, can make you more likely to have a fall. Screenings, including: Osteoporosis screening. Osteoporosis is a condition that causes the bones to get weaker and break more easily. Blood pressure screening. Blood pressure changes and medicines to control blood pressure can make you feel dizzy. Depression screening. You may be more likely to have a fall if you have a fear of falling, feel  depressed, or feel unable to do activities that you used to do. Alcohol use screening. Using too much alcohol can affect your balance and may make you more likely to have a fall. Follow these instructions at home: Lifestyle Do not drink alcohol if: Your health care provider tells you not to drink. If you drink alcohol: Limit how much you have to: 0-1 drink a day for women. 0-2 drinks a day for men. Know how much alcohol is in your drink. In the [place] one drink equals one 12 oz bottle of beer (355 mL), one 5 oz glass of wine (148 mL), or one 1 oz glass of hard liquor (44 mL). Do not use any products that contain nicotine or tobacco. These products include cigarettes, chewing tobacco, and vaping devices, such as e-cigarettes. If you need help quitting, ask your health care provider. Activity  Follow a regular exercise program to stay fit. This will help you maintain your balance. Ask your health care provider what types of exercise are appropriate for you. If you need a cane or walker, use it  as recommended by your health care provider. Wear supportive shoes that have nonskid soles. Safety  Remove any tripping hazards, such as rugs, cords, and clutter. Install safety equipment such as grab bars in bathrooms and safety rails on stairs. Keep rooms and walkways well-lit. General instructions Talk with your health care provider about your risks for falling. Tell your health care provider if: You fall. Be sure to tell your health care provider about all falls, even ones that seem minor. You feel dizzy, tiredness (fatigue), or off-balance. Take over-the-counter and prescription medicines only as told by your health care provider. These include supplements. Eat a healthy diet and maintain a healthy weight. A healthy diet includes low-fat dairy products, low-fat (lean) meats, and fiber from whole grains, beans, and lots of fruits and vegetables. Stay current with your vaccines. Schedule regular  health, dental, and eye exams. Summary Having a healthy lifestyle and getting preventive care can help to protect your health and wellness after age 22. Screening and testing are the best way to find a health problem early and help you avoid having a fall. Early diagnosis and treatment give you the best chance for managing medical conditions that are more common for people who are older than age 24. Falls are a major cause of broken bones and head injuries in people who are older than age 47. Take precautions to prevent a fall at home. Work with your health care provider to learn what changes you can make to improve your health and wellness and to prevent falls. This information is not intended to replace advice given to you by your health care provider. Make sure you discuss any questions you have with your health care provider. Document Revised: 07/12/2020 Document Reviewed: 07/12/2020 Elsevier Patient Education  2024 [employer] Medical Screening Exam A medical screening exam (MSE) helps to determine whether you need immediate medical treatment relating to any number of symptoms you are having. This type of exam may be done in an emergency department, an urgent care setting, or your health care provider's office. Depending on your symptoms and severity, you may need additional tests or medical therapy. It is important to note that an MSE does not necessarily mean that you will need or receive further medical testing or interventions if your symptoms are not deemed to be medically urgent (emergent). Tell a health care provider about: Any allergies you have. All medicines you are taking, including vitamins, herbs, eye drops, creams, and over-the-counter medicines. Any problems you or family members have had with anesthetic medicines. Any bleeding problems you have. Any surgeries you have had. Any medical conditions you have. Whether you are pregnant or may be pregnant. What happens during  the test? During the exam, a health care provider does a short, often focused, physical exam and asks about your medical history to assess: Your current symptoms. Your overall health. Your need for possible further medical intervention. What can I expect after the test? If you have a regular health care provider, make an appointment for a follow-up visit with him or her. If you do not have a regular health care provider, ask about resources in your community. Your medical screening exam may determine that: You do not need emergency treatment at this time. You need treatment right away. You need to be transferred to another medical center. This may happen if you need an emergent specialist or consultant that is not available at the medical center you are at. You need to have more tests.  A medical specialist may be consulted if needed. Get help right away if: Your condition gets worse. You develop new or troubling symptoms before you see your health care provider. These symptoms may represent a serious problem that is an emergency. Do not wait to see if the symptoms will go away. Get medical help right away. Call your local emergency services (911 in the [place] Do not drive yourself to the hospital. Summary A medical screening exam helps to determine whether you need medical treatment right away. This type of exam may be done in an emergency department, an urgent care setting, or your health care provider's office. During the exam, a health care provider does a short physical exam and asks about your current symptoms and overall health. Depending on the exam, more tests or therapies may be ordered. However, an MSE does not necessarily mean that you will have further medical testing if your symptoms are not deemed to be urgent. If you need further care that is not offered at your current medical center, you may need to be transferred to another facility. This information is not intended to replace  advice given to you by your health care provider. Make sure you discuss any questions you have with your health care provider. Document Revised: 11/03/2020 Document Reviewed: 07/01/2020 Elsevier Patient Education  2024 [employer] Diabetic Nephropathy  Diabetic nephropathy is kidney disease that is caused by diabetes (diabetes mellitus). Kidneys are organs that filter and clean blood and get rid of body waste products and extra fluid. Diabetes can cause gradual kidney damage over many years. Diabetic nephropathy that keeps getting worse can lead to kidney failure. What are the causes? This condition is caused by diabetes that is not well controlled with treatment. Having high blood sugar (glucose) for a long time because of diabetes can damage blood vessels in the kidneys and cause them to thicken and become scarred. Those changes prevent the kidneys from working like they should. What increases the risk? This condition is more likely to develop in people with diabetes who: Have had diabetes for many years. Have high blood pressure. Have high blood glucose levels over a long period of time. Have a family history of kidney disease. Have a history of tobacco use. Have certain genes that are passed from parent to child (inherited). What are the signs or symptoms? This condition may not cause symptoms at first. If you do have symptoms, they may include: Swelling of your hands, feet, or ankles. Weakness. Poor appetite. Nausea. Confusion. Tiredness (fatigue). Trouble sleeping. Dry, itchy skin. If your condition leads to kidney failure, symptoms may include: Vomiting. Shortness of breath. Jerky movements that you cannot control (seizure). Coma. How is this diagnosed? It is important to diagnose this condition before symptoms develop. You may be screened for this condition at a routine health care visit. Screening tests may include: Urine tests. These may be done every year. Urine  collection over a 24-hour period to measure kidney function. Blood tests to measure blood glucose levels and kidney function. Regular blood pressure monitoring. If your health care provider suspects diabetic nephropathy, they may: Review your medical history and symptoms. Do a physical exam. Do an ultrasound of your kidneys. Do a procedure to take a sample of kidney tissue for testing (biopsy). How is this treated? The goal of treatment is to prevent or slow down any damage to your kidneys by managing your diabetes. Treatment may include: Controlling your blood pressure. Your target blood pressure may vary.  It may depend on your medical conditions, your age, and other factors. To help control blood pressure, you may be prescribed medicines to lower your blood pressure (ACE inhibitors) or to help your body get rid of excess fluid (diuretics). Controlling your A1c level, also called hemoglobin A1c level.Generally, the goal of treatment is to maintain an A1c level of less than 7%. Controlling your blood glucose. Controlling your blood lipids. If you have high cholesterol, you may need to take lipid-lowering drugs, such as statins. Other treatments may include: Medicines, including insulin  injections. Lifestyle changes, such as losing weight, quitting smoking, or making changes to your diet. If your disease progresses to end-stage kidney failure, treatment may include: Dialysis. This is a procedure to filter your blood with a machine. Kidney transplant. Follow these instructions at home: Eating and drinking Eat healthy foods, and eat healthy snacks between meals. Follow instructions from your health care provider about what you may eat and drink. Limit your salt (sodium), protein, or fluid intake as directed. If you drink alcohol: Limit how much you have to: 0-1 drink a day for women who are not pregnant. 0-2 drinks a day for men. Know how much alcohol is in your drink. In the [place] one  drink equals one 12 oz bottle of beer (355 mL), one 5 oz glass of wine (148 mL), or one 1 oz glass of hard liquor (44 mL). Lifestyle Do not use any products that contain nicotine or tobacco. These products include cigarettes, chewing tobacco, and vaping devices, such as e-cigarettes. If you need help quitting, ask your health care provider. Maintain a healthy weight. Work with your health care provider to lose weight, if needed. Be physically active every day. Ask your health care provider what type of exercise is best for you. Work with your health care provider to manage your blood pressure. General instructions     Follow your diabetes management plan as directed. Check your blood glucose levels as told by your health care provider. Keep your blood glucose in your target range as told by your health care provider. Have your A1c level checked two or more times a year, or as often as told by your health care provider. Measure your blood pressure regularly at home, as told by your health care provider. Take over-the-counter and prescription medicines only as told by your health care provider. These include insulin  and supplements. Keep all follow-up and routine visits. Make sure you get screening tests as directed. Where to find more information American Diabetes Association: diabetes.org Contact a health care provider if: You have trouble keeping your blood glucose in your goal range. Your blood glucose level is higher than 240 mg/dL (86.6 mmol/L) for 2 days in a row. You have swelling in your hands, ankles, or feet. You feel weak, tired, or dizzy. You have sudden muscle tightening (spasms). You have nausea or vomiting. You feel tired all the time. Get help right away if: You have difficulty staying awake. You faint. You have: A seizure. Severe, painful muscle spasms. Shortness of breath. Chest pain. These symptoms may be an emergency. Get help right away. Call 911. Do not wait  to see if the symptoms will go away. Do not drive yourself to the hospital. This information is not intended to replace advice given to you by your health care provider. Make sure you discuss any questions you have with your health care provider. Document Revised: 06/23/2021 Document Reviewed: 06/23/2021 Elsevier Patient Education  2024 [employer] Chronic Venous  Insufficiency Chronic venous insufficiency is a condition that causes the veins in the legs to struggle to pump blood from the legs to the heart. It is also called venous stasis. This condition can happen when the vein walls are stretched, weakened, or damaged. It can also happen when the valves inside the vein are damaged. With the right treatment, you should be able to still lead an active life. What are the causes? Common causes of this condition include: Venous hypertension. This is high blood pressure inside the veins. Sitting or standing too long. This can cause increased blood pressure in the veins of the leg. Deep vein thrombosis (DVT). This is a blood clot that blocks blood flow in a vein. Phlebitis. This is inflammation of a vein. It can cause a blood clot to form. An abnormal growth of cells (tumor) in the area between your hip bones (pelvis). This can cause blood to back up. What increases the risk? Factors that may make you more likely to get this condition include: Having a family history of the condition. Being overweight. Being pregnant. Not getting enough exercise. Smoking. Having a job that requires you to sit or stand in one place for a long time. Being a certain age. Females in their 8s and 31s and males in their 87s are more likely to get this condition. What are the signs or symptoms? Symptoms of this condition include: Varicose veins. These are veins that are enlarged, bulging, or twisted. Skin breakdown or ulcers. Reddened skin or dark discoloration of the skin on the leg between the knee and  ankle. Lipodermatosclerosis. This is brown, smooth, tight, and painful skin just above the ankle. It is often on the inside of the leg. Swelling of the legs. How is this diagnosed? This condition may be diagnosed based on your medical history and a physical exam. You may also need tests, such as: A duplex ultrasound. This shows how blood flows through a blood vessel. Plethysmography. This tests blood flow. Venogram. This looks at the veins using an X-ray and dye. How is this treated? The goals of treatment are to help you return to an active life and to relieve pain. Treatment may include: Wearing compression stockings. These do not cure the condition but can help relieve symptoms. They can also help stop your condition from getting worse. Sclerotherapy. This involves injecting a solution to shrink damaged veins. Surgery. This may include: Vein stripping. This is when a diseased vein is taken out. Laser ablation surgery. This is when blood flow is cut off through the vein. Repairing or remaking a valve inside the affected vein. Follow these instructions at home: Lifestyle Do not use any products that contain nicotine or tobacco. These products include cigarettes, chewing tobacco, and vaping devices, such as e-cigarettes. If you need help quitting, ask your health care provider. Stay active. Exercise, walk, or do other activities. Ask your provider what activities are safe for you. General instructions Take over-the-counter and prescription medicines only as told by your provider. Drink enough fluid to keep your pee (urine) pale yellow. Wear compression stockings as told by your provider. These stockings help to prevent blood clots and reduce swelling in your legs. Keep all follow-up visits. Your provider will check your legs for any changes and adjust your treatment plan as needed. Contact a health care provider if: You have redness, swelling, or more pain in the affected area. You see a  red streak or line that goes up or down from the area.  You have skin breakdown or skin loss. You get an injury in the affected area. You get a fever. Get help right away if: You have severe pain that does not get better with medicine. You get an injury and an open wound in the affected area. Your foot or ankle becomes numb or weak all of a sudden. You have trouble moving your foot or ankle. Your symptoms do not go away or get worse. You have chest pain. You have shortness of breath. These symptoms may be an emergency. Get help right away. Call 911. Do not wait to see if the symptoms will go away. Do not drive yourself to the hospital. This information is not intended to replace advice given to you by your health care provider. Make sure you discuss any questions you have with your health care provider. Document Revised: 03/07/2022 Document Reviewed: 03/07/2022 Elsevier Patient Education  2024 [employer] Chronic Constipation Chronic constipation is when a person poops 3 times a week or less for 3 months or longer. It is most common in older adults. What are the causes? This condition may be caused by: Not drinking enough fluid, eating enough fiber, or getting enough exercise. Pregnancy. An anal fissure. This is a tear in the opening of the butt (anus). Bowel obstruction. This is when there is a blockage in your intestine (bowel). Bowel stricture. This is when your bowel narrows. A long-term (chronic) condition, such as: Diabetes or hypothyroidism. A stroke or injury to the spinal cord. Multiple sclerosis or Parkinson's disease. Colon cancer. Dementia. Inflammatory bowel disease (IBD), rectal prolapse, or hemorrhoids. Taking certain medicines. These include: Some pain medicines. Antacids or iron supplements. Water  pills (diuretics). Some blood pressure medicines. Medicines to keep you from having seizures (anti-seizure medicines). Antidepressants. Medicines for Parkinson's  disease. Other causes may include: Stress. Problems with the nerves and muscles that help you poop. Weak muscles in the area between your hip bones (pelvis). What increases the risk? You may be more at risk for this condition if: You are older than 77 years of age. You are male. You live in a long-term care facility. You have a chronic condition. You have a mental health disorder or eating disorder. What are the signs or symptoms? The main sign of this condition is pooping 3 times a week or less. Other signs and symptoms may include: Pushing hard (straining) to poop. Hard or lumpy poop (stool). Pain when you poop. Discomfort in your lower abdomen. This may include cramps and bloating. Not being able to poop when you need to. Feeling like you still need to poop after you are done. Feeling like there is something in your rectum that is blocking or stopping you from pooping. Blood in your poop or seeing blood on the toilet paper after you poop. Confusion. This is most common in older adults. How is this diagnosed? This condition may be diagnosed based on your symptoms and medical history. You may also need: An exam of your abdomen. A digital rectal exam. For this exam, your health care provider will put a gloved finger into your rectum. Testing. Tests may include: Imaging studies, such as an X-ray, ultrasound, or CT scan. Blood tests. A colonoscopy. This is a test that looks at your colon. A test of how well your anal sphincter works. This is a muscle shaped like a ring. It helps your anus open and close. A test of how well food moves through your colon. An electromyography. This is a test of  the nerves in your pelvis. How is this treated? Treatment depends on the cause. You may need to: Be more active. Drink more fluids. Add fiber to your diet. Sources of fiber include fruits, vegetables, and whole grains. You may also need to take a fiber supplement. Stop or change medicines  that can cause constipation. Take medicines, such as: Pro-motility agents. These help your muscles tighten (contract). This can help you push out poop. Stool softeners. These soften your poop. An osmotic laxative. A laxative is a medicine that helps you poop. This type works by absorbing water  into your colon. Train the muscles in your pelvis with biofeedback. Have surgery. This may be done if something is blocking your bowels. You may also need to see an expert in problems with the digestive system (gastroenterologist). Follow these instructions at home: Medicines Take over-the-counter and prescription medicines only as told by your provider. If you are taking a laxative, take it only as told by your provider. Eating and drinking  Drink enough fluid to keep your pee (urine) pale yellow. Eat foods that are high in fiber, such as beans, whole grains, and fresh fruits and vegetables. Limit foods that are high in fat and processed sugars, such as fried or sweet foods. Stay away from alcohol, caffeine, and soda. These can make constipation worse. General instructions Get physical activity every day. Ask your provider what activities are safe for you. Get colon cancer screenings as told by your provider. Contact a health care provider if: You poop 3 times a week or less. Your poop is hard or lumpy. There is blood on the toilet paper or in your poop. You lose weight for no clear reason. You have pain in your rectum. Poop is leaking out of your rectum. You have nausea or vomiting. This information is not intended to replace advice given to you by your health care provider. Make sure you discuss any questions you have with your health care provider. Document Revised: 12/05/2021 Document Reviewed: 12/05/2021 Elsevier Patient Education  2024 [employer] Carotid Artery Disease  The carotid arteries are the two main blood vessels on either side of the neck. They send blood to the brain,  other parts of the head, and the neck. Carotid artery disease happens when one or both of the carotid arteries get narrow or blocked. This condition is also called carotid artery stenosis. This condition increases your risk for a stroke or a transient ischemic attack (TIA). A TIA is a mini-stroke that causes stroke-like symptoms that go away quickly. What are the causes? This condition is mainly caused by a narrowing and hardening of the carotid arteries. The carotid arteries can become narrow or clogged with a buildup of plaque. Plaque includes: Fat. Cholesterol. Calcium . Other substances. What increases the risk? Having certain medical conditions, such as: High cholesterol. High blood pressure. Diabetes. Being very overweight (obese). Smoking. A family history of cardiovascular disease. Not being active or not exercising. Being male. People who are male have a higher risk of having arteries become narrow and harden earlier in life than people who are male. Being male and older than 77 years old. Being male and older than 77 years old. What are the signs or symptoms? This condition may not have any signs or symptoms until a stroke or TIA happens. In some cases, your doctor may be able to hear a whooshing sound with a stethoscope. This can mean a change in blood flow caused by plaque buildup. How is this treated?  This condition may be treated with more than one treatment. Treatment may include: Lifestyle changes, such as: Quitting smoking. Getting regular exercise as told by your doctor. Eating a healthy diet. Managing stress. Getting to and staying at a healthy weight. Medicines to control: Blood pressure. Cholesterol. Blood clotting. Surgery. You may have: A surgery to remove the blockages in the carotid arteries. A procedure in which a small mesh tube (stent) is used to widen the blocked carotid arteries. Follow these instructions at home: Eating and drinking Follow  instructions from your doctor about what you may eat and drink. It is important to: Eat a healthy diet that includes: A lot of fresh fruits and vegetables. Low-fat (lean) meats. Avoid these foods: Foods that are high in fat. Foods that are high in salt (sodium). Foods that are fried. Foods that are processed. Foods that have few good nutrients (poor nutritional value).   Lifestyle  Keep a healthy weight. Do exercises as told by your doctor to stay active. Each week, you should get one of the following: At least 150 minutes of exercise that raises your heart rate and makes you sweat. At least 75 minutes of exercise that takes a lot of effort. Do not smoke or use any products that contain nicotine or tobacco. If you need help quitting, ask your doctor. Do not drink alcohol if: Your doctor tells you not to drink. You are pregnant, may be pregnant, or are planning to become pregnant. If you drink alcohol: Limit how much you have to: 0-1 drink a day for women. 0-2 drinks a day for men. Know how much alcohol is in your drink. In the [place] one drink equals one 12 oz bottle of beer (355 mL), one 5 oz glass of wine (148 mL), or one 1 oz glass of hard liquor (44 mL). Do not use drugs. Manage your stress. Ask your doctor for tips on how to do this. General instructions Take over-the-counter and prescription medicines only as told by your doctor. Keep all follow-up visits. Your doctor will watch your condition and may need to change your treatment plan over time. Where to find more information American Heart Association: heart.org Get help right away if: You have any signs of a stroke. BE FAST is an easy way to remember the main warning signs: B - Balance. Signs are dizziness, sudden trouble walking, or loss of balance. E - Eyes. Signs are trouble seeing or a change in how you see. F - Face. Signs are sudden weakness or loss of feeling of the face, or the face or eyelid drooping on one  side. A - Arms. Signs are weakness or loss of feeling in an arm. This happens suddenly and usually on one side of the body. S - Speech. Signs are sudden trouble speaking, slurred speech, or trouble understanding what people say. T - Time. Time to call emergency services. Write down what time symptoms started. You have other signs of a stroke, such as: A sudden, very bad headache with no known cause. Feeling like you may vomit (nausea). Vomiting. A seizure. These symptoms may be an emergency. Get help right away. Call 911. Do not wait to see if the symptoms will go away. Do not drive yourself to the hospital. This information is not intended to replace advice given to you by your health car  The above assessment and management plan was discussed with the patient. The patient verbalized understanding of and has agreed to the management plan. Patient is  aware to call the clinic if they develop any new symptoms or if symptoms persist or worsen. Patient is aware when to return to the clinic for a follow-up visit. Patient educated on when it is appropriate to go to the emergency department.  @SIGNATURE @

## 2023-11-30 LAB — THYROID PANEL WITH TSH
Free Thyroxine Index: 2.2 (ref 1.2–4.9)
T3 Uptake Ratio: 32 % (ref 24–39)
T4, Total: 7 ug/dL (ref 4.5–12.0)
TSH: 1.6 u[IU]/mL (ref 0.450–4.500)

## 2023-11-30 LAB — CBC WITH DIFFERENTIAL/PLATELET
Basophils Absolute: 0.1 x10E3/uL (ref 0.0–0.2)
Basos: 1 %
EOS (ABSOLUTE): 0.2 x10E3/uL (ref 0.0–0.4)
Eos: 2 %
Hematocrit: 44.5 % (ref 37.5–51.0)
Hemoglobin: 14.4 g/dL (ref 13.0–17.7)
Immature Grans (Abs): 0 x10E3/uL (ref 0.0–0.1)
Immature Granulocytes: 0 %
Lymphocytes Absolute: 1.5 x10E3/uL (ref 0.7–3.1)
Lymphs: 21 %
MCH: 30.1 pg (ref 26.6–33.0)
MCHC: 32.4 g/dL (ref 31.5–35.7)
MCV: 93 fL (ref 79–97)
Monocytes Absolute: 0.8 x10E3/uL (ref 0.1–0.9)
Monocytes: 11 %
Neutrophils Absolute: 4.5 x10E3/uL (ref 1.4–7.0)
Neutrophils: 65 %
Platelets: 211 x10E3/uL (ref 150–450)
RBC: 4.78 x10E6/uL (ref 4.14–5.80)
RDW: 13.5 % (ref 11.6–15.4)
WBC: 7 x10E3/uL (ref 3.4–10.8)

## 2023-11-30 LAB — MICROALBUMIN / CREATININE URINE RATIO
Creatinine, Urine: 206.8 mg/dL
Microalb/Creat Ratio: 6 mg/g{creat} (ref 0–29)
Microalbumin, Urine: 11.4 ug/mL

## 2023-11-30 LAB — LIPID PANEL
Cholesterol, Total: 123 mg/dL (ref 100–199)
HDL: 32 mg/dL — AB (ref >39)
LDL CALC COMMENT:: 3.8 ratio (ref 0.0–5.0)
LDL Chol Calc (NIH): 60 mg/dL (ref 0–99)
Triglycerides: 185 mg/dL — AB (ref 0–149)
VLDL Cholesterol Cal: 31 mg/dL (ref 5–40)

## 2023-11-30 LAB — CMP14+EGFR
ALT: 18 IU/L (ref 0–44)
AST: 19 IU/L (ref 0–40)
Albumin: 4.1 g/dL (ref 3.8–4.8)
Alkaline Phosphatase: 57 IU/L (ref 47–123)
BUN/Creatinine Ratio: 16 (ref 10–24)
BUN: 20 mg/dL (ref 8–27)
Bilirubin Total: 0.5 mg/dL (ref 0.0–1.2)
CO2: 24 mmol/L (ref 20–29)
Calcium: 9.7 mg/dL (ref 8.6–10.2)
Chloride: 101 mmol/L (ref 96–106)
Creatinine, Ser: 1.23 mg/dL (ref 0.76–1.27)
Globulin, Total: 2.1 g/dL (ref 1.5–4.5)
Glucose: 109 mg/dL — AB (ref 70–99)
Potassium: 4.1 mmol/L (ref 3.5–5.2)
Sodium: 141 mmol/L (ref 134–144)
Total Protein: 6.2 g/dL (ref 6.0–8.5)
eGFR: 61 mL/min/1.73 (ref >59)

## 2023-11-30 LAB — PSA, TOTAL AND FREE
PSA, Free Pct: 40 %
PSA, Free: 0.48 ng/mL
Prostate Specific Ag, Serum: 1.2 ng/mL (ref 0.0–4.0)

## 2023-12-03 ENCOUNTER — Ambulatory Visit: Payer: Self-pay | Admitting: Nurse Practitioner

## 2023-12-03 ENCOUNTER — Other Ambulatory Visit: Payer: Self-pay | Admitting: Internal Medicine

## 2023-12-03 NOTE — Telephone Encounter (Signed)
 Pt seen Riley [name] 11/28/23- risk assessment faxed to [facility].

## 2023-12-03 NOTE — Telephone Encounter (Signed)
 Refill request for Warfarin:  Last INR was 2.5 on 11/13/23 Next INR due 12/25/23 LOV was 04/04/23  Refill approved.

## 2023-12-18 NOTE — Patient Instructions (Addendum)
 SURGICAL WAITING ROOM VISITATION Patients having surgery or a procedure may have no more than 2 support people in the waiting area - these visitors may rotate in the visitor waiting room.   Due to an increase in RSV and influenza rates and associated hospitalizations, children ages 18 and under may not visit patients in [facility] hospitals. If the patient needs to stay at the hospital during part of their recovery, the visitor guidelines for inpatient rooms apply.  PRE-OP VISITATION  Pre-op nurse will coordinate an appropriate time for 1 support person to accompany the patient in pre-op.  This support person may not rotate.  This visitor will be contacted when the time is appropriate for the visitor to come back in the pre-op area.  Please refer to the [facility] website for the visitor guidelines for Inpatients (after your surgery is over and you are in a regular room).  You are not required to quarantine at this time prior to your surgery. However, you must do this: Hand Hygiene often Do NOT share personal items Notify your provider if you are in close contact with someone who has COVID or you develop fever 100.4 or greater, new onset of sneezing, cough, sore throat, shortness of breath or body aches.  If you test positive for Covid or have been in contact with anyone that has tested positive in the last 10 days please notify you surgeon.    Your procedure is scheduled on:  12/26/23  Report to [facility] Main Entrance: [address] entrance where the [employer] is available.   Report to admitting at: 6:50 AM  Call this number if you have any questions or problems the morning of surgery [phone]  FOLLOW ANY ADDITIONAL PRE OP INSTRUCTIONS YOU RECEIVED FROM YOUR SURGEON'S OFFICE!!!  Do not eat food after Midnight the night prior to your surgery/procedure.  After Midnight you may have the following liquids until : 6:20 AM DAY OF SURGERY  Clear Liquid Diet Water  Black  Coffee (sugar ok, NO MILK/CREAM OR CREAMERS)  Tea (sugar ok, NO MILK/CREAM OR CREAMERS) regular and decaf                             Plain Jell-O  with no fruit (NO RED)                                           Fruit ices (not with fruit pulp, NO RED)                                     Popsicles (NO RED)                                                                  Juice: NO CITRUS JUICES: only apple, WHITE grape, WHITE cranberry Sports drinks like Gatorade or Powerade (NO RED)   The day of surgery:  Drink ONE (1) Pre-Surgery Clear G2 at : 6:20 AM the morning of surgery. Drink in one sitting. Do not sip.  This drink was  given to you during your hospital pre-op appointment visit. Nothing else to drink after completing the Pre-Surgery Clear Ensure or G2 : No candy, chewing gum or throat lozenges.    Oral Hygiene is also important to reduce your risk of infection.        Remember - BRUSH YOUR TEETH THE MORNING OF SURGERY WITH YOUR REGULAR TOOTHPASTE  Do NOT smoke after Midnight the night before surgery.  STOP TAKING all Vitamins, Herbs and supplements 1 week before your surgery.   Take ONLY these medicines the morning of surgery with A SIP OF WATER : hydralazine ,metoprolol .Tylenol ,diltiazem  as needed.  If You have been diagnosed with Sleep Apnea - Bring CPAP mask and tubing day of surgery. We will provide you with a CPAP machine on the day of your surgery.                   You may not have any metal on your body including hair pins, jewelry, and body piercing  Do not wear lotions, powders, perfumes / cologne, or deodorant.   Men may shave face and neck.  Contacts, Hearing Aids, dentures or bridgework may not be worn into surgery. DENTURES WILL BE REMOVED PRIOR TO SURGERY PLEASE DO NOT APPLY Poly grip OR ADHESIVES!!!  You may bring a small overnight bag with you on the day of surgery, only pack items that are not valuable. [facility] IS NOT RESPONSIBLE   FOR VALUABLES THAT ARE  LOST OR STOLEN.   Patients discharged on the day of surgery will not be allowed to drive home.  Someone NEEDS to stay with you for the first 24 hours after anesthesia.  Do not bring your home medications to the hospital. The Pharmacy will dispense medications listed on your medication list to you during your admission in the Hospital.  Special Instructions: Bring a copy of your healthcare power of attorney and living will documents the day of surgery, if you wish to have them scanned into your [facility]- EPIC  Please read over the following fact sheets you were given: IF YOU HAVE QUESTIONS ABOUT YOUR PRE-OP INSTRUCTIONS, PLEASE CALL [phone]  PATIENT SIGNATURE_________________________________  NURSE SIGNATURE__________________________________  [facility]- Preparing for Total Shoulder Arthroplasty    Before surgery, you can play an important role. Because skin is not sterile, your skin needs to be as free of germs as possible. You can reduce the number of germs on your skin by using the following products. Benzoyl Peroxide Gel Reduces the number of germs present on the skin Applied twice a day to shoulder area starting two days before surgery    ==================================================================  Please follow these instructions carefully:  BENZOYL PEROXIDE 5% GEL  Please do not use if you have an allergy to benzoyl peroxide.   If your skin becomes reddened/irritated stop using the benzoyl peroxide.  Starting two days before surgery, apply as follows: Apply benzoyl peroxide in the morning and at night. Apply after taking a shower. If you are not taking a shower clean entire shoulder front, back, and side along with the armpit with a clean wet washcloth.  Place a quarter-sized dollop on your shoulder and rub in thoroughly, making sure to cover the front, back, and side of your shoulder, along with the armpit.   2 days before ____ AM   ____ PM               1 day before ____ AM   ____ PM  Do this twice a day for two days.  (Last application is the night before surgery, AFTER using the CHG soap as described below).  Do NOT apply benzoyl peroxide gel on the day of surgery.   Pre-operative 4 CHG Bath Instructions  DYNA-Hex 4 Chlorhexidine  Gluconate 4% Solution Antiseptic 4 fl. oz   You can play a key role in reducing the risk of infection after surgery. Your skin needs to be as free of germs as possible. You can reduce the number of germs on your skin by washing with CHG (chlorhexidine  gluconate) soap before surgery. CHG is an antiseptic soap that kills germs and continues to kill germs even after washing.   DO NOT use if you have an allergy to chlorhexidine /CHG or antibacterial soaps. If your skin becomes reddened or irritated, stop using the CHG and notify one of our RNs at   Please shower with the CHG soap starting 4 days before surgery using the following schedule:     Please keep in mind the following:  DO NOT shave, including legs and underarms, starting the day of your first shower.   You may shave your face at any point before/day of surgery.  Place clean sheets on your bed the day you start using CHG soap. Use a clean washcloth (not used since being washed) for each shower. DO NOT sleep with pets once you start using the CHG.  CHG Shower Instructions:  If you choose to wash your hair and private area, wash first with your normal shampoo/soap.  After you use shampoo/soap, rinse your hair and body thoroughly to remove shampoo/soap residue.  Turn the water  OFF and apply about 3 tablespoons (45 ml) of CHG soap to a CLEAN washcloth.  Apply CHG soap ONLY FROM YOUR NECK DOWN TO YOUR TOES (washing for 3-5 minutes)  DO NOT use CHG soap on face, private areas, open wounds, or sores.  Pay special attention to the area where your surgery is being performed.  If you are having back surgery, having someone wash  your back for you may be helpful. Wait 2 minutes after CHG soap is applied, then you may rinse off the CHG soap.  Pat dry with a clean towel  Put on clean clothes/pajamas   If you choose to wear lotion, please use ONLY the CHG-compatible lotions on the back of this paper.     Additional instructions for the day of surgery: DO NOT APPLY any lotions, deodorants, cologne, or perfumes.   Put on clean/comfortable clothes.  Brush your teeth.  Ask your nurse before applying any prescription medications to the skin.   CHG Compatible Lotions   Aveeno Moisturizing lotion  Cetaphil Moisturizing Cream  Cetaphil Moisturizing Lotion  Clairol Herbal Essence Moisturizing Lotion, Dry Skin  Clairol Herbal Essence Moisturizing Lotion, Extra Dry Skin  Clairol Herbal Essence Moisturizing Lotion, Normal Skin  Curel Age Defying Therapeutic Moisturizing Lotion with Alpha Hydroxy  Curel Extreme Care Body Lotion  Curel Soothing Hands Moisturizing Hand Lotion  Curel Therapeutic Moisturizing Cream, Fragrance-Free  Curel Therapeutic Moisturizing Lotion, Fragrance-Free  Curel Therapeutic Moisturizing Lotion, Original Formula  Eucerin Daily Replenishing Lotion  Eucerin Dry Skin Therapy Plus Alpha Hydroxy Crme  Eucerin Dry Skin Therapy Plus Alpha Hydroxy Lotion  Eucerin Original Crme  Eucerin Original Lotion  Eucerin Plus Crme Eucerin Plus Lotion  Eucerin TriLipid Replenishing Lotion  Keri Anti-Bacterial Hand Lotion  Keri Deep Conditioning Original Lotion Dry Skin Formula Softly Scented  Keri Deep Conditioning Original Lotion, Fragrance Free Sensitive  Skin Formula  Keri Lotion Fast Absorbing Fragrance Free Sensitive Skin Formula  Keri Lotion Fast Absorbing Softly Scented Dry Skin Formula  Keri Original Lotion  Keri Skin Renewal Lotion Keri Silky Smooth Lotion  Keri Silky Smooth Sensitive Skin Lotion  Nivea Body Creamy Conditioning Oil  Nivea Body Extra Enriched Lotion  Nivea Body Original Lotion   Nivea Body Sheer Moisturizing Lotion Nivea Crme  Nivea Skin Firming Lotion  NutraDerm 30 Skin Lotion  NutraDerm Skin Lotion  NutraDerm Therapeutic Skin Cream  NutraDerm Therapeutic Skin Lotion  ProShield Protective Hand Cream  Provon moisturizing lotion  Incentive Spirometer  An incentive spirometer is a tool that can help keep your lungs clear and active. This tool measures how well you are filling your lungs with each breath. Taking long deep breaths may help reverse or decrease the chance of developing breathing (pulmonary) problems (especially infection) following: A long period of time when you are unable to move or be active. BEFORE THE PROCEDURE  If the spirometer includes an indicator to show your best effort, your nurse or respiratory therapist will set it to a desired goal. If possible, sit up straight or lean slightly forward. Try not to slouch. Hold the incentive spirometer in an upright position. INSTRUCTIONS FOR USE  Sit on the edge of your bed if possible, or sit up as far as you can in bed or on a chair. Hold the incentive spirometer in an upright position. Breathe out normally. Place the mouthpiece in your mouth and seal your lips tightly around it. Breathe in slowly and as deeply as possible, raising the piston or the ball toward the top of the column. Hold your breath for 3-5 seconds or for as long as possible. Allow the piston or ball to fall to the bottom of the column. Remove the mouthpiece from your mouth and breathe out normally. Rest for a few seconds and repeat Steps 1 through 7 at least 10 times every 1-2 hours when you are awake. Take your time and take a few normal breaths between deep breaths. The spirometer may include an indicator to show your best effort. Use the indicator as a goal to work toward during each repetition. After each set of 10 deep breaths, practice coughing to be sure your lungs are clear. If you have an incision (the cut made at the time  of surgery), support your incision when coughing by placing a pillow or rolled up towels firmly against it. Once you are able to get out of bed, walk around indoors and cough well. You may stop using the incentive spirometer when instructed by your caregiver.  RISKS AND COMPLICATIONS Take your time so you do not get dizzy or light-headed. If you are in pain, you may need to take or ask for pain medication before doing incentive spirometry. It is harder to take a deep breath if you are having pain. AFTER USE Rest and breathe slowly and easily. It can be helpful to keep track of a log of your progress. Your caregiver can provide you with a simple table to help with this. If you are using the spirometer at home, follow these instructions: SEEK MEDICAL CARE IF:  You are having difficultly using the spirometer. You have trouble using the spirometer as often as instructed. Your pain medication is not giving enough relief while using the spirometer. You develop fever of 100.5 F (38.1 C) or higher. SEEK IMMEDIATE MEDICAL CARE IF:  You cough up bloody sputum that had not  been present before. You develop fever of 102 F (38.9 C) or greater. You develop worsening pain at or near the incision site. MAKE SURE YOU:  Understand these instructions. Will watch your condition. Will get help right away if you are not doing well or get worse. Document Released: 07/03/2006 Document Revised: 05/15/2011 Document Reviewed: 09/03/2006 [facility] Patient Information 2014 [place], [place].   ________________________________________________________________________

## 2023-12-19 ENCOUNTER — Encounter (HOSPITAL_COMMUNITY)
Admission: RE | Admit: 2023-12-19 | Discharge: 2023-12-19 | Disposition: A | Discharge status: Home / Self Care | Source: Ambulatory Visit | Attending: Orthopaedic Surgery | Admitting: Orthopaedic Surgery

## 2023-12-19 ENCOUNTER — Other Ambulatory Visit: Payer: Self-pay

## 2023-12-19 ENCOUNTER — Encounter (HOSPITAL_COMMUNITY): Payer: Self-pay

## 2023-12-19 VITALS — BP 136/88 | HR 75 | Temp 97.9°F | Ht 72.0 in | Wt 234.0 lb

## 2023-12-19 DIAGNOSIS — I480 Paroxysmal atrial fibrillation: Secondary | ICD-10-CM | POA: Insufficient documentation

## 2023-12-19 DIAGNOSIS — Z01818 Encounter for other preprocedural examination: Secondary | ICD-10-CM | POA: Diagnosis present

## 2023-12-19 DIAGNOSIS — M19011 Primary osteoarthritis, right shoulder: Secondary | ICD-10-CM | POA: Insufficient documentation

## 2023-12-19 DIAGNOSIS — I10 Essential (primary) hypertension: Secondary | ICD-10-CM | POA: Insufficient documentation

## 2023-12-19 DIAGNOSIS — Z7901 Long term (current) use of anticoagulants: Secondary | ICD-10-CM | POA: Diagnosis not present

## 2023-12-19 DIAGNOSIS — Z9889 Other specified postprocedural states: Secondary | ICD-10-CM | POA: Diagnosis not present

## 2023-12-19 DIAGNOSIS — Z79899 Other long term (current) drug therapy: Secondary | ICD-10-CM | POA: Diagnosis not present

## 2023-12-19 DIAGNOSIS — Z7984 Long term (current) use of oral hypoglycemic drugs: Secondary | ICD-10-CM | POA: Insufficient documentation

## 2023-12-19 DIAGNOSIS — G4733 Obstructive sleep apnea (adult) (pediatric): Secondary | ICD-10-CM | POA: Diagnosis not present

## 2023-12-19 DIAGNOSIS — E119 Type 2 diabetes mellitus without complications: Secondary | ICD-10-CM | POA: Diagnosis not present

## 2023-12-19 DIAGNOSIS — Z01812 Encounter for preprocedural laboratory examination: Secondary | ICD-10-CM | POA: Insufficient documentation

## 2023-12-19 DIAGNOSIS — K219 Gastro-esophageal reflux disease without esophagitis: Secondary | ICD-10-CM | POA: Insufficient documentation

## 2023-12-19 HISTORY — DX: Unspecified osteoarthritis, unspecified site: M19.90

## 2023-12-19 HISTORY — DX: Cardiac arrhythmia, unspecified: I49.9

## 2023-12-19 HISTORY — DX: Malignant (primary) neoplasm, unspecified: C80.1

## 2023-12-19 LAB — PROTIME-INR
INR: 2.8 — ABNORMAL HIGH (ref 0.8–1.2)
Prothrombin Time: 30.4 s — ABNORMAL HIGH (ref 11.4–15.2)

## 2023-12-19 LAB — BASIC METABOLIC PANEL WITH GFR
Anion gap: 13 (ref 5–15)
BUN: 21 mg/dL (ref 8–23)
CO2: 27 mmol/L (ref 22–32)
Calcium: 9.7 mg/dL (ref 8.9–10.3)
Chloride: 103 mmol/L (ref 98–111)
Creatinine, Ser: 1.06 mg/dL (ref 0.61–1.24)
GFR, Estimated: >60 mL/min (ref >60)
Glucose, Bld: 114 mg/dL — ABNORMAL HIGH (ref 70–99)
Potassium: 4.1 mmol/L (ref 3.5–5.1)
Sodium: 142 mmol/L (ref 135–145)

## 2023-12-19 LAB — CBC
HCT: 45.6 % (ref 39.0–52.0)
Hemoglobin: 14.8 g/dL (ref 13.0–17.0)
MCH: 30 pg (ref 26.0–34.0)
MCHC: 32.5 g/dL (ref 30.0–36.0)
MCV: 92.3 fL (ref 80.0–100.0)
Platelets: 215 K/uL (ref 150–400)
RBC: 4.94 MIL/uL (ref 4.22–5.81)
RDW: 14.3 % (ref 11.5–15.5)
WBC: 7.2 K/uL (ref 4.0–10.5)
nRBC: 0 % (ref 0.0–0.2)

## 2023-12-19 LAB — SURGICAL PCR SCREEN
MRSA, PCR: NEGATIVE
Staphylococcus aureus: NEGATIVE

## 2023-12-19 LAB — GLUCOSE, CAPILLARY: Glucose-Capillary: 130 mg/dL — ABNORMAL HIGH (ref 70–99)

## 2023-12-19 LAB — APTT: aPTT: 41 s — ABNORMAL HIGH (ref 24–36)

## 2023-12-19 NOTE — Progress Notes (Signed)
 For Anesthesia: PCP - Deitra Morton Hummer, Nena, NP  Cardiologist - Mallipeddi, Diannah SQUIBB, MD  Elaine Miriam BRAVO, NP  : Clearance: 11/22/23 Bowel Prep reminder:  Chest x-ray -  EKG - 04/04/23 Stress Test -  ECHO - 10/13/22 Cardiac Cath -  Pacemaker/ICD device last checked: Pacemaker orders received: Device Rep notified:  Spinal Cord Stimulator:N/A  Sleep Study - Yes CPAP - Yes  Fasting Blood Sugar - N/A Checks Blood Sugar ___0__ times a day Date and result of last Hgb A1c-6.2:: 11/29/23  Last dose of GLP1 agonist-   N/A GLP1 instructions: Hold 7 days prior to schedule (Hold 24 hours-daily)   Last dose of SGLT-2 inhibitors-  N/A SGLT-2 instructions: Hold 72 hours prior to surgery  Blood Thinner Instructions:Coumadin  will be hold after: 12/20/23 Last Dose: Time last taken:  Aspirin Instructions: N/A Last Dose: Time last taken:  Activity level: Can go up a flight of stairs and activities of daily living without stopping and without chest pain and/or shortness of breath   Able to exercise without chest pain and/or shortness of breath  Anesthesia review: Hx: Afib,DIA,HTN,OSA(CPAP).  Patient denies shortness of breath, fever, cough and chest pain at PAT appointment   Patient verbalized understanding of instructions that were reviewed over the telephone.

## 2023-12-20 ENCOUNTER — Ambulatory Visit: Admitting: Gastroenterology

## 2023-12-20 ENCOUNTER — Encounter: Payer: Self-pay | Admitting: Gastroenterology

## 2023-12-20 DIAGNOSIS — K5909 Other constipation: Secondary | ICD-10-CM | POA: Diagnosis not present

## 2023-12-20 DIAGNOSIS — K5900 Constipation, unspecified: Secondary | ICD-10-CM

## 2023-12-20 MED ORDER — LINACLOTIDE 290 MCG PO CAPS: 290.0000 ug | ORAL_CAPSULE | Freq: Every morning | ORAL | 3 refills | Status: AC

## 2023-12-20 NOTE — Progress Notes (Signed)
 Gastroenterology Office Note     Primary Care Physician:  Deitra Morton Sebastian Nena, NP  Primary Gastroenterologist: Dr. Shaaron    Chief Complaint   Chief Complaint  Patient presents with   Follow-up    Here for further refills on Linzess      History of Present Illness   Riley [name] is a 78 y.o. male presenting today with a history of constipation, prior adenomas in 2019 but no surveillance due to age, last seen in 2024 and here for routine follow-up for refills of Linzess .   Has previously trialed Miralax , fiber, Linzess  72 mcg and 145 mcg. Linzess  290 mcg works best.   Has had multiple family health issues, personal non-GI health issues over the past year. Upcoming shoulder surgery next week. From a GI standpoint, he states he is doing well.    Takes Linzess  2-3 times per week and likes this scenario. After eating a meal, will get sleepy. Will wear off after half an hour. After nasal surgery he was told would have some sleepiness, and he experienced this for awhile and now improving. A1c 6.2 recently. No dysphagia or GERD. No abdominal pain, bloating. No overt GI bleeding.    Colonoscopy 2019: two adenomas, no surveillance due to age.   Past Medical History:  Diagnosis Date   Arthritis    Cancer (HCC)    skin   Chronic anticoagulation    Chronic back pain    Diabetes mellitus, type 2 (HCC)    no insulin    Dysrhythmia    Edema    GERD (gastroesophageal reflux disease)    Hyperlipidemia    Hypertension    normal coronary angiography and normal EF in 12/98   Nodule of left lung    Granuloma in left upper lobe   Paroxysmal atrial fibrillation (HCC)    12/2005; sinus bradycardia secondary to medications   Sleep apnea     Past Surgical History:  Procedure Laterality Date   APPENDECTOMY  1998   COLONOSCOPY N/A 11/26/2012   MFM:Hmjiz 2 hemorrhoids. Pancolonic diverticulosis. Colonic polyps removed as described above. One hemostasis clip applied. TUBULAR ADENOMA.  Surveillance Sept 2019.    COLONOSCOPY N/A 02/06/2018   two 3-5 mm cecal polyps, pancolonic diverticulosis. tubular adenoma. no surveillance due to age   INSERTION OF MESH N/A 01/27/2013   Procedure: INSERTION OF MESH;  Surgeon: Oneil DELENA Budge, MD;  Location: [facility] ORS;  Service: General;  Laterality: N/A;   NASAL SEPTOPLASTY W/ TURBINOPLASTY Bilateral 08/24/2023   Procedure: SEPTOPLASTY, NOSE, WITH NASAL TURBINATE REDUCTION;  Surgeon: Karis Clunes, MD;  Location: [facility] OR;  Service: ENT;  Laterality: Bilateral;   POLYPECTOMY  02/06/2018   Procedure: POLYPECTOMY;  Surgeon: Shaaron Lamar HERO, MD;  Location: [facility] END[facility] SUITE;  Service: Endoscopy;;  (colon)   TONSILLECTOMY     as a child   UMBILICAL HERNIA REPAIR N/A 01/27/2013   Procedure: HERNIA REPAIR UMBILICAL ADULT;  Surgeon: Oneil DELENA Budge, MD;  Location: [facility] OR[facility];  Service: General;  Laterality: N/A;    Current Outpatient Medications  Medication Sig Dispense Refill   acetaminophen  (TYLENOL ) 500 MG tablet Take 500 mg by mouth every 6 (six) hours as needed (for pain.).     calcium  carbonate (TUMS - DOSED IN MG ELEMENTAL CALCIUM ) 500 MG chewable tablet Chew 1 tablet by mouth as needed for indigestion or heartburn.     chlorthalidone  (HYGROTON ) 25 MG tablet Take 12.5 mg by mouth every other day.     diltiazem  (  CARDIZEM ) 30 MG tablet Take 1 tablet (30 mg total) by mouth daily as needed (Palpations). 30 tablet 11   DM-APAP-CPM (CORICIDIN HBP) 10-325-2 MG TABS Take 1 tablet by mouth 2 (two) times daily as needed (cold symptoms).     doxazosin  (CARDURA ) 4 MG tablet TAKE THREE TABLETS BY MOUTH EVERY EVENING 270 tablet 3   furosemide  (LASIX ) 20 MG tablet Take 20 mg by mouth daily as needed for fluid or edema.     glimepiride (AMARYL) 1 MG tablet Take 1 mg by mouth daily.     hydrALAZINE  (APRESOLINE ) 100 MG tablet Take 1 tablet (100 mg total) by mouth 3 (three) times daily. 90 tablet 1   Hydrocortisone (PREPARATION H EX) Place 1 application rectally daily as needed  (irritation/discomfort).      linaclotide  (LINZESS ) 290 MCG CAPS capsule TAKE ONE CAPSULE BY MOUTH EVERY MORNING (Patient taking differently: Take 290 mcg by mouth 2 (two) times a week. Tues and Sat - Will take an additional dose daily as needed) 30 capsule 0   lisinopril  (ZESTRIL ) 20 MG tablet TAKE (1) TABLET BY MOUTH ONCE DAILY. 90 tablet 3   Magnesium  250 MG TABS Take 125 mg by mouth at bedtime.     metFORMIN  (GLUCOPHAGE ) 1000 MG tablet Take 1,000 mg by mouth 2 (two) times daily.     metoprolol  tartrate (LOPRESSOR ) 50 MG tablet Take 50 mg by mouth 2 (two) times daily.     Multiple Vitamin (MULTIVITAMIN) tablet Take 1 tablet by mouth daily.     pravastatin  (PRAVACHOL ) 20 MG tablet Take 1 tablet (20 mg total) by mouth every evening. 90 tablet 3   warfarin (COUMADIN ) 5 MG tablet TAKE 1 TABLET BY MOUTH ONCE DAILY, EXCEPT TAKE 1 AND 1/2 TABLETS ON SATURDAYS OR AS DIRECTED. 40 tablet 5   No current facility-administered medications for this visit.    Allergies as of 12/20/2023   (No Known Allergies)    Family History  Problem Relation Age of Onset   Heart disease Father    Heart disease Mother    Bronchitis Mother    Allergies Other        whole family   Colon cancer Neg Hx     Social History   Socioeconomic History   Marital status: Married    Spouse name: Not on file   Number of children: Not on file   Years of education: Not on file   Highest education level: Not on file  Occupational History   Occupation: Retired    Associate Professor: [employer]    Comment: Arboriculturist  Tobacco Use   Smoking status: Never   Smokeless tobacco: Never  Vaping Use   Vaping status: Never Used  Substance and Sexual Activity   Alcohol use: No    Alcohol/week: 0.0 standard drinks of alcohol   Drug use: Never   Sexual activity: Yes    Partners: Female    Comment: married  Other Topics Concern   Not on file  Social History Narrative   Not on file   Social Drivers of Health    Financial Resource Strain: Not on file  Food Insecurity: No Food Insecurity (11/29/2023)   Hunger Vital Sign    Worried About Running Out of Food in the Last Year: Never true    Ran Out of Food in the Last Year: Never true  Transportation Needs: Not on file  Physical Activity: Not on file  Stress: Not on file  Social Connections: Not on file  Intimate Partner Violence: Not At Risk (11/29/2023)   Humiliation, Afraid, Rape, and Kick questionnaire    Fear of Current or Ex-Partner: No    Emotionally Abused: No    Physically Abused: No    Sexually Abused: No     Review of Systems   Gen: Denies any fever, chills, fatigue, weight loss, lack of appetite.  CV: Denies chest pain, heart palpitations, peripheral edema, syncope.  Resp: Denies shortness of breath at rest or with exertion. Denies wheezing or cough.  GI: Denies dysphagia or odynophagia. Denies jaundice, hematemesis, fecal incontinence. GU : Denies urinary burning, urinary frequency, urinary hesitancy MS: Denies joint pain, muscle weakness, cramps, or limitation of movement.  Derm: Denies rash, itching, dry skin Psych: Denies depression, anxiety, memory loss, and confusion Heme: Denies bruising, bleeding, and enlarged lymph nodes.   Physical Exam   BP 125/80 (BP Location: Right Arm, Patient Position: Sitting, Cuff Size: Large)   Pulse 72   Temp 97.6 F (36.4 C) (Oral)   Resp (!) 97   Ht 6' (1.829 m)   Wt 236 lb 9.6 oz (107.3 kg)   BMI 32.09 kg/m  General:   Alert and oriented. Pleasant and cooperative. Well-nourished and well-developed.  Head:  Normocephalic and atraumatic. Eyes:  Without icterus Abdomen:  +BS, obese and round but soft, large diastasis recti, no obvious HSM Rectal:  Deferred  Msk:  Symmetrical without gross deformities. Normal posture. Extremities:  Without edema. Neurologic:  Alert and  oriented x4 Skin:  Intact without significant lesions or rashes. Psych:  Alert and cooperative. Normal mood and  affect.   Assessment/Plan   Riley [name] is a 78 y.o. male presenting today with a history of chronic constipation and doing well on novel dosing of Linzess  290 mcg a few days a week.   He has no alarm signs/symptoms from a GI standpoint. No need for updated colonoscopy in light of age. We can see him back in 1 year or sooner if needed. Refills provided.     Therisa MICAEL Stager, PhD, ANP-BC [facility]

## 2023-12-20 NOTE — Patient Instructions (Signed)
 I have refilled Linzess  for you!  We will see you back in 1 year or sooner if needed.  Happy early birthday!  I enjoyed seeing you again today! I value our relationship and want to provide genuine, compassionate, and quality care. You may receive a survey regarding your visit with me, and I welcome your feedback! Thanks so much for taking the time to complete this. I look forward to seeing you again.      Therisa MICAEL Stager, PhD, ANP-BC [facility]

## 2023-12-21 NOTE — Progress Notes (Signed)
 Case: [id number] Date/Time: 12/26/23 0910   Procedure: ARTHROPLASTY, SHOULDER, TOTAL, REVERSE (Right: Shoulder)   Anesthesia type: General   Diagnosis: Primary osteoarthritis of right shoulder [M19.011]   Pre-op diagnosis: osteoarthritis of right shoulder   Location: [facility] 06 / [facility]   Surgeons: Cristy Bonner DASEN, MD       DISCUSSION: Riley [name] is a 78 yo male with PMH of PAF on warfarin, hypertension, OSA (uses CPAP), GERD, T2DM (A1c 6.2), arthritis  Also with hx of chronic nasal obstruction and underwent septoplasty on 08/24/23 which was without complications.  Patient follows with cardiology for longstanding paroxysmal A-fib on warfarin.  Continues with rate control.   Nuclear stress 11/2016 showed findings consistent with prior moderate-sized MI with mild peri-infarct ischemia, overall low risk.  Echo 10/2022 showed EF 50 to 55%, RV systolic function mildly reduced, RVSP 38.8 mmHg, CVP 15 mmHg, no significant valvular abnormalities (treated for volume overload at that time with Lasix ). Last seen in clinic on 04/04/2023 noted to be doing well.  Advised to continue current medications and follow-up in 1 year.  Cardiac clearance televisit done on 11/22/2023 the patient is cleared for surgery:  Preoperative Cardiovascular Risk Assessment: The patient was advised that if he develops new symptoms prior to surgery to contact our office to arrange for a follow-up visit, and he verbalized understanding. According to the Revised Cardiac Risk Index (RCRI), his Perioperative Risk of Major Cardiac Event is (%): 0.4 His Functional Capacity in METs is: 8.27 according to the Duke Activity Status Index (DASI). Therefore, based on ACC/AHA guidelines, patient would be at acceptable risk for the planned procedure without further cardiovascular testing Per office protocol, patient can hold warfarin for 5 days prior to procedure.   Patient will not need bridging with Lovenox  (enoxaparin ) around procedure Please  resume warfarin as soon as possible postprocedure, at the discretion of the surgeon.   Last dose of warfarin:12/20/23  Patient follows with pulmonology for OSA.  He uses a CPAP.  Stable at last visit on 11/28/2023. Risk assessment done:  Preoperative pulmonary risk assessment for right shoulder surgery Intermediate risk for pulmonary complications due to severe sleep apnea. Surgery should be performed in a hospital setting due to OSA and atrial fibrillation. Postoperative monitoring in the recovery unit is necessary. - Recommend surgery in a hospital setting - Inform surgeon of severe sleep apnea and need for careful monitoring post-anesthesia - Advise postoperative use of BiPAP - Encourage ambulation post-surgery to prevent blood clots and pneumonia - Instruct on wearing compression stockings - Use incentive spirometer post-surgery   VS: BP 136/88   Pulse 75   Temp 36.6 C (Oral)   Ht 6' (1.829 m)   Wt 106.1 kg   SpO2 97%   BMI 31.74 kg/m   PROVIDERS: [facility], Nena, NP   LABS: Labs reviewed: Acceptable for surgery. PT/INR ordered for DOS (all labs ordered are listed, but only abnormal results are displayed)  Labs Reviewed  BASIC METABOLIC PANEL WITH GFR - Abnormal; Notable for the following components:      Result Value   Glucose, Bld 114 (*)    All other components within normal limits  PROTIME-INR - Abnormal; Notable for the following components:   Prothrombin Time 30.4 (*)    INR 2.8 (*)    All other components within normal limits  APTT - Abnormal; Notable for the following components:   aPTT 41 (*)    All other components within normal limits  GLUCOSE, CAPILLARY -  Abnormal; Notable for the following components:   Glucose-Capillary 130 (*)    All other components within normal limits  SURGICAL PCR SCREEN  CBC    EKG 04/04/23: Atrial fibrillation with premature ventricular or aberrantly conducted complexes. Rate 73. Left axis deviation. Right bundle  branch block. Inferior infarct , age undetermined  TTE 10/13/2022: 1. Left ventricular ejection fraction, by estimation, is 50 to 55%. The  left ventricle has low normal function. The left ventricle has no regional  wall motion abnormalities. Left ventricular diastolic function could not  be evaluated. Elevated left  ventricular end-diastolic pressure. There is the interventricular septum  is flattened in systole and diastole, consistent with right ventricular  pressure and volume overload.   2. Right ventricular systolic function is mildly reduced. The right  ventricular size is mildly enlarged. There is mildly elevated pulmonary  artery systolic pressure. The estimated right ventricular systolic  pressure is 38.8 mmHg.   3. Left atrial size was severely dilated.   4. The mitral valve is normal in structure. Trivial mitral valve  regurgitation. No evidence of mitral stenosis.   5. The aortic valve is tricuspid. There is moderate calcification of the  aortic valve. Aortic valve regurgitation is not visualized. Aortic valve  sclerosis/calcification is present, without any evidence of aortic  stenosis.   6. The inferior vena cava is dilated in size with <50% respiratory  variability, suggesting right atrial pressure of 15 mmHg.  She did not react   Nuclear stress 11/09/2016: There was no ST segment deviation noted during stress. The left ventricular ejection fraction is normal (55-65%). 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 This is a low risk study. Overall mild area of mycardoium currently at jeopardy  Past Medical History:  Diagnosis Date   Arthritis    Cancer (HCC)    skin   Chronic anticoagulation    Chronic back pain    Diabetes mellitus, type 2 (HCC)    no insulin    Dysrhythmia    Edema    GERD (gastroesophageal reflux disease)    Hyperlipidemia    Hypertension    normal  coronary angiography and normal EF in 12/98   Nodule of left lung    Granuloma in left upper lobe   Paroxysmal atrial fibrillation (HCC)    12/2005; sinus bradycardia secondary to medications   Sleep apnea     Past Surgical History:  Procedure Laterality Date   APPENDECTOMY  1998   COLONOSCOPY N/A 11/26/2012   MFM:Hmjiz 2 hemorrhoids. Pancolonic diverticulosis. Colonic polyps removed as described above. One hemostasis clip applied. TUBULAR ADENOMA. Surveillance Sept 2019.    COLONOSCOPY N/A 02/06/2018   two 3-5 mm cecal polyps, pancolonic diverticulosis. tubular adenoma. no surveillance due to age   INSERTION OF MESH N/A 01/27/2013   Procedure: INSERTION OF MESH;  Surgeon: Oneil DELENA Budge, MD;  Location: [facility];  Service: General;  Laterality: N/A;   NASAL SEPTOPLASTY W/ TURBINOPLASTY Bilateral 08/24/2023   Procedure: SEPTOPLASTY, NOSE, WITH NASAL TURBINATE REDUCTION;  Surgeon: Karis Clunes, MD;  Location: [facility] OR;  Service: ENT;  Laterality: Bilateral;   POLYPECTOMY  02/06/2018   Procedure: POLYPECTOMY;  Surgeon: Shaaron Lamar HERO, MD;  [facility];  Service: Endoscopy;;  (colon)   TONSILLECTOMY     as a child   UMBILICAL HERNIA REPAIR N/A 01/27/2013   Procedure: HERNIA REPAIR UMBILICAL ADULT;  Surgeon: Oneil DELENA Budge, MD;  Location: [facility];  Service: General;  Laterality: N/A;    MEDICATIONS:  acetaminophen  (TYLENOL ) 500 MG tablet   calcium  carbonate (TUMS - DOSED IN MG ELEMENTAL CALCIUM ) 500 MG chewable tablet   chlorthalidone  (HYGROTON ) 25 MG tablet   diltiazem  (CARDIZEM ) 30 MG tablet   DM-APAP-CPM (CORICIDIN HBP) 10-325-2 MG TABS   doxazosin  (CARDURA ) 4 MG tablet   furosemide  (LASIX ) 20 MG tablet   glimepiride (AMARYL) 1 MG tablet   hydrALAZINE  (APRESOLINE ) 100 MG tablet   Hydrocortisone (PREPARATION H EX)   linaclotide  (LINZESS ) 290 MCG CAPS capsule   lisinopril  (ZESTRIL ) 20 MG tablet   Magnesium  250 MG TABS   metFORMIN  (GLUCOPHAGE ) 1000 MG tablet   metoprolol   tartrate (LOPRESSOR ) 50 MG tablet   Multiple Vitamin (MULTIVITAMIN) tablet   pravastatin  (PRAVACHOL ) 20 MG tablet   warfarin (COUMADIN ) 5 MG tablet   No current facility-administered medications for this encounter.   Burnard CHRISTELLA Odis DEVONNA [facility] Phone [phone] 12/21/2023 10:21 AM

## 2023-12-21 NOTE — Anesthesia Preprocedure Evaluation (Signed)
 Anesthesia Evaluation  Patient identified by MRN, date of birth, ID band Patient awake    Reviewed: Allergy & Precautions, NPO status , Patient's Chart, lab work & pertinent test results  History of Anesthesia Complications Negative for: history of anesthetic complications  Airway Mallampati: III  TM Distance: >3 FB Neck ROM: Full   Comment: Previous grade I view with MAC 4 Dental  (+) Dental Advisory Given   Pulmonary neg shortness of breath, sleep apnea and Continuous Positive Airway Pressure Ventilation , neg COPD, neg recent URI   breath sounds clear to auscultation       Cardiovascular hypertension (hydralazine , lisinopril , metoprolol , chlorthalidone ), Pt. on medications (-) angina + CAD  (-) Past MI and (-) Cardiac Stents + dysrhythmias (RBBB, PVCs) Atrial Fibrillation  Rhythm:Irregular Rate:Normal  HLD  TTE 10/13/2022: IMPRESSIONS    1. Left ventricular ejection fraction, by estimation, is 50 to 55%. The  left ventricle has low normal function. The left ventricle has no regional  wall motion abnormalities. Left ventricular diastolic function could not  be evaluated. Elevated left  ventricular end-diastolic pressure. There is the interventricular septum  is flattened in systole and diastole, consistent with right ventricular  pressure and volume overload.   2. Right ventricular systolic function is mildly reduced. The right  ventricular size is mildly enlarged. There is mildly elevated pulmonary  artery systolic pressure. The estimated right ventricular systolic  pressure is 38.8 mmHg.   3. Left atrial size was severely dilated.   4. The mitral valve is normal in structure. Trivial mitral valve  regurgitation. No evidence of mitral stenosis.   5. The aortic valve is tricuspid. There is moderate calcification of the  aortic valve. Aortic valve regurgitation is not visualized. Aortic valve  sclerosis/calcification is  present, without any evidence of aortic  stenosis.   6. The inferior vena cava is dilated in size with <50% respiratory  variability, suggesting right atrial pressure of 15 mmHg.     Neuro/Psych neg Seizures  Neuromuscular disease (chronic back pain)    GI/Hepatic Neg liver ROS,GERD  ,,  Endo/Other  diabetes, Type 2, Oral Hypoglycemic Agents    Renal/GU CRFRenal disease     Musculoskeletal  (+) Arthritis , Osteoarthritis,    Abdominal  (+) + obese  Peds  Hematology negative hematology ROS (+) Lab Results      Component                Value               Date                      WBC                      7.2                 12/19/2023                HGB                      14.8                12/19/2023                HCT                      45.6  12/19/2023                MCV                      92.3                12/19/2023                PLT                      215                 12/19/2023              Anesthesia Other Findings Last warfarin: 12/20/2023  INR today 1.1  Reproductive/Obstetrics                              Anesthesia Physical Anesthesia Plan  ASA: 3  Anesthesia Plan: General   Post-op Pain Management: Tylenol  PO (pre-op)* and Regional block*   Induction: Intravenous  PONV Risk Score and Plan: 2 and Ondansetron , Dexamethasone  and Treatment may vary due to age or medical condition  Airway Management Planned: Oral ETT  Additional Equipment:   Intra-op Plan:   Post-operative Plan: Extubation in OR  Informed Consent: I have reviewed the patients History and Physical, chart, labs and discussed the procedure including the risks, benefits and alternatives for the proposed anesthesia with the patient or authorized representative who has indicated his/her understanding and acceptance.     Dental advisory given  Plan Discussed with: CRNA and Anesthesiologist  Anesthesia Plan Comments: (See PAT note  from 10/15  Discussed potential risks of nerve blocks including, but not limited to, infection, bleeding, nerve damage, seizures, pneumothorax, respiratory depression, and potential failure of the block. Alternatives to nerve blocks discussed. All questions answered.  Risks of general anesthesia discussed including, but not limited to, sore throat, hoarse voice, chipped/damaged teeth, injury to vocal cords, nausea and vomiting, allergic reactions, lung infection, heart attack, stroke, and death. All questions answered. )         Anesthesia Quick Evaluation

## 2023-12-25 ENCOUNTER — Encounter

## 2023-12-25 NOTE — H&P (Signed)
 PREOPERATIVE H&P  Chief Complaint: osteoarthritis of right shoulder  HPI: Riley [name] is a 78 y.o. male who is scheduled for Procedure(s): ARTHROPLASTY, SHOULDER, TOTAL, REVERSE.   Patient has a past medical history significant for PAF on warfarin, hypertension, OSA (uses CPAP), GERD, T2DM (A1c 6.2), arthritis .   The patient is the father of Butler Pinal, who is a scrub tech at the [employer].  He had a left knee injection a few weeks ago.  He did great for about two weeks and then the pain returned.  He is still having some buckling in the left knee and weakness.  He wants to know what his options are.  He is also having significant pain in his right shoulder that is the more pressing issue today.  He has painful range of motion.  He states about 15 years ago, he was told he had a tear of his rotator cuff.  He never did any surgery for it.  It would bother him off and on, but recently it has become extreme pain.  He has limited mobility.  He is very active and likes to do things around the house and so this is frustrating for him.  He wants to know what his options are going forward.  Symptoms are rated as moderate to severe, and have been worsening.  This is significantly impairing activities of daily living.    Please see clinic note for further details on this patient's care.    He has elected for surgical management.   Past Medical History:  Diagnosis Date   Arthritis    Cancer (HCC)    skin   Chronic anticoagulation    Chronic back pain    Diabetes mellitus, type 2 (HCC)    no insulin    Dysrhythmia    Edema    GERD (gastroesophageal reflux disease)    Hyperlipidemia    Hypertension    normal coronary angiography and normal EF in 12/98   Nodule of left lung    Granuloma in left upper lobe   Paroxysmal atrial fibrillation (HCC)    12/2005; sinus bradycardia secondary to medications   Sleep apnea    Past Surgical History:  Procedure Laterality  Date   APPENDECTOMY  1998   COLONOSCOPY N/A 11/26/2012   MFM:Hmjiz 2 hemorrhoids. Pancolonic diverticulosis. Colonic polyps removed as described above. One hemostasis clip applied. TUBULAR ADENOMA. Surveillance Sept 2019.    COLONOSCOPY N/A 02/06/2018   two 3-5 mm cecal polyps, pancolonic diverticulosis. tubular adenoma. no surveillance due to age   INSERTION OF MESH N/A 01/27/2013   Procedure: INSERTION OF MESH;  Surgeon: Oneil DELENA Budge, MD;  Location: [facility];  Service: General;  Laterality: N/A;   NASAL SEPTOPLASTY W/ TURBINOPLASTY Bilateral 08/24/2023   Procedure: SEPTOPLASTY, NOSE, WITH NASAL TURBINATE REDUCTION;  Surgeon: Karis Clunes, MD;  Location: [facility] OR;  Service: ENT;  Laterality: Bilateral;   POLYPECTOMY  02/06/2018   Procedure: POLYPECTOMY;  Surgeon: Shaaron Lamar HERO, MD;  [facility];  Service: Endoscopy;;  (colon)   TONSILLECTOMY     as a child   UMBILICAL HERNIA REPAIR N/A 01/27/2013   Procedure: HERNIA REPAIR UMBILICAL ADULT;  Surgeon: Oneil DELENA Budge, MD;  Location: [facility];  Service: General;  Laterality: N/A;   Social History   Socioeconomic History   Marital status: Married    Spouse name: Not on file   Number of children: Not on file   Years of education:  Not on file   Highest education level: Not on file  Occupational History   Occupation: Retired    Associate Professor: [employer]    Comment: Arboriculturist  Tobacco Use   Smoking status: Never   Smokeless tobacco: Never  Vaping Use   Vaping status: Never Used  Substance and Sexual Activity   Alcohol use: No    Alcohol/week: 0.0 standard drinks of alcohol   Drug use: Never   Sexual activity: Yes    Partners: Female    Comment: married  Other Topics Concern   Not on file  Social History Narrative   Not on file   Social Drivers of Health   Financial Resource Strain: Not on file  Food Insecurity: No Food Insecurity (11/29/2023)   Hunger Vital Sign    Worried About Running Out of Food in the Last  Year: Never true    Ran Out of Food in the Last Year: Never true  Transportation Needs: Not on file  Physical Activity: Not on file  Stress: Not on file  Social Connections: Not on file   Family History  Problem Relation Age of Onset   Heart disease Father    Heart disease Mother    Bronchitis Mother    Allergies Other        whole family   Colon cancer Neg Hx    No Known Allergies Prior to Admission medications   Medication Sig Start Date End Date Taking? Authorizing Provider  acetaminophen  (TYLENOL ) 500 MG tablet Take 500 mg by mouth every 6 (six) hours as needed (for pain.).   Yes [provider]  calcium  carbonate (TUMS - DOSED IN MG ELEMENTAL CALCIUM ) 500 MG chewable tablet Chew 1 tablet by mouth as needed for indigestion or heartburn.   Yes [provider]  chlorthalidone  (HYGROTON ) 25 MG tablet Take 12.5 mg by mouth every other day.   Yes [provider]  diltiazem  (CARDIZEM ) 30 MG tablet Take 1 tablet (30 mg total) by mouth daily as needed (Palpations). 01/01/18  Yes Charls Pearla LABOR, MD  DM-APAP-CPM (CORICIDIN HBP) 10-325-2 MG TABS Take 1 tablet by mouth 2 (two) times daily as needed (cold symptoms).   Yes [provider]  doxazosin  (CARDURA ) 4 MG tablet TAKE THREE TABLETS BY MOUTH EVERY EVENING 06/28/23  Yes Mallipeddi, Vishnu P, MD  furosemide  (LASIX ) 20 MG tablet Take 20 mg by mouth daily as needed for fluid or edema.   Yes [provider]  glimepiride (AMARYL) 1 MG tablet Take 1 mg by mouth daily. 12/09/18  Yes [provider]  hydrALAZINE  (APRESOLINE ) 100 MG tablet Take 1 tablet (100 mg total) by mouth 3 (three) times daily. 09/04/22  Yes Mallipeddi, Vishnu P, MD  Hydrocortisone (PREPARATION H EX) Place 1 application rectally daily as needed (irritation/discomfort).    Yes [provider]  lisinopril  (ZESTRIL ) 20 MG tablet TAKE (1) TABLET BY MOUTH ONCE DAILY. 06/28/23  Yes Mallipeddi, Vishnu P, MD  Magnesium  250  MG TABS Take 125 mg by mouth at bedtime.   Yes [provider]  metFORMIN  (GLUCOPHAGE ) 1000 MG tablet Take 1,000 mg by mouth 2 (two) times daily. 12/09/18  Yes [provider]  metoprolol  tartrate (LOPRESSOR ) 50 MG tablet Take 50 mg by mouth 2 (two) times daily.   Yes [provider]  Multiple Vitamin (MULTIVITAMIN) tablet Take 1 tablet by mouth daily.   Yes [provider]  pravastatin  (PRAVACHOL ) 20 MG tablet Take 1 tablet (20 mg total) by  mouth every evening. 06/28/23  Yes Mallipeddi, Vishnu P, MD  warfarin (COUMADIN ) 5 MG tablet TAKE 1 TABLET BY MOUTH ONCE DAILY, EXCEPT TAKE 1 AND 1/2 TABLETS ON SATURDAYS OR AS DIRECTED. 12/03/23  Yes Mallipeddi, Vishnu P, MD  linaclotide  (LINZESS ) 290 MCG CAPS capsule Take 1 capsule (290 mcg total) by mouth every morning. 12/20/23   Shirlean Therisa ORN, NP    ROS: All other systems have been reviewed and were otherwise negative with the exception of those mentioned in the HPI and as above.  Physical Exam: General: Alert, no acute distress Cardiovascular: No pedal edema Respiratory: No cyanosis, no use of accessory musculature GI: No organomegaly, abdomen is soft and non-tender Skin: No lesions in the area of chief complaint Neurologic: Sensation intact distally Psychiatric: Patient is competent for consent with normal mood and affect Lymphatic: No axillary or cervical lymphadenopathy  MUSCULOSKELETAL:  Examination of the right shoulder, active forward elevation to 80 degrees, passive to 100 degrees, external rotation to 10 degrees, internal rotation to the back pocket.  He has weakness with all cuff strength testing.  Axillary nerve appears to be intact.  Imaging: Three views of the right shoulder demonstrate end-stage glenohumeral osteoarthritis, proximal migration of the humeral head, and a large osteophyte on the inferior aspect of the humeral head.  BMI: Estimated body mass index is 32.09 kg/m as calculated from the  following:   Height as of 12/20/23: 6' (1.829 m).   Weight as of 12/20/23: 107.3 kg.  Lab Results  Component Value Date   ALBUMIN 4.1 11/29/2023   Diabetes:   Patient has a diagnosis of diabetes,  Lab Results  Component Value Date   HGBA1C 6.2 (H) 11/29/2023   Smoking Status:   reports that he has never smoked. He has never used smokeless tobacco.     Assessment: osteoarthritis of right shoulder  Plan: Plan for Procedure(s): ARTHROPLASTY, SHOULDER, TOTAL, REVERSE  The risks benefits and alternatives were discussed with the patient including but not limited to the risks of nonoperative treatment, versus surgical intervention including infection, bleeding, nerve injury,  blood clots, cardiopulmonary complications, morbidity, mortality, among others, and they were willing to proceed.   We additionally specifically discussed risks of axillary nerve injury, infection, periprosthetic fracture, continued pain and longevity of implants prior to beginning procedure.    Patient will be closely monitored in PACU for medical stabilization and pain control. If found stable in PACU, patient may be discharged home with outpatient follow-up. If any concerns regarding patient's stabilization patient will be admitted for observation after surgery. The patient is planning to be discharged home with outpatient PT.   The patient acknowledged the explanation, agreed to proceed with the plan and consent was signed.   Operative Plan: right reverse total shoulder arthroplasty Discharge Medications: standard DVT Prophylaxis: resume warfarin Physical Therapy: outpatient PT Special Discharge needs: Sling (should bring with him). IceMan   Aleck LOISE Stalling, PA-C  12/25/2023 4:11 PM

## 2023-12-26 ENCOUNTER — Encounter (HOSPITAL_COMMUNITY): Payer: Self-pay | Admitting: Orthopaedic Surgery

## 2023-12-26 ENCOUNTER — Ambulatory Visit (HOSPITAL_COMMUNITY)
Admission: RE | Admit: 2023-12-26 | Discharge: 2023-12-26 | Disposition: A | Source: Ambulatory Visit | Attending: Orthopaedic Surgery | Admitting: Orthopaedic Surgery

## 2023-12-26 ENCOUNTER — Ambulatory Visit (HOSPITAL_COMMUNITY): Payer: Self-pay | Admitting: Physician Assistant

## 2023-12-26 ENCOUNTER — Encounter (HOSPITAL_COMMUNITY)
Admission: RE | Disposition: A | Discharge status: Home / Self Care | Payer: Self-pay | Source: Ambulatory Visit | Attending: Orthopaedic Surgery

## 2023-12-26 ENCOUNTER — Ambulatory Visit (HOSPITAL_COMMUNITY)

## 2023-12-26 ENCOUNTER — Ambulatory Visit (HOSPITAL_BASED_OUTPATIENT_CLINIC_OR_DEPARTMENT_OTHER): Admitting: Anesthesiology

## 2023-12-26 DIAGNOSIS — G4730 Sleep apnea, unspecified: Secondary | ICD-10-CM | POA: Diagnosis not present

## 2023-12-26 DIAGNOSIS — G8929 Other chronic pain: Secondary | ICD-10-CM | POA: Diagnosis not present

## 2023-12-26 DIAGNOSIS — I10 Essential (primary) hypertension: Secondary | ICD-10-CM

## 2023-12-26 DIAGNOSIS — E785 Hyperlipidemia, unspecified: Secondary | ICD-10-CM | POA: Diagnosis not present

## 2023-12-26 DIAGNOSIS — I4510 Unspecified right bundle-branch block: Secondary | ICD-10-CM | POA: Diagnosis not present

## 2023-12-26 DIAGNOSIS — E119 Type 2 diabetes mellitus without complications: Secondary | ICD-10-CM | POA: Diagnosis not present

## 2023-12-26 DIAGNOSIS — I4891 Unspecified atrial fibrillation: Secondary | ICD-10-CM | POA: Diagnosis not present

## 2023-12-26 DIAGNOSIS — Z96611 Presence of right artificial shoulder joint: Secondary | ICD-10-CM | POA: Insufficient documentation

## 2023-12-26 DIAGNOSIS — M549 Dorsalgia, unspecified: Secondary | ICD-10-CM | POA: Insufficient documentation

## 2023-12-26 DIAGNOSIS — Z7984 Long term (current) use of oral hypoglycemic drugs: Secondary | ICD-10-CM | POA: Diagnosis not present

## 2023-12-26 DIAGNOSIS — M19011 Primary osteoarthritis, right shoulder: Secondary | ICD-10-CM

## 2023-12-26 DIAGNOSIS — I2510 Atherosclerotic heart disease of native coronary artery without angina pectoris: Secondary | ICD-10-CM | POA: Diagnosis not present

## 2023-12-26 DIAGNOSIS — I480 Paroxysmal atrial fibrillation: Secondary | ICD-10-CM

## 2023-12-26 HISTORY — PX: REVERSE SHOULDER ARTHROPLASTY: SHX5054

## 2023-12-26 LAB — GLUCOSE, CAPILLARY
Glucose-Capillary: 113 mg/dL — ABNORMAL HIGH (ref 70–99)
Glucose-Capillary: 158 mg/dL — ABNORMAL HIGH (ref 70–99)

## 2023-12-26 LAB — PROTIME-INR
INR: 1.1 (ref 0.8–1.2)
Prothrombin Time: 14.5 s (ref 11.4–15.2)

## 2023-12-26 SURGERY — ARTHROPLASTY, SHOULDER, TOTAL, REVERSE
Anesthesia: General | Site: Shoulder | Laterality: Right

## 2023-12-26 MED ORDER — BUPIVACAINE LIPOSOME 1.3 % IJ SUSP
INTRAMUSCULAR | Status: DC | PRN
  Administered 2023-12-26: 10 mL via PERINEURAL

## 2023-12-26 MED ORDER — ALBUMIN HUMAN 5 % IV SOLN: INTRAVENOUS | Status: DC | PRN

## 2023-12-26 MED ORDER — CEFAZOLIN SODIUM-DEXTROSE 2-4 GM/100ML-% IV SOLN
2.0000 g | INTRAVENOUS | Status: AC
  Administered 2023-12-26: 2 g via INTRAVENOUS
  Filled 2023-12-26: qty 100

## 2023-12-26 MED ORDER — ORAL CARE MOUTH RINSE: 15.0000 mL | Freq: Once | OROMUCOSAL | Status: AC

## 2023-12-26 MED ORDER — PROPOFOL 10 MG/ML IV BOLUS
INTRAVENOUS | Status: DC | PRN
  Administered 2023-12-26: 150 mg via INTRAVENOUS

## 2023-12-26 MED ORDER — VANCOMYCIN HCL 1000 MG IV SOLR
INTRAVENOUS | Status: AC
  Filled 2023-12-26: qty 20

## 2023-12-26 MED ORDER — 0.9 % SODIUM CHLORIDE (POUR BTL) OPTIME
TOPICAL | Status: DC | PRN
  Administered 2023-12-26: 1000 mL

## 2023-12-26 MED ORDER — DEXAMETHASONE SOD PHOSPHATE PF 10 MG/ML IJ SOLN
INTRAMUSCULAR | Status: DC | PRN
  Administered 2023-12-26: 8 mg via INTRAVENOUS

## 2023-12-26 MED ORDER — CHLORHEXIDINE GLUCONATE 0.12 % MT SOLN
15.0000 mL | Freq: Once | OROMUCOSAL | Status: AC
  Administered 2023-12-26: 15 mL via OROMUCOSAL

## 2023-12-26 MED ORDER — MIDAZOLAM HCL (PF) 2 MG/2ML IJ SOLN: 1.0000 mg | Freq: Once | INTRAMUSCULAR | Status: DC

## 2023-12-26 MED ORDER — ONDANSETRON HCL 4 MG/2ML IJ SOLN
INTRAMUSCULAR | Status: DC | PRN
  Administered 2023-12-26: 4 mg via INTRAVENOUS

## 2023-12-26 MED ORDER — SODIUM CHLORIDE 0.9 % IR SOLN
Status: DC | PRN
  Administered 2023-12-26: 1000 mL

## 2023-12-26 MED ORDER — EPHEDRINE SULFATE (PRESSORS) 25 MG/5ML IV SOSY
PREFILLED_SYRINGE | INTRAVENOUS | Status: DC | PRN
  Administered 2023-12-26 (×2): 5 mg via INTRAVENOUS
  Administered 2023-12-26 (×2): 10 mg via INTRAVENOUS

## 2023-12-26 MED ORDER — ACETAMINOPHEN 500 MG PO TABS
1000.0000 mg | ORAL_TABLET | Freq: Once | ORAL | Status: AC
  Administered 2023-12-26: 1000 mg via ORAL
  Filled 2023-12-26: qty 2

## 2023-12-26 MED ORDER — VASOPRESSIN 20 UNIT/ML IV SOLN
INTRAVENOUS | Status: DC | PRN
  Administered 2023-12-26 (×5): 1 [IU] via INTRAVENOUS

## 2023-12-26 MED ORDER — ACETAMINOPHEN 500 MG PO TABS: 1000.0000 mg | ORAL_TABLET | Freq: Three times a day (TID) | ORAL | 0 refills | Status: AC

## 2023-12-26 MED ORDER — INSULIN ASPART 100 UNIT/ML IJ SOLN: 0.0000 [IU] | INTRAMUSCULAR | Status: DC | PRN

## 2023-12-26 MED ORDER — ROCURONIUM BROMIDE 100 MG/10ML IV SOLN
INTRAVENOUS | Status: DC | PRN
  Administered 2023-12-26: 60 mg via INTRAVENOUS

## 2023-12-26 MED ORDER — ONDANSETRON HCL 4 MG PO TABS: 4.0000 mg | ORAL_TABLET | Freq: Three times a day (TID) | ORAL | 0 refills | Status: AC | PRN

## 2023-12-26 MED ORDER — FENTANYL CITRATE (PF) 50 MCG/ML IJ SOSY
50.0000 ug | PREFILLED_SYRINGE | Freq: Once | INTRAMUSCULAR | Status: AC
Start: 2023-12-26 — End: 2023-12-26
  Administered 2023-12-26: 50 ug via INTRAVENOUS
  Filled 2023-12-26: qty 2

## 2023-12-26 MED ORDER — VANCOMYCIN HCL 1 G IV SOLR
INTRAVENOUS | Status: DC | PRN
  Administered 2023-12-26: 1000 mg via TOPICAL

## 2023-12-26 MED ORDER — STERILE WATER FOR IRRIGATION IR SOLN
Status: DC | PRN
  Administered 2023-12-26: 1000 mL

## 2023-12-26 MED ORDER — BUPIVACAINE HCL (PF) 0.5 % IJ SOLN
INTRAMUSCULAR | Status: DC | PRN
  Administered 2023-12-26: 10 mL via PERINEURAL

## 2023-12-26 MED ORDER — LACTATED RINGERS IV SOLN: INTRAVENOUS | Status: DC

## 2023-12-26 MED ORDER — PHENYLEPHRINE HCL-NACL 20-0.9 MG/250ML-% IV SOLN
INTRAVENOUS | Status: DC | PRN
  Administered 2023-12-26: 25 ug/min via INTRAVENOUS

## 2023-12-26 MED ORDER — OXYCODONE HCL 5 MG/5ML PO SOLN: 5.0000 mg | Freq: Once | ORAL | Status: DC | PRN

## 2023-12-26 MED ORDER — TRANEXAMIC ACID-NACL 1000-0.7 MG/100ML-% IV SOLN
1000.0000 mg | INTRAVENOUS | Status: AC
  Administered 2023-12-26: 1000 mg via INTRAVENOUS
  Filled 2023-12-26: qty 100

## 2023-12-26 MED ORDER — AMISULPRIDE (ANTIEMETIC) 5 MG/2ML IV SOLN: 10.0000 mg | Freq: Once | INTRAVENOUS | Status: DC | PRN

## 2023-12-26 MED ORDER — LIDOCAINE HCL (CARDIAC) PF 100 MG/5ML IV SOSY
PREFILLED_SYRINGE | INTRAVENOUS | Status: DC | PRN
  Administered 2023-12-26: 60 mg via INTRAVENOUS

## 2023-12-26 MED ORDER — OXYCODONE HCL 5 MG PO TABS: 5.0000 mg | ORAL_TABLET | Freq: Once | ORAL | Status: DC | PRN

## 2023-12-26 MED ORDER — SUGAMMADEX SODIUM 200 MG/2ML IV SOLN
INTRAVENOUS | Status: DC | PRN
  Administered 2023-12-26: 250 mg via INTRAVENOUS

## 2023-12-26 MED ORDER — PROPOFOL 10 MG/ML IV BOLUS
INTRAVENOUS | Status: AC
  Filled 2023-12-26: qty 20

## 2023-12-26 MED ORDER — FENTANYL CITRATE (PF) 50 MCG/ML IJ SOSY: 25.0000 ug | PREFILLED_SYRINGE | INTRAMUSCULAR | Status: DC | PRN

## 2023-12-26 MED ORDER — LACTATED RINGERS IV SOLN
INTRAVENOUS | Status: DC | PRN
Start: 2023-12-26 — End: 2023-12-26

## 2023-12-26 MED ORDER — PHENYLEPHRINE HCL (PRESSORS) 10 MG/ML IV SOLN
INTRAVENOUS | Status: DC | PRN
  Administered 2023-12-26 (×4): 80 ug via INTRAVENOUS

## 2023-12-26 MED ORDER — VASOPRESSIN 20 UNIT/ML IV SOLN
INTRAVENOUS | Status: AC
  Filled 2023-12-26: qty 1

## 2023-12-26 SURGICAL SUPPLY — 55 items
BAG COUNTER SPONGE SURGICOUNT (BAG) ×1 IMPLANT
BASEPLATE HALF WEDGE 35X29 (Plate) IMPLANT
BIT DRILL 3.2 PERIPHERAL SCREW (BIT) IMPLANT
BLADE SAW SGTL 73X25 THK (BLADE) ×1 IMPLANT
CHLORAPREP W/TINT 26 (MISCELLANEOUS) ×2 IMPLANT
CLSR STERI-STRIP ANTIMIC 1/2X4 (GAUZE/BANDAGES/DRESSINGS) ×1 IMPLANT
COOLER ICEMAN CLASSIC (MISCELLANEOUS) ×1 IMPLANT
COVER BACK TABLE 60X90IN (DRAPES) ×1 IMPLANT
COVER SURGICAL LIGHT HANDLE (MISCELLANEOUS) ×1 IMPLANT
CUP HUM REV SHLD 3/4 39 +3 (Cup) IMPLANT
DRAPE 3/4 80X56 (DRAPES) ×1 IMPLANT
DRAPE C-ARM 42X120 X-RAY (DRAPES) IMPLANT
DRAPE INCISE IOBAN 66X45 STRL (DRAPES) ×1 IMPLANT
DRAPE POUCH INSTRU U-SHP 10X18 (DRAPES) ×1 IMPLANT
DRAPE SHEET LG 3/4 BI-LAMINATE (DRAPES) ×2 IMPLANT
DRAPE SURG ORHT 6 SPLT 77X108 (DRAPES) ×2 IMPLANT
DRAPE TOP 10253 STERILE (DRAPES) ×1 IMPLANT
DRSG AQUACEL AG ADV 3.5X 6 (GAUZE/BANDAGES/DRESSINGS) ×1 IMPLANT
ELECT BLADE TIP CTD 4 INCH (ELECTRODE) ×1 IMPLANT
ELECT PENCIL ROCKER SW 15FT (MISCELLANEOUS) ×1 IMPLANT
ELECT REM PT RETURN 15FT ADLT (MISCELLANEOUS) ×1 IMPLANT
FACESHIELD WRAPAROUND OR TEAM (MASK) ×3 IMPLANT
GLOVE BIO SURGEON STRL SZ 6.5 (GLOVE) ×2 IMPLANT
GLOVE BIOGEL PI IND STRL 6.5 (GLOVE) ×1 IMPLANT
GLOVE BIOGEL PI IND STRL 8 (GLOVE) ×1 IMPLANT
GLOVE ECLIPSE 8.0 STRL XLNG CF (GLOVE) ×2 IMPLANT
GOWN STRL REUS W/ TWL LRG LVL3 (GOWN DISPOSABLE) ×2 IMPLANT
GUIDEWIRE GLENOID 2.5X220 (WIRE) IMPLANT
KIT BASIN OR (CUSTOM PROCEDURE TRAY) ×1 IMPLANT
KIT STABILIZATION SHOULDER (MISCELLANEOUS) ×1 IMPLANT
KIT TURNOVER KIT A (KITS) ×1 IMPLANT
MANIFOLD NEPTUNE II (INSTRUMENTS) ×1 IMPLANT
NS IRRIG 1000ML POUR BTL (IV SOLUTION) ×1 IMPLANT
PACK SHOULDER (CUSTOM PROCEDURE TRAY) ×1 IMPLANT
PAD COLD SHLDR WRAP-ON (PAD) ×1 IMPLANT
PIN GUIDE 3X75 SHOULDER (PIN) IMPLANT
RESTRAINT HEAD UNIVERSAL NS (MISCELLANEOUS) ×1 IMPLANT
SCREW 5.0X18 (Screw) IMPLANT
SCREW 5.5X22 (Screw) IMPLANT
SCREW 5.5X26 (Screw) IMPLANT
SCREW BONE 6.5 OD 30 NON BIO (Screw) IMPLANT
SET HNDPC FAN SPRY TIP SCT (DISPOSABLE) ×1 IMPLANT
SPHERE GLENOD +3X39XLATERALIZE (Shoulder) IMPLANT
SPONGE T-LAP 4X18 ~~LOC~~+RFID (SPONGE) IMPLANT
STEM HUM PERFORM 4 (Stem) IMPLANT
SUCTION TUBE FRAZIER 12FR DISP (SUCTIONS) IMPLANT
SUT ETHIBOND 2 V 37 (SUTURE) ×1 IMPLANT
SUT ETHIBOND NAB CT1 #1 30IN (SUTURE) ×1 IMPLANT
SUT MNCRL AB 4-0 PS2 18 (SUTURE) ×1 IMPLANT
SUT VIC AB 0 CT1 36 (SUTURE) IMPLANT
SUT VIC AB 3-0 SH 27X BRD (SUTURE) ×1 IMPLANT
SUTURE FIBERWR #5 38 CONV NDL (SUTURE) ×2 IMPLANT
TOWEL OR 17X26 10 PK STRL BLUE (TOWEL DISPOSABLE) ×1 IMPLANT
TUBE SUCTION HIGH CAP CLEAR NV (SUCTIONS) ×1 IMPLANT
WATER STERILE IRR 1000ML POUR (IV SOLUTION) ×2 IMPLANT

## 2023-12-26 NOTE — Discharge Instructions (Signed)
 Bonner Hair MD, MPH Aleck Stalling, PA-C [facility] [address] [phone] (tel)   [phone] (fax)   POST-OPERATIVE INSTRUCTIONS - TOTAL SHOULDER REPLACEMENT    WOUND CARE You may leave the operative dressing in place until your follow-up appointment. KEEP THE INCISIONS CLEAN AND DRY. There may be a small amount of fluid/bleeding leaking at the surgical site. This is normal after surgery.  If it fills with liquid or blood please call us  immediately to change it for you. Use the provided ice machine or Ice packs as often as possible for the first 3-4 days, then as needed for pain relief.   Keep a layer of cloth or a shirt between your skin and the cooling unit to prevent frost bite as it can get very cold.  SHOWERING: - You may shower on Post-Op Day #2.  - The dressing is water  resistant but do not scrub it as it may start to peel up.   - You may remove the sling for showering - Gently pat the area dry.  - Do not soak the shoulder in water .  - Do not go swimming in the pool or ocean until your incision has completely healed (about 4-6 weeks after surgery) - KEEP THE INCISIONS CLEAN AND DRY.  EXERCISES Wear the sling at all times  You may remove the sling for showering, but keep the arm across the chest or in a secondary sling.    Accidental/Purposeful External Rotation and shoulder flexion (reaching behind you) is to be avoided at all costs for the first month. It is ok to come out of your sling if your are sitting and have assistance for eating.   Do not lift anything heavier than 1 pound until we discuss it further in clinic.  It is normal for your fingers/hand to become more swollen after surgery and discolored from bruising.   This will resolve over the first few weeks usually after surgery. Please continue to ambulate and do not stay sitting or lying for too long.  Perform foot and wrist pumps to assist in circulation.  PHYSICAL  THERAPY - You will begin physical therapy soon after surgery (unless otherwise specified) - Please call to set up an appointment, if you do not already have one  - Let our office if there are any issues with scheduling your therapy   - A PT referral was sent to [facility]   REGIONAL ANESTHESIA (NERVE BLOCKS) The anesthesia team may have performed a nerve block for you this is a great tool used to minimize pain.   The block may start wearing off overnight (between 8-24 hours postop) When the block wears off, your pain may go from nearly zero to the pain you would have had postop without the block. This is an abrupt transition but nothing dangerous is happening.   This can be a challenging period but utilize your as needed pain medications to try and manage this period. We suggest you use the pain medication the first night prior to going to bed, to ease this transition.  You may take an extra dose of narcotic when this happens if needed   POST-OP MEDICATIONS- Multimodal approach to pain control In general your pain will be controlled with a combination of substances.  Prescriptions unless otherwise discussed are electronically sent to your pharmacy.  This is a carefully made plan we use to minimize narcotic use.     Acetaminophen  - Non-narcotic pain medicine taken on a scheduled  basis  Oxycodone  - This is a strong narcotic, to be used only on an "as needed" basis for SEVERE pain. Zofran  -  take as needed for nausea   FOLLOW-UP If you develop a Fever (>101.5), Redness or Drainage from the surgical incision site, please call our office to arrange for an evaluation. Please call the office to schedule a follow-up appointment for a wound check, 7-10 days post-operatively.  IF YOU HAVE ANY QUESTIONS, PLEASE FEEL FREE TO CALL OUR OFFICE.  HELPFUL INFORMATION  Your arm will be in a sling following surgery. You will be in this sling for the next 4 weeks.   You may be more comfortable sleeping in  a semi-seated position the first few nights following surgery.  Keep a pillow propped under the elbow and forearm for comfort.  If you have a recliner type of chair it might be beneficial.  If not that is fine too, but it would be helpful to sleep propped up with pillows behind your operated shoulder as well under your elbow and forearm.  This will reduce pulling on the suture lines.  When dressing, put your operative arm in the sleeve first.  When getting undressed, take your operative arm out last.  Loose fitting, button-down shirts are recommended.  In most states it is against the law to drive while your arm is in a sling. And certainly against the law to drive while taking narcotics.  You may return to work/school in the next couple of days when you feel up to it. Desk work and typing in the sling is fine.  We suggest you use the pain medication the first night prior to going to bed, in order to ease any pain when the anesthesia wears off. You should avoid taking pain medications on an empty stomach as it will make you nauseous.  You should wean off your narcotic medicines as soon as you are able.     Most patients will be off narcotics before their first postop appointment.   Do not drink alcoholic beverages or take illicit drugs when taking pain medications.  Pain medication may make you constipated.  Below are a few solutions to try in this order: Decrease the amount of pain medication if you aren't having pain. Drink lots of decaffeinated fluids. Drink prune juice and/or each dried prunes  If the first 3 don't work start with additional solutions Take Colace - an over-the-counter stool softener Take Senokot - an over-the-counter laxative Take Miralax  - a stronger over-the-counter laxative   Dental Antibiotics:  We require dental prophylaxis for 2 years after a shoulder replacement  Contact your surgeon for an antibiotic prescription, prior to your dental procedure.   For  more information including helpful videos and documents visit our website:   [email or web address]

## 2023-12-26 NOTE — Op Note (Signed)
 Orthopaedic Surgery Operative Note (CSN: [id number])  Riley [name]  01/27/1946 Date of Surgery: 12/26/2023   Diagnoses:  osteoarthritis of right shoulder  Procedure: Right reverse half wedge  Total Shoulder Arthroplasty   Operative Finding Successful completion of planned procedure.  Patient had severe posterior erosion of his glenoid and large Inferior osteophytes on the humeral head.  I was able to obtain good fixation with a half wedge implant.  Tug test was normal.  The patient had some shortness of breath after his block and we will continue to monitor him in PACU and plan to discharge if he is comfortable.  Post-operative plan: The patient will be NWB in sling.  The patient will be will be discharged from PACU if continues to be stable as was plan prior to surgery.  DVT prophylaxis not indicated as patient already on full dose anticoagulant.  Pain control with PRN pain medication preferring oral medicines.  Follow up plan will be scheduled in approximately 7 days for incision check and XR.  Physical therapy to start immediately.  Implants: Tornier perform humeral size 4 stem, +3 polyethylene, 39+3 glenosphere with a 29 half wedge baseplate with a 40 center screw and 4 peripheral screws  Post-Op Diagnosis: Same Surgeons:Primary: Riley Bonner ONEIDA, MD Assistants:Caroline McBane, PA-C Location: [facility] ROOM 06 Anesthesia: General with Exparel  Interscalene Antibiotics: Ancef  2g preop, Vancomycin 1000mg  locally Tourniquet time: None Estimated Blood Loss: 100 Complications: None Specimens: None Implants: Implant Name Type Inv. Item Serial No. Manufacturer Lot No. LRB No. Used Action  BASEPLATE HALF WEDGE 35X29 - [id number] Plate BASEPLATE HALF WEDGE 35X29  [employer] [id number] Right 1 Implanted  SPHERE GLENOD +3X39XLATERALIZE - [id number] Shoulder SPHERE GLENOD +3X39XLATERALIZE  [employer] [id number] Right 1 Implanted  SCREW BONE 6.5 OD 30 NON BIO - [id number] Screw SCREW BONE 6.5 OD 30  NON BIO  [employer]  Right 1 Implanted  SCREW 5.5X26 - [id number] Screw SCREW 5.5X26  [employer]  Right 1 Implanted  SCREW 5.0X18 - [id number] Screw SCREW 5.0X18  [employer]  Right 2 Implanted  SCREW 5.5X22 - [id number] Screw SCREW 5.5X22  [employer]  Right 1 Implanted  CUP HUM REV SHLD 3/4 39 +3 - [id number] Cup CUP HUM REV SHLD 3/4 39 +3  [employer] [id number] Right 1 Implanted  STEM HUM PERFORM 4 - [id number] Stem STEM HUM PERFORM 4  [employer] [id number] Right 1 Implanted    Indications for Surgery:   Riley [name] is a 78 y.o. 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  Based on his preoperative measurements I felt reverse was a more predictable option for him than an anatomic total arthroplasty.  Benefits and risks of operative and nonoperative management were discussed prior to surgery with patient/guardian(s) and informed consent form was completed.  Infection and need for further surgery were discussed as was prosthetic stability and cuff issues.  We additionally specifically discussed risks of axillary nerve injury, infection, periprosthetic fracture, continued pain and longevity of implants prior to beginning procedure.      Procedure:   The patient was identified in the preoperative holding area where the surgical site was marked. Block placed by anesthesia with exparel .  The patient was taken to the OR where a procedural timeout was called and the above noted anesthesia was induced.  The patient was positioned beachchair on allen table with spider arm positioner.  Preoperative antibiotics were dosed.  The patient's right shoulder was prepped and draped in the  usual sterile fashion.  A second preoperative timeout was called.       Standard deltopectoral approach was performed with a #10 blade. We dissected down to the subcutaneous tissues and the cephalic vein was taken laterally with the deltoid. Clavipectoral fascia was incised in line with the  incision. Deep retractors were placed. The long of the biceps tendon was identified and there was significant tenosynovitis present.  Tenodesis was performed to the pectoralis tendon with #2 Ethibond. The remaining biceps was followed up into the rotator interval where it was released.   The subscapularis was taken down in a full thickness layer with capsule along the humeral neck extending inferiorly around the humeral head. We continued releasing the capsule directly off of the osteophytes inferiorly all the way around the corner. This allowed us  to dislocate the humeral head.   The humeral head had evidence of severe osteoarthritic wear with full-thickness cartilage loss and exposed subchondral bone. There was significant flattening of the humeral head.   The rotator cuff was intact but considering the significant erosion of the posterior aspect of the glenoid I did not feel that anatomic total shoulder arthroplasty was appropriate and considered to reverse instead.  There were osteophytes along the inferior humeral neck. The osteophytes were removed with an osteotome and a rongeur.  Osteophytes were removed with a rongeur and an osteotome and the anatomic neck was well visualized.     A humeral cutting guide was used extra medullary with a pin to help control version. The version was set at 20 of retroversion. Humeral osteotomy was performed with an oscillating saw. The head fragment was passed off the back table.  A cut protector plate was placed.  The subscapularis was again identified and immediately we took care to palpate the axillary nerve anteriorly and verify its position with gentle palpation as well as the tug test.  We then released the SGHL with bovie cautery prior to placing a curved mayo at the junction of the anterior glenoid well above the axillary nerve and bluntly dissecting the subscapularis from the capsule.  We then carefully protected the axillary nerve as we gently released  the inferior capsule to fully mobilize the subscapularis.  An anterior deltoid retractor was then placed as well as a small Hohmann retractor superiorly.   The glenoid was inspected and had evidence of severe osteoarthritic wear with full-thickness cartilage loss and exposed subchondral bone.   The remaining labrum was removed circumferentially taking great care not to disrupt the posterior capsule.   At this point we felt based on blueprint templating that a 1/2 wedge augment was necessary.  We began by reviewing the native glenoid and then reaming the neo-- glenoid with the offset reamer.  Once we had appropriate witness marks we proceeded with the rest of the case.  At this point we proceeded with our center drill and had an intact vault.  We then drilled our center screw to a length of 40 mm.    We selected a 6.5 mm x 40 mm screw and the full wedge baseplate which was placed in the same orientation as our reaming.  We double checked that we had good apposition of the base plate to bone and then proceeded to place 3 locking screws and one nonlocking screw as is typical.   Next a 39 +3 mm glenosphere was selected and impacted onto the baseplate. The center screw was tightened.  We turned attention back to the humeral side. The cut  protector was removed.  We used the perform humeral sizing block to select the appropriate size which for this patient was a 4.  We then placed our center pin and reamed over it concentrically obtaining appropriate inset.  We then used our lateralizing chisel to prepare the lateral aspect of the humerus.  At that point we selected the appropriate implant trialing a 4.  Using this trial implant we trialed multiple polyethylene sizes settling on a 3 which provided good stability and range of motion without excess soft tissue tension. The offset was dialed in to match the normal anatomy. The shoulder was trialed.  There was good ROM in all planes and the shoulder was stable with  no inferior translation.  The real humeral implants were opened after again confirming sizes.  The trial was removed. #5 Fiberwire x4 sutures passed through the humeral neck for subscap repair. The humeral component was press-fit obtaining a secure fit. The joint was reduced and thoroughly irrigated with pulsatile lavage. Subscap was repaired back with #5 Fiberwire sutures through bone tunnels. Hemostasis was obtained. The deltopectoral interval was reapproximated with #1 Ethibond. The subcutaneous tissues were closed with 2-0 Vicryl and the skin was closed with running nylon.    The wounds were cleaned and dried and an Aquacel dressing was placed. The drapes taken down. The arm was placed into sling with abduction pillow. Patient was awakened, extubated, and transferred to the recovery room in stable condition. There were no intraoperative complications. The sponge, needle, and attention counts were correct at the end of the case.     Aleck Stalling, PA-C, present and scrubbed throughout the case, critical for completion in a timely fashion, and for retraction, instrumentation, closure.

## 2023-12-26 NOTE — Anesthesia Postprocedure Evaluation (Signed)
 Anesthesia Post Note  Patient: Riley [name]  Procedure(s) Performed: ARTHROPLASTY, SHOULDER, TOTAL, REVERSE (Right: Shoulder)     Patient location during evaluation: PACU Anesthesia Type: General Level of consciousness: awake Pain management: pain level controlled Vital Signs Assessment: post-procedure vital signs reviewed and stable Respiratory status: spontaneous breathing, nonlabored ventilation and respiratory function stable Cardiovascular status: blood pressure returned to baseline and stable Postop Assessment: no apparent nausea or vomiting Anesthetic complications: no Comments: Patient instructed to use his CPAP when sleeping.   No notable events documented.  Last Vitals:  Vitals:   12/26/23 1430 12/26/23 1445  BP: 127/86 133/88  Pulse: 68 81  Resp: 12 15  Temp: 36.6 C 36.6 C  SpO2: 93% 92%    Last Pain:  Vitals:   12/26/23 1430  TempSrc:   PainSc: 0-No pain                 Delon Aisha Arch

## 2023-12-26 NOTE — Transfer of Care (Signed)
 Immediate Anesthesia Transfer of Care Note  Patient: Riley [name]  Procedure(s) Performed: ARTHROPLASTY, SHOULDER, TOTAL, REVERSE (Right: Shoulder)  Patient Location: PACU  Anesthesia Type:General and Spinal  Level of Consciousness: awake and alert   Airway & Oxygen Therapy: Patient Spontanous Breathing and Patient connected to nasal cannula oxygen  Post-op Assessment: Report given to RN and Post -op Vital signs reviewed and stable  Post vital signs: Reviewed and stable  Last Vitals:  Vitals Value Taken Time  BP 117/81 12/26/23 12:45  Temp    Pulse 81 12/26/23 12:45  Resp 26 12/26/23 12:45  SpO2 96 % 12/26/23 12:45  Vitals shown include unfiled device data.  Last Pain:  Vitals:   12/26/23 1050  TempSrc:   PainSc: 0-No pain         Complications: No notable events documented.

## 2023-12-26 NOTE — Anesthesia Procedure Notes (Signed)
 Procedure Name: Intubation Date/Time: 12/26/2023 11:07 AM  Performed by: Dartha Meckel, CRNAPre-anesthesia Checklist: Patient identified, Emergency Drugs available, Suction available and Patient being monitored Patient Re-evaluated:Patient Re-evaluated prior to induction Oxygen Delivery Method: Circle system utilized Preoxygenation: Pre-oxygenation with 100% oxygen Induction Type: IV induction Ventilation: Mask ventilation without difficulty Laryngoscope Size: Glidescope and 4 Grade View: Grade I Tube type: Oral Tube size: 7.5 mm Number of attempts: 1 Airway Equipment and Method: Stylet and Oral airway Placement Confirmation: ETT inserted through vocal cords under direct vision, positive ETCO2 and breath sounds checked- equal and bilateral Secured at: 23 cm Tube secured with: Tape Dental Injury: Teeth and Oropharynx as per pre-operative assessment

## 2023-12-26 NOTE — Interval H&P Note (Signed)
 All questions answered, patient wants to proceed with procedure. ? ?

## 2023-12-26 NOTE — Anesthesia Procedure Notes (Signed)
 Anesthesia Regional Block: Interscalene brachial plexus block   Pre-Anesthetic Checklist: , timeout performed,  Correct Patient, Correct Site, Correct Laterality,  Correct Procedure, Correct Position, site marked,  Risks and benefits discussed,  Surgical consent,  Pre-op evaluation,  At surgeon's request and post-op pain management  Laterality: Right  Prep: chloraprep       Needles:  Injection technique: Single-shot  Needle Type: Echogenic Stimulator Needle     Needle Length: 9cm  Needle Gauge: 21     Additional Needles:   Procedures:,,,, ultrasound used (permanent image in chart),,    Narrative:  Start time: 12/26/2023 10:42 AM End time: 12/26/2023 10:47 AM Injection made incrementally with aspirations every 5 mL.  Performed by: Personally  Anesthesiologist: Peggye Delon Brunswick, MD  Additional Notes: Discussed risks and benefits of nerve block including, but not limited to, prolonged and/or permanent nerve injury involving sensory and/or motor function. Monitors were applied and a time-out was performed. The nerve and associated structures were visualized under ultrasound guidance. After negative aspiration, local anesthetic was slowly injected around the nerve. There was no evidence of high pressure during the procedure. There were no paresthesias. VSS remained stable and the patient tolerated the procedure well.

## 2023-12-27 ENCOUNTER — Encounter (HOSPITAL_COMMUNITY): Payer: Self-pay | Admitting: Orthopaedic Surgery

## 2024-01-01 ENCOUNTER — Other Ambulatory Visit: Payer: Self-pay | Admitting: *Deleted

## 2024-01-01 MED ORDER — METFORMIN HCL 1000 MG PO TABS: 1000.0000 mg | ORAL_TABLET | Freq: Two times a day (BID) | ORAL | 0 refills | Status: DC

## 2024-01-01 MED ORDER — GLIMEPIRIDE 1 MG PO TABS: 1.0000 mg | ORAL_TABLET | Freq: Every day | ORAL | 0 refills | Status: DC

## 2024-01-02 ENCOUNTER — Telehealth: Payer: Self-pay | Admitting: Internal Medicine

## 2024-01-03 ENCOUNTER — Ambulatory Visit

## 2024-01-03 ENCOUNTER — Other Ambulatory Visit: Payer: Self-pay

## 2024-01-03 VITALS — BP 125/80 | HR 72 | Ht 73.0 in | Wt 236.0 lb

## 2024-01-03 DIAGNOSIS — Z0000 Encounter for general adult medical examination without abnormal findings: Secondary | ICD-10-CM

## 2024-01-03 NOTE — Patient Instructions (Signed)
 Mr. Blatz,  Thank you for taking the time for your Medicare Wellness Visit. I appreciate your continued commitment to your health goals. Please review the care plan we discussed, and feel free to reach out if I can assist you further.  Medicare recommends these wellness visits once per year to help you and your care team stay ahead of potential health issues. These visits are designed to focus on prevention, allowing your provider to concentrate on managing your acute and chronic conditions during your regular appointments.  Please note that Annual Wellness Visits do not include a physical exam. Some assessments may be limited, especially if the visit was conducted virtually. If needed, we may recommend a separate in-person follow-up with your provider.  Ongoing Care Seeing your primary care provider every 3 to 6 months helps us  monitor your health and provide consistent, personalized care.   Referrals If a referral was made during today's visit and you haven't received any updates within two weeks, please contact the referred provider directly to check on the status.  Recommended Screenings:  Health Maintenance  Topic Date Due   Medicare Annual Wellness Visit  Never done   DTaP/Tdap/Td vaccine (1 - Tdap) Never done   Zoster (Shingles) Vaccine (1 of 2) Never done   Pneumococcal Vaccine for age over 75 (2 of 2 - PCV) 05/01/2013   COVID-19 Vaccine (3 - 2025-26 season) 11/05/2023   Eye exam for diabetics  03/20/2024*   Flu Shot  06/03/2024*   Hepatitis C Screening  11/28/2024*   Hemoglobin A1C  05/28/2024   Yearly kidney health urinalysis for diabetes  11/28/2024   Complete foot exam   11/28/2024   Yearly kidney function blood test for diabetes  12/18/2024   Meningitis B Vaccine  Aged Out   Colon Cancer Screening  Discontinued  *Topic was postponed. The date shown is not the original due date.       01/03/2024   11:34 AM  Advanced Directives  Does Patient Have a Medical Advance  Directive? Yes  Type of Estate Agent of [place];Living will  Copy of Healthcare Power of Attorney in Chart? No - copy requested   Advance Care Planning is important because it: Ensures you receive medical care that aligns with your values, goals, and preferences. Provides guidance to your family and loved ones, reducing the emotional burden of decision-making during critical moments.  Vision: Annual vision screenings are recommended for early detection of glaucoma, cataracts, and diabetic retinopathy. These exams can also reveal signs of chronic conditions such as diabetes and high blood pressure.  Dental: Annual dental screenings help detect early signs of oral cancer, gum disease, and other conditions linked to overall health, including heart disease and diabetes.  Please see the attached documents for additional preventive care recommendations.

## 2024-01-03 NOTE — Progress Notes (Addendum)
 Subjective:   Riley [name] is a 78 y.o. who presents for a Medicare Wellness preventive visit.  As a reminder, Annual Wellness Visits don't include a physical exam, and some assessments may be limited, especially if this visit is performed virtually. We may recommend an in-person follow-up visit with your provider if needed.  Visit Complete: Virtual I connected with  Riley [name] on 01/03/24 by a audio enabled telemedicine application and verified that I am speaking with the correct person using two identifiers.  Patient Location: Home  Provider Location: Home Office  I discussed the limitations of evaluation and management by telemedicine. The patient expressed understanding and agreed to proceed.  Vital Signs: Because this visit was a virtual/telehealth visit, some criteria may be missing or patient reported. Any vitals not documented were not able to be obtained and vitals that have been documented are patient reported.  VideoDeclined- This patient declined Librarian, academic. Therefore the visit was completed with audio only.  Persons Participating in Visit: Patient.  AWV Questionnaire: No: Patient Medicare AWV questionnaire was not completed prior to this visit.  Cardiac Risk Factors include: advanced age (>48men, >35 women);diabetes mellitus;dyslipidemia;hypertension;male gender;obesity (BMI >30kg/m2);smoking/ tobacco exposure     Objective:    Today's Vitals   01/03/24 1123  BP: 125/80  Pulse: 72  Weight: 236 lb (107 kg)  Height: 6' 1 (1.854 m)   Body mass index is 31.14 kg/m.     01/03/2024   11:34 AM 12/19/2023    2:29 PM 08/24/2023   11:24 AM 08/17/2023    2:18 PM 06/07/2023   11:10 PM 02/06/2018   11:11 AM 02/11/2015    2:34 PM  Advanced Directives  Does Patient Have a Medical Advance Directive? Yes No No No;Yes No No  No   Type of Estate Agent of [place];Living will   Living will     Copy of Healthcare Power  of Attorney in Chart? No - copy requested        Would patient like information on creating a medical advance directive?     No - Patient declined No - Patient declined       Data saved with a previous flowsheet row definition    Current Medications (verified) Outpatient Encounter Medications as of 01/03/2024  Medication Sig   acetaminophen  (TYLENOL ) 500 MG tablet Take 2 tablets (1,000 mg total) by mouth every 8 (eight) hours for 14 days.   calcium  carbonate (TUMS - DOSED IN MG ELEMENTAL CALCIUM ) 500 MG chewable tablet Chew 1 tablet by mouth as needed for indigestion or heartburn.   chlorthalidone  (HYGROTON ) 25 MG tablet Take 12.5 mg by mouth every other day.   diltiazem  (CARDIZEM ) 30 MG tablet Take 1 tablet (30 mg total) by mouth daily as needed (Palpations).   DM-APAP-CPM (CORICIDIN HBP) 10-325-2 MG TABS Take 1 tablet by mouth 2 (two) times daily as needed (cold symptoms).   doxazosin  (CARDURA ) 4 MG tablet TAKE THREE TABLETS BY MOUTH EVERY EVENING   furosemide  (LASIX ) 20 MG tablet Take 20 mg by mouth daily as needed for fluid or edema.   glimepiride (AMARYL) 1 MG tablet Take 1 tablet (1 mg total) by mouth daily.   hydrALAZINE  (APRESOLINE ) 100 MG tablet Take 1 tablet (100 mg total) by mouth 3 (three) times daily.   Hydrocortisone (PREPARATION H EX) Place 1 application rectally daily as needed (irritation/discomfort).    linaclotide  (LINZESS ) 290 MCG CAPS capsule Take 1 capsule (290 mcg total) by  mouth every morning.   lisinopril  (ZESTRIL ) 20 MG tablet TAKE (1) TABLET BY MOUTH ONCE DAILY.   Magnesium  250 MG TABS Take 125 mg by mouth at bedtime.   metFORMIN  (GLUCOPHAGE ) 1000 MG tablet Take 1 tablet (1,000 mg total) by mouth 2 (two) times daily with a meal.   metoprolol  tartrate (LOPRESSOR ) 50 MG tablet Take 50 mg by mouth 2 (two) times daily.   Multiple Vitamin (MULTIVITAMIN) tablet Take 1 tablet by mouth daily.   pravastatin  (PRAVACHOL ) 20 MG tablet Take 1 tablet (20 mg total) by mouth every  evening.   warfarin (COUMADIN ) 5 MG tablet TAKE 1 TABLET BY MOUTH ONCE DAILY, EXCEPT TAKE 1 AND 1/2 TABLETS ON SATURDAYS OR AS DIRECTED.   No facility-administered encounter medications on file as of 01/03/2024.    Allergies (verified) Patient has no known allergies.   History: Past Medical History:  Diagnosis Date   Arthritis    Cancer (HCC)    skin   Chronic anticoagulation    Chronic back pain    Diabetes mellitus, type 2 (HCC)    no insulin    Dysrhythmia    Edema    GERD (gastroesophageal reflux disease)    Hyperlipidemia    Hypertension    normal coronary angiography and normal EF in 12/98   Nodule of left lung    Granuloma in left upper lobe   Paroxysmal atrial fibrillation (HCC)    12/2005; sinus bradycardia secondary to medications   Sleep apnea    Past Surgical History:  Procedure Laterality Date   APPENDECTOMY  1998   COLONOSCOPY N/A 11/26/2012   MFM:Hmjiz 2 hemorrhoids. Pancolonic diverticulosis. Colonic polyps removed as described above. One hemostasis clip applied. TUBULAR ADENOMA. Surveillance Sept 2019.    COLONOSCOPY N/A 02/06/2018   two 3-5 mm cecal polyps, pancolonic diverticulosis. tubular adenoma. no surveillance due to age   INSERTION OF MESH N/A 01/27/2013   Procedure: INSERTION OF MESH;  Surgeon: Oneil DELENA Budge, MD;  Location: [facility];  Service: General;  Laterality: N/A;   NASAL SEPTOPLASTY W/ TURBINOPLASTY Bilateral 08/24/2023   Procedure: SEPTOPLASTY, NOSE, WITH NASAL TURBINATE REDUCTION;  Surgeon: Karis Clunes, MD;  Location: [facility] OR;  Service: ENT;  Laterality: Bilateral;   POLYPECTOMY  02/06/2018   Procedure: POLYPECTOMY;  Surgeon: Shaaron Lamar HERO, MD;  Location: [facility];  Service: Endoscopy;;  (colon)   REVERSE SHOULDER ARTHROPLASTY Right 12/26/2023   Procedure: ARTHROPLASTY, SHOULDER, TOTAL, REVERSE;  Surgeon: Cristy Bonner DASEN, MD;  Location: [facility];  Service: Orthopedics;  Laterality: Right;   TONSILLECTOMY     as a child   UMBILICAL HERNIA  REPAIR N/A 01/27/2013   Procedure: HERNIA REPAIR UMBILICAL ADULT;  Surgeon: Oneil DELENA Budge, MD;  Location: [facility];  Service: General;  Laterality: N/A;   Family History  Problem Relation Age of Onset   Heart disease Father    Heart disease Mother    Bronchitis Mother    Allergies Other        whole family   Colon cancer Neg Hx    Social History   Socioeconomic History   Marital status: Married    Spouse name: Not on file   Number of children: Not on file   Years of education: Not on file   Highest education level: Not on file  Occupational History   Occupation: Retired    Associate Professor: [employer]    Comment: arboriculturist  Tobacco Use   Smoking status: Never   Smokeless tobacco: Never  Vaping Use   Vaping status: Never Used  Substance and Sexual Activity   Alcohol use: No    Alcohol/week: 0.0 standard drinks of alcohol   Drug use: Never   Sexual activity: Yes    Partners: Female    Comment: married  Other Topics Concern   Not on file  Social History Narrative   Not on file   Social Drivers of Health   Financial Resource Strain: Low Risk  (01/03/2024)   Overall Financial Resource Strain (CARDIA)    Difficulty of Paying Living Expenses: Not hard at all  Food Insecurity: No Food Insecurity (01/03/2024)   Hunger Vital Sign    Worried About Running Out of Food in the Last Year: Never true    Ran Out of Food in the Last Year: Never true  Transportation Needs: No Transportation Needs (01/03/2024)   PRAPARE - Administrator, Civil Service (Medical): No    Lack of Transportation (Non-Medical): No  Physical Activity: Inactive (01/03/2024)   Exercise Vital Sign    Days of Exercise per Week: 0 days    Minutes of Exercise per Session: 0 min  Stress: No Stress Concern Present (01/03/2024)   [employer] of Occupational Health - Occupational Stress Questionnaire    Feeling of Stress: Not at all  Social Connections: Socially Integrated (01/03/2024)    Social Connection and Isolation Panel    Frequency of Communication with Friends and Family: More than three times a week    Frequency of Social Gatherings with Friends and Family: More than three times a week    Attends Religious Services: More than 4 times per year    Active Member of [employer] or Organizations: Yes    Attends Banker Meetings: Never    Marital Status: Married    Tobacco Counseling Counseling given: Yes    Clinical Intake:  Pre-visit preparation completed: Yes  Pain : No/denies pain     BMI - recorded: 31.14 Nutritional Status: BMI > 30  Obese Nutritional Risks: None Diabetes: Yes  Lab Results  Component Value Date   HGBA1C 6.2 (H) 11/29/2023   HGBA1C 6.0 (H) 11/01/2016   HGBA1C 6.2 (H) 04/29/2012     How often do you need to have someone help you when you read instructions, pamphlets, or other written materials from your doctor or pharmacy?: 1 - Never  Interpreter Needed?: No  Information entered by :: alia t/cma   Activities of Daily Living     01/03/2024   11:27 AM 12/19/2023    2:32 PM  In your present state of health, do you have any difficulty performing the following activities:  Hearing? 0   Vision? 0   Difficulty concentrating or making decisions? 0   Walking or climbing stairs? 0   Dressing or bathing? 0   Doing errands, shopping? 1 0  Comment currently not right now due to shoulder surgery   Preparing Food and eating ? N   Using the Toilet? N   In the past six months, have you accidently leaked urine? Y   Do you have problems with loss of bowel control? N   Managing your Medications? N   Managing your Finances? Y   Comment pt's wife   Housekeeping or managing your Housekeeping? N     Patient Care Team: Deitra Morton Hummer, Nena, NP as PCP - General (Nurse Practitioner) Stacia Diannah SQUIBB, MD as PCP - Cardiology (Cardiology) Margrette Taft BRAVO, MD (Orthopedic Surgery) Shaaron Lamar HERO,  MD as Consulting  Physician (Gastroenterology)  I have updated your Care Teams any recent Medical Services you may have received from other providers in the past year.     Assessment:   This is a routine wellness examination for Riley [name].  Hearing/Vision screen Hearing Screening - Comments:: Pt denies hearing dif  Vision Screening - Comments:: Pt denies vision dif/   Goals Addressed             This Visit's Progress    Activity and Exercise Increased   On track    Evidence-based guidance:  Review current exercise levels.  Assess patient perspective on exercise or activity level, barriers to increasing activity, motivation and readiness for change.  Recommend or set healthy exercise goal based on individual tolerance.  Encourage small steps toward making change in amount of exercise or activity.  Urge reduction of sedentary activities or screen time.  Promote group activities within the community or with family or support person.  Consider referral to rehabiliation therapist for assessment and exercise/activity plan.   Notes:        Depression Screen     01/03/2024   11:35 AM 11/29/2023    2:19 PM  PHQ 2/9 Scores  PHQ - 2 Score 1 1  PHQ- 9 Score 1 3    Fall Risk     01/03/2024   11:25 AM  Fall Risk   Falls in the past year? 0  Number falls in past yr: 0  Injury with Fall? 0  Risk for fall due to : No Fall Risks  Follow up Falls evaluation completed;Education provided    MEDICARE RISK AT HOME:  Medicare Risk at Home Any stairs in or around the home?: Yes If so, are there any without handrails?: Yes Home free of loose throw rugs in walkways, pet beds, electrical cords, etc?: Yes Adequate lighting in your home to reduce risk of falls?: Yes Life alert?: No Use of a cane, walker or w/c?: No Grab bars in the bathroom?: Yes Shower chair or bench in shower?: Yes Elevated toilet seat or a handicapped toilet?: Yes  TIMED UP AND GO:  Was the test performed?  No  Cognitive Function:  6CIT completed        01/03/2024   11:33 AM  6CIT Screen  What Year? 0 points  What month? 0 points  What time? 0 points  Count back from 20 0 points  Months in reverse 0 points  Repeat phrase 0 points  Total Score 0 points    Immunizations Immunization History  Administered Date(s) Administered   INFLUENZA, HIGH DOSE SEASONAL PF 12/03/2016, 01/02/2018   Influenza Whole 12/06/2011   Influenza-Unspecified 01/15/2014   Moderna Sars-Covid-2 Vaccination 05/06/2019, 06/03/2019   Pneumococcal Polysaccharide-23 05/01/2012    Screening Tests Health Maintenance  Topic Date Due   DTaP/Tdap/Td (1 - Tdap) Never done   Zoster Vaccines- Shingrix (1 of 2) Never done   Pneumococcal Vaccine: 50+ Years (2 of 2 - PCV) 05/01/2013   COVID-19 Vaccine (3 - 2025-26 season) 11/05/2023   OPHTHALMOLOGY EXAM  03/20/2024 (Originally 01/22/1956)   Influenza Vaccine  06/03/2024 (Originally 10/05/2023)   Hepatitis C Screening  11/28/2024 (Originally 01/22/1964)   HEMOGLOBIN A1C  05/28/2024   Diabetic kidney evaluation - Urine ACR  11/28/2024   FOOT EXAM  11/28/2024   Diabetic kidney evaluation - eGFR measurement  12/18/2024   Medicare Annual Wellness (AWV)  01/02/2025   Meningococcal B Vaccine  Aged Out   Colonoscopy  Discontinued  Health Maintenance Items Addressed: See Nurse Notes at the end of this note  Additional Screening:  Vision Screening: Recommended annual ophthalmology exams for early detection of glaucoma and other disorders of the eye. Is the patient up to date with their annual eye exam? no Who is the provider or what is the name of the office in which the patient attends annual eye exams? Pt is aware due for an eye exam, have not had eye exam in a couple yrs  Dental Screening: Recommended annual dental exams for proper oral hygiene  Community Resource Referral / Chronic Care Management: CRR required this visit?  No   CCM required this visit?  No   Plan:    I have  personally reviewed and noted the following in the patient's chart:   Medical and social history Use of alcohol, tobacco or illicit drugs  Current medications and supplements including opioid prescriptions. Patient is not currently taking opioid prescriptions. Functional ability and status Nutritional status Physical activity Advanced directives List of other physicians Hospitalizations, surgeries, and ER visits in previous 12 months Vitals Screenings to include cognitive, depression, and falls Referrals and appointments  In addition, I have reviewed and discussed with patient certain preventive protocols, quality metrics, and best practice recommendations. A written personalized care plan for preventive services as well as general preventive health recommendations were provided to patient.   Ozie Ned, CMA   01/03/2024   After Visit Summary: (MyChart) Due to this being a telephonic visit, the after visit summary with patients personalized plan was offered to patient via MyChart   Notes: PCP Follow Up Recommendations: pt aware and due the following: covid, shingles, pneumonia, DTAP vaccine. Pt will get as soon as he is able to. Pt is aware due for an eye exam, have not had eye exam in a couple yrs

## 2024-01-03 NOTE — Telephone Encounter (Signed)
 During pt's awv today, pt request for med refills.

## 2024-01-08 ENCOUNTER — Ambulatory Visit: Attending: Cardiology | Admitting: *Deleted

## 2024-01-08 DIAGNOSIS — I480 Paroxysmal atrial fibrillation: Secondary | ICD-10-CM

## 2024-01-08 DIAGNOSIS — Z5181 Encounter for therapeutic drug level monitoring: Secondary | ICD-10-CM

## 2024-01-08 LAB — POCT INR: INR: 2.4 (ref 2.0–3.0)

## 2024-01-08 NOTE — Patient Instructions (Signed)
 Continue 1 tablet daily except 1 1/2 tablets Saturdays  S/P Rt shoulder surgery on 12/26/23 Recheck 6 wks

## 2024-01-08 NOTE — Progress Notes (Signed)
 INR 2.4 Please see anticoagulation encounter

## 2024-01-21 ENCOUNTER — Ambulatory Visit (HOSPITAL_BASED_OUTPATIENT_CLINIC_OR_DEPARTMENT_OTHER): Admitting: Pulmonary Disease

## 2024-02-09 ENCOUNTER — Ambulatory Visit
Admission: EM | Admit: 2024-02-09 | Discharge: 2024-02-09 | Disposition: A | Discharge status: Home / Self Care | Attending: Family Medicine | Admitting: Family Medicine

## 2024-02-09 DIAGNOSIS — J0110 Acute frontal sinusitis, unspecified: Secondary | ICD-10-CM

## 2024-02-09 DIAGNOSIS — R051 Acute cough: Secondary | ICD-10-CM | POA: Diagnosis not present

## 2024-02-09 MED ORDER — AMOXICILLIN 875 MG PO TABS: 875.0000 mg | ORAL_TABLET | Freq: Two times a day (BID) | ORAL | 0 refills | Status: AC

## 2024-02-09 NOTE — ED Provider Notes (Signed)
 [facility]    CSN: [id number] Arrival date & time: 02/09/24  1107      History   Chief Complaint No chief complaint on file.   HPI Riley [name] is a 78 y.o. male.   Patient is a 78 year old male that presents today with approximately 1 week or more of worsening nasal congestion, postnasal drainage, cough, headache.  He has been taking Tylenol  and Coricidin for his symptoms.  Seems to help some but the symptoms returned.  Denies any chest pain, shortness of breath, fevers, chills or bodyaches.     Past Medical History:  Diagnosis Date   Arthritis    Cancer (HCC)    skin   Chronic anticoagulation    Chronic back pain    Diabetes mellitus, type 2 (HCC)    no insulin    Dysrhythmia    Edema    GERD (gastroesophageal reflux disease)    Hyperlipidemia    Hypertension    normal coronary angiography and normal EF in 12/98   Nodule of left lung    Granuloma in left upper lobe   Paroxysmal atrial fibrillation (HCC)    12/2005; sinus bradycardia secondary to medications   Sleep apnea     Patient Active Problem List   Diagnosis Date Noted   Stage 3a chronic kidney disease (HCC) 11/29/2023   Encounter for general adult medical examination with abnormal findings 11/29/2023   Chronic rhinitis 07/13/2023   Deviated nasal septum 07/13/2023   Hypertrophy of nasal turbinates 07/13/2023   Persistent atrial fibrillation (HCC) 04/08/2023   Nasal dryness 12/08/2022   Chronic venous insufficiency 09/07/2020   Left heart failure (HCC) 01/16/2019   Proteinuria 01/16/2019   Abdominal pain 10/09/2018   GERD (gastroesophageal reflux disease) 12/20/2017   H/O adenomatous polyp of colon 12/20/2017   Atrial paroxysmal tachycardia 08/01/2017   Coronary arteriosclerosis 08/01/2017   Stage 2 chronic kidney disease 08/01/2017   Type 2 diabetes mellitus with diabetic nephropathy (HCC) 08/01/2017   Morbid obesity (HCC) 02/11/2015   Encounter for therapeutic drug monitoring  04/10/2013   Constipation 11/20/2012   Hemorrhoids 11/20/2012   OSA (obstructive sleep apnea) 06/26/2012   Seasonal allergies 04/29/2012   Paroxysmal atrial fibrillation (HCC)    Chronic anticoagulation    Hypertension    Diabetes mellitus type II, controlled (HCC)    Hyperlipidemia    Pedal edema 06/08/2009    Past Surgical History:  Procedure Laterality Date   APPENDECTOMY  1998   COLONOSCOPY N/A 11/26/2012   MFM:Hmjiz 2 hemorrhoids. Pancolonic diverticulosis. Colonic polyps removed as described above. One hemostasis clip applied. TUBULAR ADENOMA. Surveillance Sept 2019.    COLONOSCOPY N/A 02/06/2018   two 3-5 mm cecal polyps, pancolonic diverticulosis. tubular adenoma. no surveillance due to age   INSERTION OF MESH N/A 01/27/2013   Procedure: INSERTION OF MESH;  Surgeon: Oneil DELENA Budge, MD;  Location: [facility];  Service: General;  Laterality: N/A;   NASAL SEPTOPLASTY W/ TURBINOPLASTY Bilateral 08/24/2023   Procedure: SEPTOPLASTY, NOSE, WITH NASAL TURBINATE REDUCTION;  Surgeon: Karis Clunes, MD;  Location: [facility];  Service: ENT;  Laterality: Bilateral;   POLYPECTOMY  02/06/2018   Procedure: POLYPECTOMY;  Surgeon: Shaaron Lamar HERO, MD;  Location: [facility];  Service: Endoscopy;;  (colon)   REVERSE SHOULDER ARTHROPLASTY Right 12/26/2023   Procedure: ARTHROPLASTY, SHOULDER, TOTAL, REVERSE;  Surgeon: Cristy Bonner ONEIDA, MD;  Location: [facility];  Service: Orthopedics;  Laterality: Right;   TONSILLECTOMY     as a child  UMBILICAL HERNIA REPAIR N/A 01/27/2013   Procedure: HERNIA REPAIR UMBILICAL ADULT;  Surgeon: Oneil DELENA Budge, MD;  Location: [facility];  Service: General;  Laterality: N/A;       Home Medications    Prior to Admission medications   Medication Sig Start Date End Date Taking? Authorizing Provider  amoxicillin  (AMOXIL ) 875 MG tablet Take 1 tablet (875 mg total) by mouth 2 (two) times daily for 7 days. 02/09/24 02/16/24 Yes Errika Narvaiz A, FNP  calcium  carbonate (TUMS - DOSED IN MG  ELEMENTAL CALCIUM ) 500 MG chewable tablet Chew 1 tablet by mouth as needed for indigestion or heartburn.    [provider]  chlorthalidone  (HYGROTON ) 25 MG tablet Take 12.5 mg by mouth every other day.    [provider]  diltiazem  (CARDIZEM ) 30 MG tablet Take 1 tablet (30 mg total) by mouth daily as needed (Palpations). 01/01/18   Charls Pearla DELENA, MD  DM-APAP-CPM (CORICIDIN HBP) 10-325-2 MG TABS Take 1 tablet by mouth 2 (two) times daily as needed (cold symptoms).    [provider]  doxazosin  (CARDURA ) 4 MG tablet TAKE THREE TABLETS BY MOUTH EVERY EVENING 06/28/23   Mallipeddi, Vishnu P, MD  furosemide  (LASIX ) 20 MG tablet Take 20 mg by mouth daily as needed for fluid or edema.    [provider]  glimepiride  (AMARYL ) 1 MG tablet Take 1 tablet (1 mg total) by mouth daily. 01/01/24   [facility] Sebastian Pool, NP  hydrALAZINE  (APRESOLINE ) 100 MG tablet Take 1 tablet (100 mg total) by mouth 3 (three) times daily. 09/04/22   Mallipeddi, Vishnu P, MD  Hydrocortisone (PREPARATION H EX) Place 1 application rectally daily as needed (irritation/discomfort).     [provider]  linaclotide  (LINZESS ) 290 MCG CAPS capsule Take 1 capsule (290 mcg total) by mouth every morning. 12/20/23   Shirlean Therisa ORN, NP  lisinopril  (ZESTRIL ) 20 MG tablet TAKE (1) TABLET BY MOUTH ONCE DAILY. 06/28/23   Mallipeddi, Vishnu P, MD  Magnesium  250 MG TABS Take 125 mg by mouth at bedtime.    [provider]  metFORMIN  (GLUCOPHAGE ) 1000 MG tablet Take 1 tablet (1,000 mg total) by mouth 2 (two) times daily with a meal. 01/01/24   [facility] Sebastian, Pool, NP  metoprolol  tartrate (LOPRESSOR ) 50 MG tablet Take 50 mg by mouth 2 (two) times daily.    [provider]  Multiple Vitamin (MULTIVITAMIN) tablet Take 1 tablet by mouth daily.    [provider]  pravastatin  (PRAVACHOL ) 20 MG tablet Take 1 tablet (20 mg total) by mouth every evening. 06/28/23   Mallipeddi,  Vishnu P, MD  warfarin (COUMADIN ) 5 MG tablet TAKE 1 TABLET BY MOUTH ONCE DAILY, EXCEPT TAKE 1 AND 1/2 TABLETS ON SATURDAYS OR AS DIRECTED. 12/03/23   Mallipeddi, Diannah SQUIBB, MD    Family History Family History  Problem Relation Age of Onset   Heart disease Father    Heart disease Mother    Bronchitis Mother    Allergies Other        whole family   Colon cancer Neg Hx     Social History Social History   Tobacco Use   Smoking status: Never   Smokeless tobacco: Never  Vaping Use   Vaping status: Never Used  Substance Use Topics   Alcohol use: No    Alcohol/week: 0.0 standard drinks of alcohol   Drug use: Never     Allergies   Patient has no known allergies.   Review  of Systems Review of Systems See HPI  Physical Exam Triage Vital Signs ED Triage Vitals  Encounter Vitals Group     BP 02/09/24 1114 (!) 153/98     Girls Systolic BP Percentile --      Girls Diastolic BP Percentile --      Boys Systolic BP Percentile --      Boys Diastolic BP Percentile --      Pulse Rate 02/09/24 1114 66     Resp 02/09/24 1114 20     Temp 02/09/24 1114 98.3 F (36.8 C)     Temp Source 02/09/24 1114 Oral     SpO2 02/09/24 1114 96 %     Weight --      Height --      Head Circumference --      Peak Flow --      Pain Score 02/09/24 1113 0     Pain Loc --      Pain Education --      Exclude from Growth Chart --    No data found.  Updated Vital Signs BP (!) 153/98 (BP Location: Right Arm)   Pulse 66   Temp 98.3 F (36.8 C) (Oral)   Resp 20   SpO2 96%   Visual Acuity Right Eye Distance:   Left Eye Distance:   Bilateral Distance:    Right Eye Near:   Left Eye Near:    Bilateral Near:     Physical Exam Constitutional:      General: He is not in acute distress.    Appearance: Normal appearance. He is not ill-appearing, toxic-appearing or diaphoretic.  HENT:     Right Ear: Tympanic membrane, ear canal and external ear normal.     Left Ear: Tympanic membrane, ear  canal and external ear normal.     Nose: Congestion present.     Mouth/Throat:     Pharynx: Oropharynx is clear.  Eyes:     Conjunctiva/sclera: Conjunctivae normal.  Cardiovascular:     Rate and Rhythm: Normal rate and regular rhythm.     Pulses: Normal pulses.     Heart sounds: Normal heart sounds.  Pulmonary:     Effort: Pulmonary effort is normal.     Breath sounds: Normal breath sounds.  Musculoskeletal:        General: Normal range of motion.  Skin:    General: Skin is warm and dry.  Neurological:     Mental Status: He is alert.  Psychiatric:        Mood and Affect: Mood normal.      UC Treatments / Results  Labs (all labs ordered are listed, but only abnormal results are displayed) Labs Reviewed - No data to display  EKG   Radiology No results found.  Procedures Procedures (including critical care time)  Medications Ordered in UC Medications - No data to display  Initial Impression / Assessment and Plan / UC Course  I have reviewed the triage vital signs and the nursing notes.  Pertinent labs & imaging results that were available during my care of the patient were reviewed by me and considered in my medical decision making (see chart for details).     Acute sinusitis with cough-treating for sinus infection with amoxicillin  at this time.  Recommended continue over-the-counter medications for symptoms as needed.  Follow-up as needed Final Clinical Impressions(s) / UC Diagnoses   Final diagnoses:  Acute non-recurrent frontal sinusitis  Acute cough     Discharge Instructions  Take the antibiotics as prescribed for sinus infection.  You can continue with over-the-counter medications for your symptoms as needed. Follow-up as needed   ED Prescriptions     Medication Sig Dispense Auth. Provider   amoxicillin  (AMOXIL ) 875 MG tablet Take 1 tablet (875 mg total) by mouth 2 (two) times daily for 7 days. 14 tablet Adah Wilbert LABOR, FNP      PDMP not  reviewed this encounter.   Adah Wilbert LABOR, FNP 02/09/24 1202

## 2024-02-09 NOTE — Discharge Instructions (Signed)
 Take the antibiotics as prescribed for sinus infection.  You can continue with over-the-counter medications for your symptoms as needed. Follow-up as needed

## 2024-02-09 NOTE — ED Triage Notes (Signed)
 Pt reports he has a sore throat, nasal congestion, headache and cough x 1 week  Took tylenol , coricidin

## 2024-02-19 ENCOUNTER — Ambulatory Visit: Attending: Cardiology

## 2024-02-19 DIAGNOSIS — I480 Paroxysmal atrial fibrillation: Secondary | ICD-10-CM

## 2024-02-19 DIAGNOSIS — Z5181 Encounter for therapeutic drug level monitoring: Secondary | ICD-10-CM

## 2024-02-19 LAB — POCT INR: INR: 2.9 (ref 2.0–3.0)

## 2024-02-19 NOTE — Patient Instructions (Signed)
 Continue 1 tablet daily except 1 1/2 tablets Saturdays  S/P Rt shoulder surgery on 12/26/23 Recheck 6 wks

## 2024-02-19 NOTE — Progress Notes (Signed)
 INR 2.9; Please see anticoagulation encounter

## 2024-02-22 ENCOUNTER — Ambulatory Visit: Payer: Self-pay | Admitting: Family Medicine

## 2024-03-03 ENCOUNTER — Ambulatory Visit: Payer: Self-pay | Admitting: Family Medicine

## 2024-03-03 ENCOUNTER — Ambulatory Visit

## 2024-03-03 VITALS — BP 171/99 | HR 85 | Ht 72.0 in | Wt 248.1 lb

## 2024-03-03 DIAGNOSIS — I872 Venous insufficiency (chronic) (peripheral): Secondary | ICD-10-CM | POA: Diagnosis not present

## 2024-03-03 DIAGNOSIS — J310 Chronic rhinitis: Secondary | ICD-10-CM

## 2024-03-03 DIAGNOSIS — E1121 Type 2 diabetes mellitus with diabetic nephropathy: Secondary | ICD-10-CM | POA: Diagnosis not present

## 2024-03-03 DIAGNOSIS — Z7984 Long term (current) use of oral hypoglycemic drugs: Secondary | ICD-10-CM | POA: Diagnosis not present

## 2024-03-03 MED ORDER — METFORMIN HCL 1000 MG PO TABS: 1000.0000 mg | ORAL_TABLET | Freq: Two times a day (BID) | ORAL | 3 refills | Status: AC

## 2024-03-03 MED ORDER — GLIMEPIRIDE 1 MG PO TABS: 1.0000 mg | ORAL_TABLET | Freq: Every day | ORAL | 3 refills | Status: AC

## 2024-03-03 NOTE — Patient Instructions (Signed)
 Recommend compression socks 15-30 mmHg for swelling in legs.  A good brand is [employer] which are available on [employer].

## 2024-03-03 NOTE — Progress Notes (Unsigned)
 "  New Patient Office Visit  Subjective    Patient ID: Riley [name], male    DOB: 1945-04-11  Age: 78 y.o. MRN: [id number]  CC:  Chief Complaint  Patient presents with   Establish Care    Pt here to establish care and is requesting to check A1C    HPI Riley [name] presents to establish care  Discussed the use of AI scribe software for clinical note transcription with the patient, who gave verbal consent to proceed.  History of Present Illness    Riley [name] is a 78 year old male who presents for establishment of care and management of multiple chronic conditions.  Shoulder pain and limited range of motion - Status post right shoulder surgery earlier this year for a completely worn-out rotator cuff - Persistent difficulty raising right hand above shoulder height - Right-handed, which increases functional limitation - Describes shoulder as 'shredded'  Knee pain - Chronic knee pain, worsened with weight-bearing and stair climbing - Received gel injections for pain management - Knee X-rays performed  Sinus congestion and post-surgical symptoms - History of chronic sinus issues with nasal congestion, head pressure, and burning chest pain - Underwent sinus surgery this year for sinus cleaning - Uses Corcetin, Symax nasal spray, saline spray, and nasal gel for symptom control - Takes Xyzal for possible allergies  Hypertension - Recent blood pressure readings 120-126/70 - Transient increase in blood pressure to 150 after taking Robitussin Extra Strength  Diabetes mellitus - On metformin  and glimepiride  for glycemic control - Most recent A1c is 6.2  Lower extremity edema - Chronic swelling in legs, predominantly right leg - Previously treated with Lasix   Muscle aches and soreness - Chronic muscle aches and soreness - Takes 250 mg magnesium  supplement daily - Uses one-a-day men's multivitamin  Atrial fibrillation - History of atrial fibrillation - Atrial fibrillation  affects accuracy of blood pressure measurements     Outpatient Encounter Medications as of 03/03/2024  Medication Sig   calcium  carbonate (TUMS - DOSED IN MG ELEMENTAL CALCIUM ) 500 MG chewable tablet Chew 1 tablet by mouth as needed for indigestion or heartburn.   chlorthalidone  (HYGROTON ) 25 MG tablet Take 12.5 mg by mouth every other day.   diltiazem  (CARDIZEM ) 30 MG tablet Take 1 tablet (30 mg total) by mouth daily as needed (Palpations).   DM-APAP-CPM (CORICIDIN HBP) 10-325-2 MG TABS Take 1 tablet by mouth 2 (two) times daily as needed (cold symptoms).   doxazosin  (CARDURA ) 4 MG tablet TAKE THREE TABLETS BY MOUTH EVERY EVENING   furosemide  (LASIX ) 20 MG tablet Take 20 mg by mouth daily as needed for fluid or edema.   hydrALAZINE  (APRESOLINE ) 100 MG tablet Take 1 tablet (100 mg total) by mouth 3 (three) times daily.   Hydrocortisone (PREPARATION H EX) Place 1 application rectally daily as needed (irritation/discomfort).    linaclotide  (LINZESS ) 290 MCG CAPS capsule Take 1 capsule (290 mcg total) by mouth every morning.   lisinopril  (ZESTRIL ) 20 MG tablet TAKE (1) TABLET BY MOUTH ONCE DAILY.   Magnesium  250 MG TABS Take 125 mg by mouth at bedtime.   metoprolol  tartrate (LOPRESSOR ) 50 MG tablet Take 50 mg by mouth 2 (two) times daily.   Multiple Vitamin (MULTIVITAMIN) tablet Take 1 tablet by mouth daily.   pravastatin  (PRAVACHOL ) 20 MG tablet Take 1 tablet (20 mg total) by mouth every evening.   warfarin (COUMADIN ) 5 MG tablet TAKE 1 TABLET BY MOUTH ONCE DAILY, EXCEPT TAKE 1 AND 1/2  TABLETS ON SATURDAYS OR AS DIRECTED.   [DISCONTINUED] glimepiride  (AMARYL ) 1 MG tablet Take 1 tablet (1 mg total) by mouth daily.   [DISCONTINUED] metFORMIN  (GLUCOPHAGE ) 1000 MG tablet Take 1 tablet (1,000 mg total) by mouth 2 (two) times daily with a meal.   glimepiride  (AMARYL ) 1 MG tablet Take 1 tablet (1 mg total) by mouth daily.   metFORMIN  (GLUCOPHAGE ) 1000 MG tablet Take 1 tablet (1,000 mg total) by mouth 2  (two) times daily with a meal.   No facility-administered encounter medications on file as of 03/03/2024.    Past Medical History:  Diagnosis Date   Arthritis    Cancer (HCC)    skin   Chronic anticoagulation    Chronic back pain    Diabetes mellitus, type 2 (HCC)    no insulin    Dysrhythmia    Edema    GERD (gastroesophageal reflux disease)    Hyperlipidemia    Hypertension    normal coronary angiography and normal EF in 12/98   Nodule of left lung    Granuloma in left upper lobe   Paroxysmal atrial fibrillation (HCC)    12/2005; sinus bradycardia secondary to medications   Sleep apnea     Past Surgical History:  Procedure Laterality Date   APPENDECTOMY  1998   COLONOSCOPY N/A 11/26/2012   MFM:Hmjiz 2 hemorrhoids. Pancolonic diverticulosis. Colonic polyps removed as described above. One hemostasis clip applied. TUBULAR ADENOMA. Surveillance Sept 2019.    COLONOSCOPY N/A 02/06/2018   two 3-5 mm cecal polyps, pancolonic diverticulosis. tubular adenoma. no surveillance due to age   INSERTION OF MESH N/A 01/27/2013   Procedure: INSERTION OF MESH;  Surgeon: Oneil DELENA Budge, MD;  Location: [facility];  Service: General;  Laterality: N/A;   NASAL SEPTOPLASTY W/ TURBINOPLASTY Bilateral 08/24/2023   Procedure: SEPTOPLASTY, NOSE, WITH NASAL TURBINATE REDUCTION;  Surgeon: Karis Clunes, MD;  Location: [facility];  Service: ENT;  Laterality: Bilateral;   POLYPECTOMY  02/06/2018   Procedure: POLYPECTOMY;  Surgeon: Shaaron Lamar HERO, MD;  Location[facility];  Service: Endoscopy;;  (colon)   REVERSE SHOULDER ARTHROPLASTY Right 12/26/2023   Procedure: ARTHROPLASTY, SHOULDER, TOTAL, REVERSE;  Surgeon: Cristy Bonner DASEN, MD;  Location: [facility];  Service: Orthopedics;  Laterality: Right;   TONSILLECTOMY     as a child   UMBILICAL HERNIA REPAIR N/A 01/27/2013   Procedure: HERNIA REPAIR UMBILICAL ADULT;  Surgeon: Oneil DELENA Budge, MD;  Location: [facility];  Service: General;  Laterality: N/A;    Family History   Problem Relation Age of Onset   Heart disease Father    Heart disease Mother    Bronchitis Mother    Allergies Other        whole family   Colon cancer Neg Hx     Social History   Socioeconomic History   Marital status: Married    Spouse name: Not on file   Number of children: Not on file   Years of education: Not on file   Highest education level: Not on file  Occupational History   Occupation: Retired    Associate Professor: [employer]    Comment: arboriculturist  Tobacco Use   Smoking status: Never   Smokeless tobacco: Never  Vaping Use   Vaping status: Never Used  Substance and Sexual Activity   Alcohol use: No    Alcohol/week: 0.0 standard drinks of alcohol   Drug use: Never   Sexual activity: Yes    Partners: Female  Comment: married  Other Topics Concern   Not on file  Social History Narrative   Not on file   Social Drivers of Health   Tobacco Use: Low Risk (03/03/2024)   Patient History    Smoking Tobacco Use: Never    Smokeless Tobacco Use: Never    Passive Exposure: Not on file  Financial Resource Strain: Low Risk (01/03/2024)   Overall Financial Resource Strain (CARDIA)    Difficulty of Paying Living Expenses: Not hard at all  Food Insecurity: No Food Insecurity (01/03/2024)   Epic    Worried About Programme Researcher, Broadcasting/film/video in the Last Year: Never true    Ran Out of Food in the Last Year: Never true  Transportation Needs: No Transportation Needs (01/03/2024)   Epic    Lack of Transportation (Medical): No    Lack of Transportation (Non-Medical): No  Physical Activity: Inactive (01/03/2024)   Exercise Vital Sign    Days of Exercise per Week: 0 days    Minutes of Exercise per Session: 0 min  Stress: No Stress Concern Present (01/03/2024)   [employer] of Occupational Health - Occupational Stress Questionnaire    Feeling of Stress: Not at all  Social Connections: Socially Integrated (01/03/2024)   Social Connection and Isolation Panel     Frequency of Communication with Friends and Family: More than three times a week    Frequency of Social Gatherings with Friends and Family: More than three times a week    Attends Religious Services: More than 4 times per year    Active Member of [employer] or Organizations: Yes    Attends Banker Meetings: Never    Marital Status: Married  Catering Manager Violence: Not At Risk (01/03/2024)   Epic    Fear of Current or Ex-Partner: No    Emotionally Abused: No    Physically Abused: No    Sexually Abused: No  Depression (PHQ2-9): Low Risk (03/03/2024)   Depression (PHQ2-9)    PHQ-2 Score: 4  Alcohol Screen: Low Risk (11/29/2023)   Alcohol Screen    Last Alcohol Screening Score (AUDIT): 0  Housing: Unknown (01/03/2024)   Epic    Unable to Pay for Housing in the Last Year: No    Number of Times Moved in the Last Year: Not on file    Homeless in the Last Year: No  Utilities: Not At Risk (01/03/2024)   Epic    Threatened with loss of utilities: No  Health Literacy: Adequate Health Literacy (01/03/2024)   B1300 Health Literacy    Frequency of need for help with medical instructions: Never    ROS      Objective    BP (!) 171/99   Pulse 85   Ht 6' (1.829 m)   Wt 248 lb 1.3 oz (112.5 kg)   SpO2 (!) 85%   BMI 33.65 kg/m   Physical Exam Vitals and nursing note reviewed.  Constitutional:      Appearance: Normal appearance.  HENT:     Head: Normocephalic.  Eyes:     Extraocular Movements: Extraocular movements intact.     Pupils: Pupils are equal, round, and reactive to light.  Cardiovascular:     Rate and Rhythm: Normal rate and regular rhythm.  Pulmonary:     Effort: Pulmonary effort is normal.     Breath sounds: Normal breath sounds.  Musculoskeletal:     Cervical back: Normal range of motion and neck supple.  Neurological:     Mental  Status: He is alert and oriented to person, place, and time.  Psychiatric:        Mood and Affect: Mood normal.         Thought Content: Thought content normal.         Assessment & Plan:   Problem List Items Addressed This Visit       Cardiovascular and Mediastinum   Venous insufficiency of both lower extremities   Chronic venous insufficiency with leg swelling Managed with Lasix  as needed. - Recommended compression socks with 15-30 mmHg compression. - Continue Lasix  as needed.        Respiratory   Chronic rhinitis   Managed with Coricidan, sinus nasal spray, and Zyrtec - Consider Xyzal for additional allergy control.        Endocrine   Type 2 diabetes mellitus with diabetic nephropathy (HCC) - Primary (Chronic)   Well-controlled with A1c at 6.2. - Checked A1c and kidney function. - Sent prescriptions for metformin  and glimepiride  to [facility].      Relevant Medications   glimepiride  (AMARYL ) 1 MG tablet   metFORMIN  (GLUCOPHAGE ) 1000 MG tablet   Other Relevant Orders   CMP14+EGFR (Completed)   HgB A1c (Completed)    Return in about 4 months (around 07/02/2024) for chronic follow-up with PCP.   Leita Longs, FNP   "

## 2024-03-04 LAB — CMP14+EGFR
ALT: 18 IU/L (ref 0–44)
AST: 16 IU/L (ref 0–40)
Albumin: 4 g/dL (ref 3.8–4.8)
Alkaline Phosphatase: 64 IU/L (ref 47–123)
BUN/Creatinine Ratio: 15 (ref 10–24)
BUN: 16 mg/dL (ref 8–27)
Bilirubin Total: 0.4 mg/dL (ref 0.0–1.2)
CO2: 25 mmol/L (ref 20–29)
Calcium: 9.6 mg/dL (ref 8.6–10.2)
Chloride: 99 mmol/L (ref 96–106)
Creatinine, Ser: 1.05 mg/dL (ref 0.76–1.27)
Globulin, Total: 1.7 g/dL (ref 1.5–4.5)
Glucose: 86 mg/dL (ref 70–99)
Potassium: 4.3 mmol/L (ref 3.5–5.2)
Sodium: 138 mmol/L (ref 134–144)
Total Protein: 5.7 g/dL — ABNORMAL LOW (ref 6.0–8.5)
eGFR: 73 mL/min/1.73

## 2024-03-04 LAB — HEMOGLOBIN A1C
Est. average glucose Bld gHb Est-mCnc: 131 mg/dL
Hgb A1c MFr Bld: 6.2 % — ABNORMAL HIGH (ref 4.8–5.6)

## 2024-03-05 ENCOUNTER — Ambulatory Visit: Payer: Self-pay

## 2024-03-05 NOTE — Assessment & Plan Note (Signed)
 Managed with Coricidan, sinus nasal spray, and Zyrtec - Consider Xyzal for additional allergy control.

## 2024-03-05 NOTE — Assessment & Plan Note (Signed)
 Chronic venous insufficiency with leg swelling Managed with Lasix  as needed. - Recommended compression socks with 15-30 mmHg compression. - Continue Lasix  as needed.

## 2024-03-05 NOTE — Assessment & Plan Note (Signed)
 Well-controlled with A1c at 6.2. - Checked A1c and kidney function. - Sent prescriptions for metformin  and glimepiride  to [facility].

## 2024-03-17 ENCOUNTER — Ambulatory Visit (HOSPITAL_BASED_OUTPATIENT_CLINIC_OR_DEPARTMENT_OTHER): Admitting: Pulmonary Disease

## 2024-03-17 ENCOUNTER — Encounter (HOSPITAL_BASED_OUTPATIENT_CLINIC_OR_DEPARTMENT_OTHER): Payer: Self-pay | Admitting: Pulmonary Disease

## 2024-03-17 VITALS — BP 175/101 | HR 69 | Temp 97.8°F | Ht 73.0 in | Wt 241.8 lb

## 2024-03-17 DIAGNOSIS — T8189XA Other complications of procedures, not elsewhere classified, initial encounter: Secondary | ICD-10-CM

## 2024-03-17 DIAGNOSIS — G4731 Primary central sleep apnea: Secondary | ICD-10-CM | POA: Diagnosis not present

## 2024-03-17 DIAGNOSIS — G4733 Obstructive sleep apnea (adult) (pediatric): Secondary | ICD-10-CM | POA: Diagnosis not present

## 2024-03-17 NOTE — Patient Instructions (Signed)
 Rx for auto bipap -EPAP min 10, PS +5, IPAP max 18

## 2024-03-17 NOTE — Progress Notes (Signed)
 "  Subjective:    Patient ID: Riley [name], male    DOB: 03-18-1945, 79 y.o.   MRN: [id number]   79 yo man with AFib followed for OSA  -night shift worker x 30y    PMH - AF Septoplasty 08/2023    Discussed the use of AI scribe software for clinical note transcription with the patient, who gave verbal consent to proceed.  History of Present Illness Riley [name] is a 79 year old male with obstructive sleep apnea and central apneas who presents for follow-up and treatment evaluation.  He has longstanding obstructive sleep apnea with more recent central apneas. CPAP at 10-18 cm H2O did not adequately reduce central events. He stopped CPAP for a couple of months around recent surgeries, then restarted it three weeks ago and feels he is managing well with it.  He underwent a sleep study with BiPAP but found the pressure and mask setup very uncomfortable and has not started BiPAP at home.  He had nasal reconstruction last year for breathing difficulty after discovery of nasal fractures. He also had a total shoulder replacement.  He has atrial fibrillation on Coumadin  and does not notice dyspnea or palpitations.  He has sinus issues and headaches that were worsened by CPAP-related nasal discomfort. Increasing the CPAP humidifier temperature to about 80-81 degrees has improved his nasal irritation.     Significant tests/ events reviewed   2014 PSG >>TST 128 mins - only 2-3 hrs of sleep  -RDI 37/h Started on autoCPAP 5-15  >> changed to 11 cm  CPAP titration 06/2023  treatment  emergent centrals + , corrected by BiPAP 18/13 cm  Review of Systems  neg for any significant sore throat, dysphagia, itching, sneezing, nasal congestion or excess/ purulent secretions, fever, chills, sweats, unintended wt loss, pleuritic or exertional cp, hempoptysis, orthopnea pnd or change in chronic leg swelling. Also denies presyncope, palpitations, heartburn, abdominal pain, nausea, vomiting, diarrhea or change  in bowel or urinary habits, dysuria,hematuria, rash, arthralgias, visual complaints, headache, numbness weakness or ataxia.      Objective:   Physical Exam  Gen. 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 Current CPAP settings (10-18 cm H2O) are insufficient, with high event numbers (36 events per hour) and a significant proportion of central events. Central events indicate a lack of respiratory drive rather than airway obstruction. Previous BiPAP trial (in the hospital )was poorly tolerated due to FF mask discomfort and excessive pressure settings. BiPAP may improve outcomes by providing pressure support during central events. Discussed potential for implantable device if BiPAP is ineffective, but prefer to avoid unless necessary. BiPAP settings will be adjusted to auto mode with EPAP minimum 10 cm H2O and IPAP maximum 15 cm H2O to ensure comfort and efficacy. - Ordered BiPAP with auto settings: EPAP minimum 10 cm H2O, IPAP maximum 15 cm H2O. He canc ontinue nasal mask - Ensured BiPAP settings are regulated to avoid excessive pressure. - Coordinated with [employer] for BiPAP delivery. -  Discussed insurance coverage and potential costs with him. -If this does not work, can consider [employer]

## 2024-03-18 ENCOUNTER — Encounter (INDEPENDENT_AMBULATORY_CARE_PROVIDER_SITE_OTHER): Payer: Self-pay | Admitting: Otolaryngology

## 2024-03-18 ENCOUNTER — Ambulatory Visit (INDEPENDENT_AMBULATORY_CARE_PROVIDER_SITE_OTHER): Admitting: Otolaryngology

## 2024-03-18 VITALS — BP 158/75 | HR 70 | Ht 72.0 in | Wt 241.0 lb

## 2024-03-18 DIAGNOSIS — J324 Chronic pansinusitis: Secondary | ICD-10-CM | POA: Insufficient documentation

## 2024-03-18 DIAGNOSIS — G4733 Obstructive sleep apnea (adult) (pediatric): Secondary | ICD-10-CM

## 2024-03-18 DIAGNOSIS — J329 Chronic sinusitis, unspecified: Secondary | ICD-10-CM

## 2024-03-18 NOTE — Progress Notes (Signed)
 Patient ID: Riley [name], male   DOB: Jul 02, 1945, 79 y.o.   MRN: [id number]  Follow up: Chronic nasal congestion, recurrent sinusitis  History of Present Illness Riley [name] is a 79 year old male with chronic nasal congestion who presents for evaluation of recurrent sinus infections and nasal dryness.  He underwent septoplasty and turbinate reduction surgery 6 months ago, with significant improvement in his nasal breathing.  However, over the past year, he has experienced multiple episodes of sinus infection characterized by facial pressure and pain across the upper face, intermittent nasal pressure, and transient nasal obstruction, particularly during showering and relieved by exposure to cool air. He also reports irritation in the nostrils, especially with cold air exposure, and intermittent nasal dryness.  He manages symptoms with Tylenol  Sinus, fluticasone  nasal spray, and saline spray for episodes of dryness. Saline irrigation is performed as needed and provides symptomatic relief.  He also has OSA and uses a CPAP machine and previously had difficulty tolerating it due to a raw sensation in the nose, but over the past three weeks has been able to use it more comfortably after increasing the water  temperature to 81 degrees, which has improved his tolerance.  Exam: General: Communicates without difficulty, well nourished, no acute distress. Head: Normocephalic, no evidence injury, no tenderness, facial buttresses intact without stepoff. Face/sinus: No tenderness to palpation and percussion. Facial movement is normal and symmetric. Eyes: PERRL, EOMI. No scleral icterus, conjunctivae clear. Neuro: CN II exam reveals vision grossly intact.  No nystagmus at any point of gaze. Ears: Auricles well formed without lesions.  Ear canals are intact without mass or lesion.  No erythema or edema is appreciated.  The TMs are intact without fluid. Nose: External evaluation reveals normal support and skin without  lesions.  Dorsum is intact.  Anterior rhinoscopy reveals congested mucosa over anterior aspect of inferior turbinates and intact septum.  No purulence noted. Oral:  Oral cavity and oropharynx are intact, symmetric, without erythema or edema.  Mucosa is moist without lesions. Neck: Full range of motion without pain.  There is no significant lymphadenopathy.  No masses palpable.  Thyroid  bed within normal limits to palpation.  Parotid glands and submandibular glands equal bilaterally without mass.  Trachea is midline. Neuro:  CN 2-12 grossly intact.    Assessment and Plan Assessment & Plan Chronic sinusitis Chronic sinusitis with recurrent episodes over the past year, currently without evidence of acute infection on examination. Nasal breathing and CPAP tolerance have improved following humidification adjustment. - Ordered CT scan of the sinuses to evaluate for chronic rhinosinusitis. - Recommended continued use of saline irrigation. - Advised continued use of Flonase  nasal spray daily. - Scheduled follow-up in one month to review CT scan results and reassess management.  OSA, on CPAP machine Intermittent nasal dryness and irritation, particularly with CPAP use and cold air exposure, effectively managed with saline spray, irrigation, and Flonase . - Recommended continued use of saline spray and irrigation. - Advised continued use of Flonase  nasal spray.

## 2024-03-25 ENCOUNTER — Telehealth: Payer: Self-pay | Admitting: Pulmonary Disease

## 2024-03-25 NOTE — Telephone Encounter (Signed)
 Left voicemail to move appointment up between 04/24/2024 & 06/22/2024 for cpap compliance.

## 2024-03-31 ENCOUNTER — Ambulatory Visit: Payer: Self-pay | Admitting: Nurse Practitioner

## 2024-04-01 NOTE — Telephone Encounter (Signed)
 Patient is scheduled 06/18/2024.

## 2024-04-02 ENCOUNTER — Ambulatory Visit

## 2024-04-08 ENCOUNTER — Ambulatory Visit

## 2024-04-15 ENCOUNTER — Ambulatory Visit: Admitting: Internal Medicine

## 2024-04-16 ENCOUNTER — Ambulatory Visit

## 2024-04-16 ENCOUNTER — Ambulatory Visit (HOSPITAL_COMMUNITY)

## 2024-04-22 ENCOUNTER — Ambulatory Visit (INDEPENDENT_AMBULATORY_CARE_PROVIDER_SITE_OTHER): Admitting: Otolaryngology

## 2024-06-18 ENCOUNTER — Ambulatory Visit (HOSPITAL_BASED_OUTPATIENT_CLINIC_OR_DEPARTMENT_OTHER)

## 2024-06-30 ENCOUNTER — Ambulatory Visit (HOSPITAL_BASED_OUTPATIENT_CLINIC_OR_DEPARTMENT_OTHER): Admitting: Pulmonary Disease

## 2024-07-01 ENCOUNTER — Ambulatory Visit: Payer: Self-pay
